# Patient Record
Sex: Female | Born: 1960 | Race: White | Hispanic: No | Marital: Married | Smoking: Never smoker
Health system: Southern US, Community
[De-identification: ages and names within clinical notes are randomized; demographics above are authoritative.]

## PROBLEM LIST (undated history)

## (undated) DIAGNOSIS — I4719 Other supraventricular tachycardia: Secondary | ICD-10-CM

## (undated) DIAGNOSIS — Z91018 Allergy to other foods: Secondary | ICD-10-CM

## (undated) DIAGNOSIS — M549 Dorsalgia, unspecified: Secondary | ICD-10-CM

## (undated) DIAGNOSIS — S060X9A Concussion with loss of consciousness of unspecified duration, initial encounter: Secondary | ICD-10-CM

## (undated) DIAGNOSIS — G43909 Migraine, unspecified, not intractable, without status migrainosus: Secondary | ICD-10-CM

## (undated) DIAGNOSIS — I341 Nonrheumatic mitral (valve) prolapse: Secondary | ICD-10-CM

## (undated) DIAGNOSIS — G9332 Myalgic encephalomyelitis/chronic fatigue syndrome: Secondary | ICD-10-CM

## (undated) DIAGNOSIS — Z9889 Other specified postprocedural states: Secondary | ICD-10-CM

## (undated) DIAGNOSIS — I2510 Atherosclerotic heart disease of native coronary artery without angina pectoris: Secondary | ICD-10-CM

## (undated) DIAGNOSIS — R112 Nausea with vomiting, unspecified: Secondary | ICD-10-CM

## (undated) DIAGNOSIS — I8390 Asymptomatic varicose veins of unspecified lower extremity: Secondary | ICD-10-CM

## (undated) DIAGNOSIS — R51 Headache: Secondary | ICD-10-CM

## (undated) DIAGNOSIS — F32A Depression, unspecified: Secondary | ICD-10-CM

## (undated) DIAGNOSIS — R55 Syncope and collapse: Secondary | ICD-10-CM

## (undated) DIAGNOSIS — G4700 Insomnia, unspecified: Secondary | ICD-10-CM

## (undated) DIAGNOSIS — M79606 Pain in leg, unspecified: Secondary | ICD-10-CM

## (undated) DIAGNOSIS — K589 Irritable bowel syndrome without diarrhea: Secondary | ICD-10-CM

## (undated) DIAGNOSIS — R109 Unspecified abdominal pain: Secondary | ICD-10-CM

## (undated) DIAGNOSIS — N3010 Interstitial cystitis (chronic) without hematuria: Secondary | ICD-10-CM

## (undated) DIAGNOSIS — M797 Fibromyalgia: Secondary | ICD-10-CM

## (undated) DIAGNOSIS — M542 Cervicalgia: Secondary | ICD-10-CM

## (undated) DIAGNOSIS — F419 Anxiety disorder, unspecified: Secondary | ICD-10-CM

## (undated) DIAGNOSIS — J069 Acute upper respiratory infection, unspecified: Secondary | ICD-10-CM

## (undated) DIAGNOSIS — M79673 Pain in unspecified foot: Secondary | ICD-10-CM

## (undated) DIAGNOSIS — E739 Lactose intolerance, unspecified: Secondary | ICD-10-CM

## (undated) DIAGNOSIS — K5900 Constipation, unspecified: Secondary | ICD-10-CM

## (undated) DIAGNOSIS — F329 Major depressive disorder, single episode, unspecified: Secondary | ICD-10-CM

## (undated) DIAGNOSIS — K219 Gastro-esophageal reflux disease without esophagitis: Secondary | ICD-10-CM

## (undated) DIAGNOSIS — R110 Nausea: Secondary | ICD-10-CM

## (undated) DIAGNOSIS — G2581 Restless legs syndrome: Secondary | ICD-10-CM

## (undated) DIAGNOSIS — K5190 Ulcerative colitis, unspecified, without complications: Secondary | ICD-10-CM

## (undated) DIAGNOSIS — I493 Ventricular premature depolarization: Secondary | ICD-10-CM

## (undated) DIAGNOSIS — R5382 Chronic fatigue, unspecified: Secondary | ICD-10-CM

## (undated) HISTORY — DX: Ulcerative colitis, unspecified, without complications: K51.90

## (undated) HISTORY — DX: Constipation, unspecified: K59.00

## (undated) HISTORY — DX: Unspecified abdominal pain: R10.9

## (undated) HISTORY — PX: NASAL SEPTUM SURGERY: SHX37

## (undated) HISTORY — DX: Acute upper respiratory infection, unspecified: J06.9

## (undated) HISTORY — DX: Gastro-esophageal reflux disease without esophagitis: K21.9

## (undated) HISTORY — DX: Syncope and collapse: R55

## (undated) HISTORY — DX: Allergy to other foods: Z91.018

## (undated) HISTORY — DX: Fibromyalgia: M79.7

## (undated) HISTORY — DX: Pain in leg, unspecified: M79.606

## (undated) HISTORY — DX: Myalgic encephalomyelitis/chronic fatigue syndrome: G93.32

## (undated) HISTORY — DX: Cervicalgia: M54.2

## (undated) HISTORY — PX: LASIK: SHX215

## (undated) HISTORY — DX: Other supraventricular tachycardia: I47.19

## (undated) HISTORY — DX: Pain in unspecified foot: M79.673

## (undated) HISTORY — DX: Nausea: R11.0

## (undated) HISTORY — DX: Chronic fatigue, unspecified: R53.82

## (undated) HISTORY — DX: Lactose intolerance, unspecified: E73.9

## (undated) HISTORY — DX: Asymptomatic varicose veins of unspecified lower extremity: I83.90

## (undated) HISTORY — PX: ESOPHAGOGASTRODUODENOSCOPY ENDOSCOPY: SHX5814

## (undated) HISTORY — DX: Nonrheumatic mitral (valve) prolapse: I34.1

## (undated) HISTORY — PX: BLADDER SURGERY: SHX569

## (undated) HISTORY — DX: Ventricular premature depolarization: I49.3

## (undated) HISTORY — DX: Dorsalgia, unspecified: M54.9

## (undated) HISTORY — PX: REDUCTION MAMMAPLASTY: SUR839

## (undated) HISTORY — DX: Concussion with loss of consciousness of unspecified duration, initial encounter: S06.0X9A

## (undated) HISTORY — DX: Migraine, unspecified, not intractable, without status migrainosus: G43.909

## (undated) HISTORY — DX: Atherosclerotic heart disease of native coronary artery without angina pectoris: I25.10

## (undated) HISTORY — PX: VASCULAR SURGERY: SHX849

## (undated) HISTORY — DX: Insomnia, unspecified: G47.00

---

## 1998-05-03 ENCOUNTER — Ambulatory Visit (HOSPITAL_BASED_OUTPATIENT_CLINIC_OR_DEPARTMENT_OTHER): Admission: RE | Admit: 1998-05-03 | Discharge: 1998-05-03 | Payer: Self-pay

## 1999-08-22 ENCOUNTER — Encounter: Admission: RE | Admit: 1999-08-22 | Discharge: 1999-08-22 | Payer: Self-pay | Admitting: Gynecology

## 1999-08-22 ENCOUNTER — Encounter: Payer: Self-pay | Admitting: Gynecology

## 2001-08-16 ENCOUNTER — Other Ambulatory Visit: Admission: RE | Admit: 2001-08-16 | Discharge: 2001-08-16 | Payer: Self-pay | Admitting: Obstetrics and Gynecology

## 2001-12-11 ENCOUNTER — Encounter: Payer: Self-pay | Admitting: Obstetrics and Gynecology

## 2001-12-11 ENCOUNTER — Encounter: Admission: RE | Admit: 2001-12-11 | Discharge: 2001-12-11 | Payer: Self-pay | Admitting: Obstetrics and Gynecology

## 2002-08-20 ENCOUNTER — Other Ambulatory Visit: Admission: RE | Admit: 2002-08-20 | Discharge: 2002-08-20 | Payer: Self-pay | Admitting: Obstetrics and Gynecology

## 2003-05-13 ENCOUNTER — Encounter: Admission: RE | Admit: 2003-05-13 | Discharge: 2003-05-13 | Payer: Self-pay | Admitting: Obstetrics and Gynecology

## 2003-09-11 ENCOUNTER — Other Ambulatory Visit: Admission: RE | Admit: 2003-09-11 | Discharge: 2003-09-11 | Payer: Self-pay | Admitting: Obstetrics and Gynecology

## 2004-05-30 ENCOUNTER — Ambulatory Visit (HOSPITAL_COMMUNITY): Admission: RE | Admit: 2004-05-30 | Discharge: 2004-05-30 | Payer: Self-pay | Admitting: Obstetrics and Gynecology

## 2004-12-05 ENCOUNTER — Other Ambulatory Visit: Admission: RE | Admit: 2004-12-05 | Discharge: 2004-12-05 | Payer: Self-pay | Admitting: Obstetrics and Gynecology

## 2005-06-30 ENCOUNTER — Encounter: Admission: RE | Admit: 2005-06-30 | Discharge: 2005-06-30 | Payer: Self-pay | Admitting: Neurology

## 2006-01-18 ENCOUNTER — Other Ambulatory Visit: Admission: RE | Admit: 2006-01-18 | Discharge: 2006-01-18 | Payer: Self-pay | Admitting: Obstetrics & Gynecology

## 2006-01-18 ENCOUNTER — Ambulatory Visit (HOSPITAL_COMMUNITY): Admission: RE | Admit: 2006-01-18 | Discharge: 2006-01-18 | Payer: Self-pay | Admitting: Obstetrics and Gynecology

## 2006-01-24 ENCOUNTER — Encounter: Admission: RE | Admit: 2006-01-24 | Discharge: 2006-01-24 | Payer: Self-pay | Admitting: Obstetrics and Gynecology

## 2006-08-08 ENCOUNTER — Ambulatory Visit: Payer: Self-pay | Admitting: Internal Medicine

## 2006-08-09 ENCOUNTER — Ambulatory Visit: Payer: Self-pay | Admitting: Professional

## 2007-02-05 ENCOUNTER — Other Ambulatory Visit: Admission: RE | Admit: 2007-02-05 | Discharge: 2007-02-05 | Payer: Self-pay | Admitting: Obstetrics & Gynecology

## 2008-03-25 ENCOUNTER — Ambulatory Visit (HOSPITAL_COMMUNITY): Admission: RE | Admit: 2008-03-25 | Discharge: 2008-03-25 | Payer: Self-pay | Admitting: Obstetrics and Gynecology

## 2008-03-25 ENCOUNTER — Other Ambulatory Visit: Admission: RE | Admit: 2008-03-25 | Discharge: 2008-03-25 | Payer: Self-pay | Admitting: Obstetrics & Gynecology

## 2009-09-21 ENCOUNTER — Encounter: Payer: Self-pay | Admitting: Pulmonary Disease

## 2009-10-05 DIAGNOSIS — Z87898 Personal history of other specified conditions: Secondary | ICD-10-CM | POA: Insufficient documentation

## 2009-10-05 DIAGNOSIS — F329 Major depressive disorder, single episode, unspecified: Secondary | ICD-10-CM | POA: Insufficient documentation

## 2009-10-05 DIAGNOSIS — J309 Allergic rhinitis, unspecified: Secondary | ICD-10-CM | POA: Insufficient documentation

## 2009-10-05 DIAGNOSIS — N3010 Interstitial cystitis (chronic) without hematuria: Secondary | ICD-10-CM | POA: Insufficient documentation

## 2009-10-05 DIAGNOSIS — F3289 Other specified depressive episodes: Secondary | ICD-10-CM | POA: Insufficient documentation

## 2009-10-05 DIAGNOSIS — G4700 Insomnia, unspecified: Secondary | ICD-10-CM | POA: Insufficient documentation

## 2009-10-05 DIAGNOSIS — I8390 Asymptomatic varicose veins of unspecified lower extremity: Secondary | ICD-10-CM | POA: Insufficient documentation

## 2009-10-06 ENCOUNTER — Ambulatory Visit: Payer: Self-pay | Admitting: Pulmonary Disease

## 2009-10-06 DIAGNOSIS — G4700 Insomnia, unspecified: Secondary | ICD-10-CM | POA: Insufficient documentation

## 2009-10-06 DIAGNOSIS — F518 Other sleep disorders not due to a substance or known physiological condition: Secondary | ICD-10-CM | POA: Insufficient documentation

## 2009-11-16 ENCOUNTER — Encounter: Payer: Self-pay | Admitting: Pulmonary Disease

## 2009-11-16 ENCOUNTER — Ambulatory Visit (HOSPITAL_BASED_OUTPATIENT_CLINIC_OR_DEPARTMENT_OTHER): Admission: RE | Admit: 2009-11-16 | Discharge: 2009-11-16 | Payer: Self-pay | Admitting: Pulmonary Disease

## 2009-11-20 ENCOUNTER — Ambulatory Visit: Payer: Self-pay | Admitting: Pulmonary Disease

## 2009-11-23 ENCOUNTER — Telehealth (INDEPENDENT_AMBULATORY_CARE_PROVIDER_SITE_OTHER): Payer: Self-pay | Admitting: *Deleted

## 2009-12-03 ENCOUNTER — Ambulatory Visit: Payer: Self-pay | Admitting: Pulmonary Disease

## 2009-12-07 ENCOUNTER — Encounter: Admission: RE | Admit: 2009-12-07 | Discharge: 2009-12-07 | Payer: Self-pay | Admitting: Neurology

## 2009-12-11 DIAGNOSIS — G2589 Other specified extrapyramidal and movement disorders: Secondary | ICD-10-CM | POA: Insufficient documentation

## 2009-12-11 DIAGNOSIS — G4761 Periodic limb movement disorder: Secondary | ICD-10-CM | POA: Insufficient documentation

## 2009-12-29 ENCOUNTER — Telehealth (INDEPENDENT_AMBULATORY_CARE_PROVIDER_SITE_OTHER): Payer: Self-pay | Admitting: *Deleted

## 2010-01-31 ENCOUNTER — Telehealth (INDEPENDENT_AMBULATORY_CARE_PROVIDER_SITE_OTHER): Payer: Self-pay | Admitting: *Deleted

## 2010-05-11 ENCOUNTER — Ambulatory Visit: Payer: Self-pay | Admitting: Pulmonary Disease

## 2010-05-19 ENCOUNTER — Ambulatory Visit (HOSPITAL_COMMUNITY)
Admission: RE | Admit: 2010-05-19 | Discharge: 2010-05-19 | Payer: Self-pay | Source: Home / Self Care | Attending: Obstetrics and Gynecology | Admitting: Obstetrics and Gynecology

## 2010-06-04 ENCOUNTER — Encounter: Payer: Self-pay | Admitting: Obstetrics and Gynecology

## 2010-06-05 ENCOUNTER — Encounter: Payer: Self-pay | Admitting: Obstetrics and Gynecology

## 2010-06-16 NOTE — Assessment & Plan Note (Signed)
Summary: consult for insomnia, sleep issues.   Copy to:  Meredith Staggers (psychiatry) Primary Provider/Referring Provider:  Fara Chute  CC:  Sleep Consult.  History of Present Illness: the patient is a 50 year old female who I have been asked to see for significant sleep issues. The patient has underlying major depression and also generalized anxiety disorder, and gives a history for ongoing sleep issues for the last 20 years. She describes her problem as difficulty with sleep onset and maintenance, and feels that it is getting worse. She has tried various behavioral therapies in the past, but has never been treated with formal cognitive behavioral therapy. She has been tried on numerous sleeping medications without success. She currently is on trazodone 50-100 mg at bedtime. She will typically go to bed between 9 and 10 PM, but either watches television in bed or reads in bed until she is able to fall asleep. That may take anywhere from 15 minutes to 2 hours. Once asleep, she will have one to multiple awakenings, and it may take 15 minutes to hours to fall back asleep. She describes various cycles in which she would sleep better for a period of time, followed by worsening. Her husband states that she definitely snores, and she can awaken herself with the snoring. She has also been noted to have kicking according to the husband, and describes symptoms consistent with restless leg syndrome in the evening before bed. She has been treated for RLS in the past, and she is not sure why she stopped taking the medication. She awakens at 6 AM to start her day, and does not feel rested. She does not have any caffeine intake during the day, but does have severe fatigue throughout the day. She does not describe classic sleepiness except at various times. She can however, fall asleep with television in the evening, and her husband may go to bed and leave her sleeping on the sofa. On the weekend she spends all day in bed,  and feels that she only sleeps short periods. The patient admits to having a sense of frustration about going to sleep, and feels that her mind is racing and keeps her from initiating sleep.  Preventive Screening-Counseling & Management  Alcohol-Tobacco     Smoking Status: never  Current Medications (verified): 1)  Ambi 40pse/400gfn 40-400 Mg Tabs (Pseudoephedrine-Guaifenesin) .... Take 1/2 To 1 Tab By Mouth As Directed 2)  Fluticasone Propionate 50 Mcg/act Susp (Fluticasone Propionate) .... 2 Sprays in Each Nostril Once Daily 3)  Doxycycline Hyclate 100 Mg Caps (Doxycycline Hyclate) .... Take 1 Tablet By Mouth Once A Day As Directed By Dermatologist 4)  Zyrtec Allergy 10 Mg Tabs (Cetirizine Hcl) .... Take 1 Tab By Mouth At Bedtime 5)  Vitamin D2 1.25 .... Take Once A Week 6)  Elmiron 100 Mg Caps (Pentosan Polysulfate Sodium) .... Take 1 Tablet By Mouth Two Times A Day 7)  Fluoxetine Hcl 10 Mg Caps (Fluoxetine Hcl) .... Take 1 Tablet By Mouth Once A Day 8)  Junel Birth Control Pill .... Use As Directed 9)  Percocet 10-325 Mg Tabs (Oxycodone-Acetaminophen) .... Take By Mouth As Needed For Migraines 10)  Migranal 4 Mg/ml Soln (Dihydroergotamine Mesylate) .... Use Spray As Directed As Needed For Migraines  Allergies (verified): No Known Drug Allergies  Past History:  Past Medical History:  generalized anxiety disorder VARICOSE VEINS, LOWER EXTREMITIES (ICD-454.9) MIGRAINES, HX OF (ICD-V13.8) INTERSTITIAL CYSTITIS (ICD-595.1) INSOMNIA UNSPECIFIED (ICD-780.52) DEPRESSION (ICD-311) ALLERGIC RHINITIS (ICD-477.9)  Past Surgical History: Sinus surgery  2007 and 02/2009  Family History: Reviewed history from 10/05/2009 and no changes required. Family History Depression---father Sleep problems---father Anxiety---mother allergies: father heart disease: mother   Social History: Reviewed history and no changes required. Patient never smoked.  pt is married. pt has children- 2  sons pt works as a Advice worker. Smoking Status:  never  Review of Systems       The patient complains of weight change, headaches, nasal congestion/difficulty breathing through nose, anxiety, depression, and joint stiffness or pain.  The patient denies shortness of breath with activity, shortness of breath at rest, productive cough, non-productive cough, coughing up blood, chest pain, irregular heartbeats, acid heartburn, indigestion, loss of appetite, abdominal pain, difficulty swallowing, sore throat, tooth/dental problems, sneezing, itching, ear ache, hand/feet swelling, rash, change in color of mucus, and fever.    Vital Signs:  Patient profile:   50 year old female Height:      65.5 inches Weight:      165 pounds BMI:     27.14 O2 Sat:      99 % on Room air Temp:     98.6 degrees F oral Pulse rate:   77 / minute BP sitting:   100 / 64  (left arm) Cuff size:   regular  Vitals Entered By: Arman Filter LPN (Oct 06, 2009 3:09 PM)  O2 Flow:  Room air CC: Sleep Consult Comments Medications reviewed with patient Arman Filter LPN  Oct 06, 2009 3:26 PM    Physical Exam  General:  overweight female in no acute distress Eyes:  PERRLA and EOMI.   Nose:  patent without discharge Mouth:  mild elongation of the soft palate and uvula Neck:  no JVD, thyromegaly, or lymphadenopathy area and Lungs:  totally clear to auscultation Heart:  regular rate and rhythm, no MRG Abdomen:  soft and nontender, bowel sounds present Extremities:  no edema or cyanosis noted. Pulses intact distally Neurologic:  alert and oriented, does not appear overly sleepy, moves all 4 extremities.   Impression & Recommendations:  Problem # 1:  PERSISTENT DISORDER INITIATING/MAINTAINING SLEEP (ICD-307.42) the patient has significant sleep onset and maintenance insomnia but I suspect is multifactorial. She has underlying major depression and generalized anxiety disorder, has significant sleep hygiene issues,  has a definite component of psychophysiologic insomnia, and the question remains whether she has some type of sleep disorder that is contributing to the problem. She does have a history of snoring with arousals, but doesn't have other history that is supportive of this. It does sound like she has the restless leg syndrome, and may benefit from treatment. She is very interested in ruling out other sleep disorders outside of insomnia, and feels that she can sleep long enough to get an adequate sleep study. I have had a long discussion with her regarding stimulus control therapy, and have asked her to try this in the interim. I have gone over this with her in detail. If she continues to have issues, I would recommend that she have cognitive behavioral therapy with someone who is trained in this discipline.  Problem # 2:  INADEQUATE SLEEP HYGIENE (ICD-307.49) the patient has developed a lot of bad habits which are contributing to her sleep issues. She is reading and watching TV in bed while trying to go to sleep, she is staying in bed despite the inability to sleep, she is catnapping during the day and in the evening at times, and is staying in bed throughout the day on the  weekends. I have gone over the basics of a good sleep hygiene regimen with her.  Medications Added to Medication List This Visit: 1)  Ambi 40pse/400gfn 40-400 Mg Tabs (Pseudoephedrine-guaifenesin) .... Take 1/2 to 1 tab by mouth as directed 2)  Fluticasone Propionate 50 Mcg/act Susp (Fluticasone propionate) .... 2 sprays in each nostril once daily 3)  Doxycycline Hyclate 100 Mg Caps (Doxycycline hyclate) .... Take 1 tablet by mouth once a day as directed by dermatologist 4)  Zyrtec Allergy 10 Mg Tabs (Cetirizine hcl) .... Take 1 tab by mouth at bedtime 5)  Vitamin D2 1.25  .... Take once a week 6)  Elmiron 100 Mg Caps (Pentosan polysulfate sodium) .... Take 1 tablet by mouth two times a day 7)  Fluoxetine Hcl 10 Mg Caps (Fluoxetine hcl)  .... Take 1 tablet by mouth once a day 8)  Junel Birth Control Pill  .... Use as directed 9)  Percocet 10-325 Mg Tabs (Oxycodone-acetaminophen) .... Take by mouth as needed for migraines 10)  Migranal 4 Mg/ml Soln (Dihydroergotamine mesylate) .... Use spray as directed as needed for migraines  Other Orders: Consultation Level V (16109) Sleep Disorder Referral (Sleep Disorder)  Patient Instructions: 1)  will set up for sleep study to evaluate for sleep apnea or restless legs. 2)  try stimulus control therapy as discussed with you. 3)  work on sleep hygiene as discussed with you. 4)  will see you back after sleep study to check on progress.

## 2010-06-16 NOTE — Progress Notes (Signed)
Summary: requip rx   Phone Note Call from Patient Call back at [phone] ex [phone]   Caller: Patient Call For: clance Summary of Call: calling for prescript for ropinirole. she states it is working well [facility] [phone] Initial call taken by: Rickard Patience,  January 31, 2010 9:49 AM  Follow-up for Phone Call        Per phone note from 8.17.11, mirapex was changed to requip -- pt to call back in 3 wks with report.  ATC above number - [facility] Crystal Yetta Barre RN  January 31, 2010 10:21 AM   pt states requip is working very well, she is not having any more leg symptoms and is requesting a refill on requip. Carron Curie CMA  January 31, 2010 11:20 AM   Additional Follow-up for Phone Call Additional follow up Details #1::        ok to call in prescription for: requip 0.5mg  2 after dinner each night...88fills. have her followup with me in about 2mos. Additional Follow-up by: Barbaraann Share MD,  January 31, 2010 4:45 PM    Additional Follow-up for Phone Call Additional follow up Details #2::    ATC pt at alternate #.  NA and unable to leave message.  Therefore called pt at home # and [facility].  Aundra Millet Reynolds LPN  January 31, 2010 4:59 PM   Called, spoke with pt.  She is aware requip rx sent to [facility] in [place].  2 month follow up scheduled with KC for 11.18.11 at 3:45 pm -- pt aware.  Follow-up by: Gweneth Dimitri RN,  February 01, 2010 9:57 AM  Prescriptions: ROPINIROLE HCL 0.5 MG TABS (ROPINIROLE HCL) 2 after dinner each night  #60 x 6   Entered by:   Gweneth Dimitri RN   Authorized by:   Barbaraann Share MD   Signed by:   Gweneth Dimitri RN on 02/01/2010   Method used:   Electronically to        [facility] # [phone]* (retail)       [address]       [place], [place]  [address]       Ph: [phone] or [phone]       Fax: [phone]   RxID:   [id number]

## 2010-06-16 NOTE — Progress Notes (Signed)
Summary: need to sched ov with kc -pt returned call  Phone Note Outgoing Call Call back at [facility] Phone [phone]   Call placed by: Arman Filter LPN,  November 23, 2009 4:09 PM Call placed to: Patient Summary of Call: pt needs ov with kc to discuss sleep study results.[name]  [email or web address].  Aundra Millet Analicia Skibinski LPN  November 23, 2009 4:09 PM   Returning call, can be reached [phone]. Darletta Moll  November 25, 2009 2:24 PM  Initial call taken by: Arman Filter LPN,  November 23, 2009 4:09 PM  Follow-up for Phone Call        [facility]  KC's schedule is VERY full next week.  Only opening I have for her at this time is 12-03-2009 at 4:15pm.  If that doesn't work for her,  then next avail is all I can offer.  Aundra Millet Yong Wahlquist LPN  November 26, 2009 10:51 AM   Additional Follow-up for Phone Call Additional follow up Details #1::        [email or web address].  Aundra Millet Samari Gorby LPN  November 29, 2009 3:41 PM   pt returned call. she is on schedule for this fri 7/22 at 4:15. Tivis Ringer, CNA  November 30, 2009 9:53 AM

## 2010-06-16 NOTE — Consult Note (Signed)
Summary: [facility]  [facility]   Imported By: Sherian Rein 02/02/2010 09:01:48  _____________________________________________________________________  External Attachment:    Type:   Image     Comment:   External Document
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
## 2010-06-16 NOTE — Assessment & Plan Note (Signed)
Summary: f/u sleep study results- ok per megan//kp   Copy to:  Meredith Staggers (psychiatry) Primary Provider/Referring Provider:  Fara Chute  CC:  Pt is here for a f/u appt to discuss sleep study results.  .  History of Present Illness: the pt comes in today for f/u of her recent sleep study.  She was found to have no significant SDB, mild to moderate snoring, and very large numbers of leg jerks with signfiicant sleep disruption.  She had 195 PLMS, with 6/hr resulting in arousal or awakening.  I have reviewed the study with her and her husband, and answered all of their questions.  Current Medications (verified): 1)  Ambi 40pse/400gfn 40-400 Mg Tabs (Pseudoephedrine-Guaifenesin) .... Take 1/2 To 1 Tab By Mouth As Directed 2)  Zyrtec Allergy 10 Mg Tabs (Cetirizine Hcl) .... Take 1 Tab By Mouth At Bedtime 3)  Vitamin D2 1.25 .... Take Once A Week 4)  Elmiron 100 Mg Caps (Pentosan Polysulfate Sodium) .... Take 1 Tablet By Mouth Two Times A Day 5)  Junel Birth Control Pill .... Use As Directed 6)  Percocet 10-325 Mg Tabs (Oxycodone-Acetaminophen) .... Take By Mouth As Needed For Migraines 7)  Migranal 4 Mg/ml Soln (Dihydroergotamine Mesylate) .... Use Spray As Directed As Needed For Migraines 8)  Mirapex 0.125 Mg  Tabs (Pramipexole Dihydrochloride) .... As Directed 9)  Trazodone Hcl 50 Mg Tabs (Trazodone Hcl) .... Take 1 Tab By Mouth At Bedtime  Allergies (verified): No Known Drug Allergies  Review of Systems  The patient denies shortness of breath with activity, shortness of breath at rest, productive cough, non-productive cough, coughing up blood, chest pain, irregular heartbeats, acid heartburn, indigestion, loss of appetite, weight change, abdominal pain, difficulty swallowing, sore throat, tooth/dental problems, headaches, nasal congestion/difficulty breathing through nose, sneezing, itching, ear ache, anxiety, depression, hand/feet swelling, joint stiffness or pain, rash, change in color  of mucus, and fever.    Vital Signs:  Patient profile:   50 year old female Height:      65.5 inches Weight:      168.38 pounds BMI:     27.69 O2 Sat:      95 % on Room air Temp:     98.3 degrees F oral Pulse rate:   62 / minute BP sitting:   110 / 72  (left arm) Cuff size:   regular  Vitals Entered By: Arman Filter LPN (December 03, 2009 3:25 PM)  O2 Flow:  Room air CC: Pt is here for a f/u appt to discuss sleep study results.   Comments Medications reviewed with patient Arman Filter LPN  December 03, 2009 3:26 PM    Physical Exam  General:  ow female in nad Extremities:  no edema or cyanosis Neurologic:  alert, does not appear sleepy, moves all 4.   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  I would like to start her on a dopamine agonist, and see how she does.  Will check an iron panel at some point.    Problem # 2:  PERSISTENT DISORDER INITIATING/MAINTAINING SLEEP (ICD-307.42)  I have reminded the pt that her leg movements are not her only sleep issue.  She will need to continue to work on her behavioral therapies and sleep hygiene while treating her movement disorder of sleep.    Medications Added to Medication List This Visit: 1)  Mirapex  0.125 Mg Tabs (Pramipexole dihydrochloride) .... As directed 2)  Trazodone Hcl 50 Mg Tabs (Trazodone hcl) .... Take 1 tab by mouth at bedtime  Other Orders: Est. Patient Level III (16109)  Patient Instructions: 1)  continue to work on behavior modifications as we have discussed. 2)  will try mirapex 0.125mg  one after dinner each night for one week, then increase to two each night.  Let me know if issues with tolerance. 3)  please call me in 3 weeks to give update on progress.  Prescriptions: MIRAPEX 0.125 MG  TABS (PRAMIPEXOLE DIHYDROCHLORIDE) as directed  #60 x 6   Entered and Authorized  by:   Barbaraann Share MD   Signed by:   Barbaraann Share MD on 12/03/2009   Method used:   Print then Give to Patient   RxID:   [id number]

## 2010-06-16 NOTE — Letter (Signed)
Summary: [facility]  [facility]   Imported By: Lester Atmore 10/14/2009 11:54:23  _____________________________________________________________________  External Attachment:    Type:   Image     Comment:   External Document

## 2010-06-16 NOTE — Progress Notes (Signed)
Summary: change mirapex to requip 0.5mg   Call back at Home Phone [phone] Call back at Work Phone [phone] Call back at [phone] work   Caller: Patient Call For: clance Reason for Call: Talk to Nurse Summary of Call: Not doing any better, taiking Mirapex 2 at night, husband says still very restless, sleep until 12 midnight then is up every hour, take 4 topomax at night, then wake up every hour on the hour. again.  Please advise. Initial call taken by: Eugene Gavia,  December 29, 2009 9:53 AM  Follow-up for Phone Call       Follow-up by: Barbaraann Share MD,  December 29, 2009 5:33 PM    Additional Follow-up for Phone Call Additional follow up Details #2::    called and talked with pt.  She is still having leg symptoms despite taking mirapex.  Will try requip in its place.  She has multiple sleep issues that need to be addressed by psychiatry, ?neurology  please ask pt to stop mirapex and start requip 0.5mg   2 after dinner each night.  #40, no fills.  call me with progress in 3 weeks. Follow-up by: Barbaraann Share MD,  December 29, 2009 5:35 PM  Additional Follow-up for Phone Call Additional follow up Details #3:: Details for Additional Follow-up Action Taken: [facility] at work number-was unavailable at the time.Reynaldo Minium CMA  December 30, 2009 8:56 AM   lm at wk # and other number listed wk the number listed as home just gave a fast busy signal   Philipp Deputy CMA  December 30, 2009 11:16 AM   called home #, LMOM TCB.  called 1st work #, LMOM TCB.  called the 3rd # provided, was told to call [phone].  called this # and was able to speak with patient.  she verified that she has indeed spoken with Dr. Shelle Iron but i reiterated the dosing instructions for the requip.  rx sent to pt's verified pharmacy.  pt verbalized her understanding and will call back in 3weeks' time with progress report.  pt also aware to disregard the messages i left on the other 2 numbers. Boone Master CNA/MA  December 31, 2009 3:39 PM   New/Updated Medications: ROPINIROLE HCL 0.5 MG TABS (ROPINIROLE HCL) 2 after dinner each night Prescriptions: ROPINIROLE HCL 0.5 MG TABS (ROPINIROLE HCL) 2 after dinner each night  #40 x 0   Entered by:   Boone Master CNA/MA   Authorized by:   Barbaraann Share MD   Signed by:   Boone Master CNA/MA on 12/31/2009   Method used:   Electronically to        [facility] # [phone]* (retail)       [address]       [place], [place]  [address]       Ph: [phone] or [phone]       Fax: [phone]   RxID:   [id number]

## 2010-08-30 ENCOUNTER — Other Ambulatory Visit: Payer: Self-pay | Admitting: Pulmonary Disease

## 2010-09-30 ENCOUNTER — Telehealth: Payer: Self-pay | Admitting: Pulmonary Disease

## 2010-09-30 NOTE — Assessment & Plan Note (Signed)
[facility] [place] [facility]                             PULMONARY OFFICE NOTE   Marisa Hamilton, Marisa Hamilton                        MRN:          [id number]  DATE:08/08/2006                            DOB:          20-Dec-1960    NEW SLEEP MEDICINE CONSULTATION.   PROBLEM:  A 50 year old woman referred through the courtesy of Dr.  Julieta Hamilton office, in Sleep Medicine consultation, complaining of  inability to initiate and maintain sleep.   HISTORY:  She says beginning about 19 years ago with pregnancy a  previously unremarkable sleep pattern became difficult with chronic  insomnia.  She had difficulty initiating sleep and difficulty staying  asleep with a tendency to wake up several times during the night, unable  to get back to sleep again.  She feels that her mind will not turn off.  Husband is with her and comments that she likes to leave the t.v.  She  seems to use it as a background noise.  For about 8 years she has tried  multiple medications.  Most recently she is using trazodone 50 mg, now  reduced to 25 mg at h.s.  This helps her maintain sleep at night but  gives morning hangover.  She feels unrested.  Most recently she tried  Rozerem and says she sleeps well with that but feels sleepy and out of  touch all the next day.  She says she is not able to make time to nap.  Usual bedtime is between 9 and 10 p.m., estimating 30 minute sleep onset  and waking 3-5 times during the night before finally up at 6 a.m.  Her  husband says she snores a little occasionally and may twitch or jerk  occasionally.  Neither of these seems to be a big problem.   MEDICATION:  1. Yaz.  2. Elmiron.  3. Prozac 10 mg.  4. Topamax 50 mg at h.s.  5. Trazodone 25 mg.  6. Rozerem on weekends.   NO MEDICATION ALLERGY.   REVIEW OF SYSTEMS:  Weight has gone up and down, saying she gained 25  pounds and lost 10 in the past 2 years.   No tobacco, occasional social alcohol, no caffeine at all,  she is  married with children.  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  She may sleep a little better making this change  and has much less frequent migraines.   FAMILY HISTORY:  1. Father had depression and sleep problems similar to herself.  2. Son also has a similar difficulty with light sleep, easily      disturbed.  3. Mother is described as anxious.   REVIEW OF SYSTEMS:  Snoring, difficulty initiating sleep, some creeping  in legs before going to sleep, daytime sleepiness if inactive, leg or  body jerks, waking fatigue.  She says here that she takes nap in the  day, but when directly asked she says she does not.  Vivid dreams which  she describes  as always unpleasant.  Depression.  Bruxism.  She feels  physically comfortable in bed.  She is not menopausal.   PAST HISTORY:  1. Thyroid tested normal.  2. Previous allergic rhinitis.  3. Migraine.  4. Sinus surgery.  5. Interstitial cystitis.  6. Depression.  7. Varicose veins.  8. Difficultly concentrating.  9. Menopausal hot flashes.   OBJECTIVE:  Weight 145 pounds, BP 104/62, pulse regularly 79, room air  saturation 100%.  She appears as a trim, alert, pleasant woman  appropriately interactive.  SKIN:  No obvious rash.  ADENOPATHY:  None seen.  HEENT:  Eyes, nose and throat are clear, palate spacing 2/4, voice  quality is normal, she can breathe comfortably through her nose with her  mouth closed, there is no thyromegaly or neck vein distention.  CHEST:  Quiet, clear, unlabored breathing.  HEART SOUNDS:  Regular without murmur or gallop.  ABDOMEN:  No hepatosplenomegaly.  EXTREMITIES:  No cyanosis, clubbing, edema or tremor.  NEUROLOGIC:  Unremarkable to observation.   IMPRESSION:  1. Chronic nonspecific insomnia possibly related to a postpartum      depression.  I do not get a history suggesting that here is an       organic event such as sleep apnea or leg movements interfering with      sleep, but she is not physically active and there may be some room      to improve her sleep hygiene.  2. I am concerned that depression may be a bigger problem than she      realizes, including the disturbing dream content and her family      history.   PLAN:  1. She found Rozerem 8 mg to be associated with a daytime carryover,      which I would not have expected with this medication.  She is going      to see what happens if she splits it into a 4 mg pill at h.s.  2. Sleep hygiene discussion.  3. She is referred to a web site for cognitive behavioral therapy.  4. I have asked her to try to get more regular exercise.  5. Referral to Dr. Cheryln Manly and the [facility] for      evaluation of depression and insomnia.  6. Schedule return in 1 month, earlier p.r.n.   I appreciate the chance to meet her.     Marisa D. Annamaria Boots, MD, [place], [place]  Electronically Signed    CDY/MedQ  DD: 08/08/2006  DT: 08/08/2006  Job #: [id number]   cc:   Marisa Hamilton, M.D.  Marisa Hamilton

## 2010-09-30 NOTE — Telephone Encounter (Signed)
[id number] x1 

## 2010-09-30 NOTE — Letter (Signed)
August 09, 2006    [name], MD, [place], [facility]-Pulmonary Dept  [address], [address]  [place] [address]   RE:  Marisa Hamilton, Marisa Hamilton  MRN:  [id number]  /  DOB:  06-06-1960   On August 09, 2006, the above-named patient was seen in behavioral  medicine for evaluation and ongoing psychotherapy.  Thank you for your  referral and continued interest in behavioral medicine.    Sincerely,     ______________________________  Sidney Ace, PhD  Electronically Signed    CG/MedQ  DD: 08/10/2006  DT: 08/10/2006  Job #: [id number]

## 2010-10-03 NOTE — Telephone Encounter (Signed)
LMOMTCB

## 2010-10-04 NOTE — Telephone Encounter (Signed)
I saw her in FEB and she was on requip and doing well.  Therefore unlikely requip is the culprit here.  If she is convinced it is the requip, ok to change to mirapex at .25 mg a night ( 2 of the 0.125mg  each night)

## 2010-10-04 NOTE — Telephone Encounter (Signed)
Spoke with pt. She states having swelling in her feet since she has been on requip. She has stopped requip and started back on mirapex that she had left over from before- taking 0.125 mg 2 at hs. States that the swelling has resolved and not having any problems with this med. Wants to know if this is okay with KC. Pls advise thanks!

## 2010-10-04 NOTE — Telephone Encounter (Signed)
PATIENT RETURNED CALL FROM YESTERDAY.  PLEASE CALL BACK AT [phone].

## 2010-10-04 NOTE — Telephone Encounter (Signed)
[id number] at both numbers given. Carron Curie, CMA

## 2010-10-05 MED ORDER — PRAMIPEXOLE DIHYDROCHLORIDE 0.125 MG PO TABS: ORAL_TABLET | ORAL | Status: DC

## 2010-10-05 NOTE — Telephone Encounter (Signed)
Called both #s provided - [facility]

## 2010-10-05 NOTE — Telephone Encounter (Signed)
Called [phone] - LMOMTCB

## 2010-10-05 NOTE — Telephone Encounter (Signed)
Advised caller of KC recs and pt states feet became very swollen while on Requip and the swelling went down once she changed back to the Mirapex she had remaining. New rx sent to pharmacy for Mirapex 0.125

## 2010-10-05 NOTE — Telephone Encounter (Signed)
PATIENT RETURNED CALL.  PLEASE CALL BACK AT [phone].

## 2011-02-05 ENCOUNTER — Other Ambulatory Visit: Payer: Self-pay | Admitting: Pulmonary Disease

## 2011-03-27 ENCOUNTER — Other Ambulatory Visit: Payer: Self-pay | Admitting: Pulmonary Disease

## 2011-04-18 ENCOUNTER — Ambulatory Visit (INDEPENDENT_AMBULATORY_CARE_PROVIDER_SITE_OTHER): Payer: PRIVATE HEALTH INSURANCE | Admitting: Pulmonary Disease

## 2011-04-18 ENCOUNTER — Encounter: Payer: Self-pay | Admitting: Pulmonary Disease

## 2011-04-18 DIAGNOSIS — G2589 Other specified extrapyramidal and movement disorders: Secondary | ICD-10-CM

## 2011-04-18 DIAGNOSIS — G4700 Insomnia, unspecified: Secondary | ICD-10-CM

## 2011-04-18 MED ORDER — PRAMIPEXOLE DIHYDROCHLORIDE 0.125 MG PO TABS: ORAL_TABLET | ORAL | Status: DC

## 2011-04-18 NOTE — Assessment & Plan Note (Signed)
The patient is doing very well from this standpoint on her current dose of Mirapex.  I have asked her to continue on this.

## 2011-04-18 NOTE — Progress Notes (Signed)
  Subjective:    Patient ID: Marisa Hamilton, female    DOB: [birth year], 50 y.o.   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  She has been doing well on Mirapex with respect to her leg movements, and she is also doing fairly well with trazodone.  She is not having any significant issues with sleep onset or maintenance currently.  However, she does feel a little groggy the next morning after taking the trazodone.   Review of Systems  Constitutional: Negative for fever and unexpected weight change.  HENT: Negative for ear pain, nosebleeds, congestion, sore throat, rhinorrhea, sneezing, trouble swallowing, dental problem, postnasal drip and sinus pressure.   Eyes: Negative for redness and itching.  Respiratory: Negative for cough, chest tightness, shortness of breath and wheezing.   Cardiovascular: Negative for palpitations and leg swelling.  Gastrointestinal: Negative for nausea and vomiting.  Genitourinary: Negative for dysuria.  Musculoskeletal: Negative for joint swelling.  Skin: Negative for rash.  Neurological: Positive for headaches.  Hematological: Does not bruise/bleed easily.  Psychiatric/Behavioral: Negative for dysphoric mood. The patient is not nervous/anxious.        Objective:   Physical Exam Well-developed female in no acute distress Nose without purulence or discharge noted Lower extremities without edema, no cyanosis Alert, does not appear to be excessively sleepy, moves all 4 extremities.       Assessment & Plan:

## 2011-04-18 NOTE — Assessment & Plan Note (Signed)
The patient has been doing fairly well on trazodone, but feels that she is a little "hung over" the next morning at this dose.  Since she is doing so well, I have asked her to try and slowly bring this down over time.  Also stressed to her the need to stick with her good sleep hygiene.

## 2011-04-18 NOTE — Patient Instructions (Signed)
Continue on mirapex as you are doing. Can try to slowly taper trazodone on your own timetable as we discussed. Continue with good sleep hygiene, making sleep a high priority followup with me in one year.

## 2011-06-07 ENCOUNTER — Other Ambulatory Visit: Payer: Self-pay | Admitting: Obstetrics and Gynecology

## 2011-06-07 DIAGNOSIS — Z1231 Encounter for screening mammogram for malignant neoplasm of breast: Secondary | ICD-10-CM

## 2011-07-21 ENCOUNTER — Ambulatory Visit (HOSPITAL_COMMUNITY)
Admission: RE | Admit: 2011-07-21 | Discharge: 2011-07-21 | Disposition: A | Discharge status: Home / Self Care | Payer: PRIVATE HEALTH INSURANCE | Source: Ambulatory Visit | Attending: Obstetrics and Gynecology | Admitting: Obstetrics and Gynecology

## 2011-07-21 DIAGNOSIS — Z1231 Encounter for screening mammogram for malignant neoplasm of breast: Secondary | ICD-10-CM | POA: Insufficient documentation

## 2011-07-25 ENCOUNTER — Other Ambulatory Visit: Payer: Self-pay | Admitting: Obstetrics and Gynecology

## 2011-07-25 DIAGNOSIS — R928 Other abnormal and inconclusive findings on diagnostic imaging of breast: Secondary | ICD-10-CM

## 2011-08-02 ENCOUNTER — Ambulatory Visit
Admission: RE | Admit: 2011-08-02 | Discharge: 2011-08-02 | Disposition: A | Discharge status: Home / Self Care | Payer: PRIVATE HEALTH INSURANCE | Source: Ambulatory Visit | Attending: Obstetrics and Gynecology | Admitting: Obstetrics and Gynecology

## 2011-08-02 DIAGNOSIS — R928 Other abnormal and inconclusive findings on diagnostic imaging of breast: Secondary | ICD-10-CM

## 2011-10-04 ENCOUNTER — Encounter (HOSPITAL_BASED_OUTPATIENT_CLINIC_OR_DEPARTMENT_OTHER): Payer: Self-pay | Admitting: *Deleted

## 2011-10-10 ENCOUNTER — Encounter (HOSPITAL_BASED_OUTPATIENT_CLINIC_OR_DEPARTMENT_OTHER): Payer: Self-pay | Admitting: Certified Registered Nurse Anesthetist

## 2011-10-10 ENCOUNTER — Ambulatory Visit (HOSPITAL_BASED_OUTPATIENT_CLINIC_OR_DEPARTMENT_OTHER): Payer: PRIVATE HEALTH INSURANCE | Admitting: Certified Registered Nurse Anesthetist

## 2011-10-10 ENCOUNTER — Encounter (HOSPITAL_BASED_OUTPATIENT_CLINIC_OR_DEPARTMENT_OTHER): Payer: Self-pay | Admitting: *Deleted

## 2011-10-10 ENCOUNTER — Encounter (HOSPITAL_BASED_OUTPATIENT_CLINIC_OR_DEPARTMENT_OTHER)
Admission: RE | Disposition: A | Discharge status: Home / Self Care | Payer: Self-pay | Source: Ambulatory Visit | Attending: Plastic Surgery

## 2011-10-10 ENCOUNTER — Ambulatory Visit (HOSPITAL_BASED_OUTPATIENT_CLINIC_OR_DEPARTMENT_OTHER)
Admission: RE | Admit: 2011-10-10 | Discharge: 2011-10-10 | Disposition: A | Discharge status: Home / Self Care | Payer: PRIVATE HEALTH INSURANCE | Source: Ambulatory Visit | Attending: Plastic Surgery | Admitting: Plastic Surgery

## 2011-10-10 DIAGNOSIS — N62 Hypertrophy of breast: Secondary | ICD-10-CM | POA: Insufficient documentation

## 2011-10-10 HISTORY — DX: Major depressive disorder, single episode, unspecified: F32.9

## 2011-10-10 HISTORY — DX: Interstitial cystitis (chronic) without hematuria: N30.10

## 2011-10-10 HISTORY — DX: Nausea with vomiting, unspecified: R11.2

## 2011-10-10 HISTORY — DX: Anxiety disorder, unspecified: F41.9

## 2011-10-10 HISTORY — DX: Headache: R51

## 2011-10-10 HISTORY — DX: Depression, unspecified: F32.A

## 2011-10-10 HISTORY — DX: Restless legs syndrome: G25.81

## 2011-10-10 HISTORY — DX: Other specified postprocedural states: Z98.890

## 2011-10-10 HISTORY — PX: BREAST REDUCTION SURGERY: SHX8

## 2011-10-10 HISTORY — DX: Irritable bowel syndrome, unspecified: K58.9

## 2011-10-10 LAB — POCT HEMOGLOBIN-HEMACUE: Hemoglobin: 13.9 g/dL (ref 12.0–15.0)

## 2011-10-10 SURGERY — MAMMOPLASTY, REDUCTION
Anesthesia: General | Site: Breast | Laterality: Bilateral | Wound class: Clean

## 2011-10-10 MED ORDER — FENTANYL CITRATE 0.05 MG/ML IJ SOLN
INTRAMUSCULAR | Status: DC | PRN
Start: 2011-10-10 — End: 2011-10-10
  Administered 2011-10-10 (×5): 50 ug via INTRAVENOUS
  Administered 2011-10-10: 100 ug via INTRAVENOUS

## 2011-10-10 MED ORDER — MIDAZOLAM HCL 2 MG/2ML IJ SOLN: 0.5000 mg | Freq: Once | INTRAMUSCULAR | Status: DC | PRN

## 2011-10-10 MED ORDER — BUPIVACAINE-EPINEPHRINE 0.5% -1:200000 IJ SOLN
INTRAMUSCULAR | Status: DC | PRN
  Administered 2011-10-10 (×2): 20 mL

## 2011-10-10 MED ORDER — DEXAMETHASONE SODIUM PHOSPHATE 4 MG/ML IJ SOLN
INTRAMUSCULAR | Status: DC | PRN
  Administered 2011-10-10: 10 mg via INTRAVENOUS

## 2011-10-10 MED ORDER — MEPERIDINE HCL 25 MG/ML IJ SOLN: 6.2500 mg | INTRAMUSCULAR | Status: DC | PRN

## 2011-10-10 MED ORDER — SUCCINYLCHOLINE CHLORIDE 20 MG/ML IJ SOLN
INTRAMUSCULAR | Status: DC | PRN
  Administered 2011-10-10: 100 mg via INTRAVENOUS

## 2011-10-10 MED ORDER — LIDOCAINE-EPINEPHRINE 1 %-1:100000 IJ SOLN
INTRAMUSCULAR | Status: DC | PRN
  Administered 2011-10-10: 40 mL

## 2011-10-10 MED ORDER — LACTATED RINGERS IV SOLN
INTRAVENOUS | Status: DC
  Administered 2011-10-10 (×3): via INTRAVENOUS

## 2011-10-10 MED ORDER — LIDOCAINE HCL (CARDIAC) 20 MG/ML IV SOLN
INTRAVENOUS | Status: DC | PRN
  Administered 2011-10-10: 30 mg via INTRAVENOUS

## 2011-10-10 MED ORDER — HYDROMORPHONE HCL PF 1 MG/ML IJ SOLN
0.2500 mg | INTRAMUSCULAR | Status: DC | PRN
  Administered 2011-10-10: 0.25 mg via INTRAVENOUS
  Administered 2011-10-10 (×2): 0.5 mg via INTRAVENOUS

## 2011-10-10 MED ORDER — CEFAZOLIN SODIUM 1-5 GM-% IV SOLN
INTRAVENOUS | Status: DC | PRN
  Administered 2011-10-10: 2 g via INTRAVENOUS

## 2011-10-10 MED ORDER — SCOPOLAMINE 1 MG/3DAYS TD PT72
1.0000 | MEDICATED_PATCH | TRANSDERMAL | Status: DC
  Administered 2011-10-10: 1.5 mg via TRANSDERMAL

## 2011-10-10 MED ORDER — MIDAZOLAM HCL 5 MG/5ML IJ SOLN
INTRAMUSCULAR | Status: DC | PRN
  Administered 2011-10-10: 2 mg via INTRAVENOUS

## 2011-10-10 MED ORDER — PROPOFOL 10 MG/ML IV EMUL
INTRAVENOUS | Status: DC | PRN
  Administered 2011-10-10: 200 mg via INTRAVENOUS

## 2011-10-10 MED ORDER — ONDANSETRON HCL 4 MG/2ML IJ SOLN
INTRAMUSCULAR | Status: DC | PRN
  Administered 2011-10-10: 4 mg via INTRAVENOUS

## 2011-10-10 MED ORDER — PROMETHAZINE HCL 25 MG/ML IJ SOLN: 6.2500 mg | INTRAMUSCULAR | Status: DC | PRN

## 2011-10-10 SURGICAL SUPPLY — 65 items
APL SKNCLS STERI-STRIP NONHPOA (GAUZE/BANDAGES/DRESSINGS) ×2
BANDAGE GAUZE ELAST BULKY 4 IN (GAUZE/BANDAGES/DRESSINGS) ×4 IMPLANT
BENZOIN TINCTURE PRP APPL 2/3 (GAUZE/BANDAGES/DRESSINGS) ×4 IMPLANT
BLADE KNIFE PERSONA 10 (BLADE) ×8 IMPLANT
BLADE KNIFE PERSONA 15 (BLADE) ×6 IMPLANT
BNDG COHESIVE 3X5 TAN STRL LF (GAUZE/BANDAGES/DRESSINGS) ×1 IMPLANT
CANISTER SUCTION 1200CC (MISCELLANEOUS) ×2 IMPLANT
CAP BOUFFANT 24 BLUE NURSES (PROTECTIVE WEAR) ×2 IMPLANT
CLOTH BEACON ORANGE TIMEOUT ST (SAFETY) ×2 IMPLANT
COVER MAYO STAND STRL (DRAPES) ×2 IMPLANT
COVER TABLE BACK 60X90 (DRAPES) ×2 IMPLANT
DECANTER SPIKE VIAL GLASS SM (MISCELLANEOUS) ×2 IMPLANT
DRAIN CHANNEL 10F 3/8 F FF (DRAIN) ×5 IMPLANT
DRAPE LAPAROSCOPIC ABDOMINAL (DRAPES) ×2 IMPLANT
DRSG EMULSION OIL 3X3 NADH (GAUZE/BANDAGES/DRESSINGS) ×4 IMPLANT
DRSG PAD ABDOMINAL 8X10 ST (GAUZE/BANDAGES/DRESSINGS) ×4 IMPLANT
ELECT NDL TIP 2.8 STRL (NEEDLE) IMPLANT
ELECT NEEDLE TIP 2.8 STRL (NEEDLE) IMPLANT
ELECT REM PT RETURN 9FT ADLT (ELECTROSURGICAL) ×2
ELECTRODE REM PT RTRN 9FT ADLT (ELECTROSURGICAL) ×1 IMPLANT
EVACUATOR SILICONE 100CC (DRAIN) ×4 IMPLANT
FILTER 7/8 IN (FILTER) ×2 IMPLANT
GLOVE BIO SURGEON STRL SZ 6.5 (GLOVE) ×2 IMPLANT
GLOVE BIO SURGEON STRL SZ7 (GLOVE) ×1 IMPLANT
GLOVE BIOGEL M STRL SZ7.5 (GLOVE) ×3 IMPLANT
GLOVE BIOGEL PI IND STRL 6.5 (GLOVE) IMPLANT
GLOVE BIOGEL PI INDICATOR 6.5 (GLOVE)
GLOVE ECLIPSE 6.5 STRL STRAW (GLOVE) ×3 IMPLANT
GLOVE ECLIPSE 7.0 STRL STRAW (GLOVE) ×1 IMPLANT
GOWN PREVENTION PLUS XLARGE (GOWN DISPOSABLE) IMPLANT
GOWN PREVENTION PLUS XXLARGE (GOWN DISPOSABLE) IMPLANT
NDL HYPO 25X1 1.5 SAFETY (NEEDLE) ×1 IMPLANT
NDL SPNL 18GX3.5 QUINCKE PK (NEEDLE) ×1 IMPLANT
NEEDLE HYPO 25X1 1.5 SAFETY (NEEDLE) ×2 IMPLANT
NEEDLE SPNL 18GX3.5 QUINCKE PK (NEEDLE) ×2 IMPLANT
NS IRRIG 1000ML POUR BTL (IV SOLUTION) ×4 IMPLANT
PACK BASIN DAY SURGERY FS (CUSTOM PROCEDURE TRAY) ×2 IMPLANT
PIN SAFETY STERILE (MISCELLANEOUS) ×2 IMPLANT
SCRUB PCMX 4 OZ (MISCELLANEOUS) ×2 IMPLANT
SCRUB TECHNI CARE SURGICAL (MISCELLANEOUS) IMPLANT
SLEEVE SCD COMPRESS KNEE MED (MISCELLANEOUS) ×2 IMPLANT
SPECIMEN JAR MEDIUM (MISCELLANEOUS) ×5 IMPLANT
SPECIMEN JAR X LARGE (MISCELLANEOUS) IMPLANT
SPONGE GAUZE 4X4 12PLY (GAUZE/BANDAGES/DRESSINGS) ×4 IMPLANT
SPONGE LAP 18X18 X RAY DECT (DISPOSABLE) ×6 IMPLANT
STAPLER VISISTAT 35W (STAPLE) ×4 IMPLANT
STRIP CLOSURE SKIN 1/2X4 (GAUZE/BANDAGES/DRESSINGS) ×8 IMPLANT
SUT ETHILON 3 0 PS 1 (SUTURE) ×3 IMPLANT
SUT MNCRL AB 3-0 PS2 18 (SUTURE) ×8 IMPLANT
SUT MNCRL AB 4-0 PS2 18 (SUTURE) ×4 IMPLANT
SUT MON AB 5-0 PS2 18 (SUTURE) ×4 IMPLANT
SUT PROLENE 2 0 CT2 30 (SUTURE) ×2 IMPLANT
SUT PROLENE 3 0 PS 1 (SUTURE) ×5 IMPLANT
SUT QUILL PDO 2-0 (SUTURE) ×4 IMPLANT
SYR BULB IRRIGATION 50ML (SYRINGE) ×4 IMPLANT
SYR CONTROL 10ML LL (SYRINGE) ×6 IMPLANT
TOWEL OR 17X24 6PK STRL BLUE (TOWEL DISPOSABLE) ×6 IMPLANT
TOWEL OR NON WOVEN STRL DISP B (DISPOSABLE) ×2 IMPLANT
TRAY DSU PREP LF (CUSTOM PROCEDURE TRAY) ×2 IMPLANT
TRAY FOLEY CATH 14FR (SET/KITS/TRAYS/PACK) ×2 IMPLANT
TUBE CONNECTING 20X1/4 (TUBING) ×2 IMPLANT
UNDERPAD 30X30 INCONTINENT (UNDERPADS AND DIAPERS) ×4 IMPLANT
VAC PENCILS W/TUBING CLEAR (MISCELLANEOUS) ×2 IMPLANT
WATER STERILE IRR 1000ML POUR (IV SOLUTION) ×2 IMPLANT
YANKAUER SUCT BULB TIP NO VENT (SUCTIONS) ×2 IMPLANT

## 2011-10-10 NOTE — Transfer of Care (Signed)
Immediate Anesthesia Transfer of Care Note  Patient: Marisa Hamilton  Procedure(s) Performed: Procedure(s) (LRB): MAMMARY REDUCTION  (BREAST) (Bilateral)  Patient Location: PACU  Anesthesia Type: General  Level of Consciousness: awake, alert , oriented and patient cooperative  Airway & Oxygen Therapy: Patient Spontanous Breathing and Patient connected to face mask oxygen  Post-op Assessment: Report given to PACU RN and Post -op Vital signs reviewed and stable  Post vital signs: Reviewed and stable  Complications: No apparent anesthesia complications

## 2011-10-10 NOTE — Discharge Instructions (Signed)
1. No lifting greater than 5 lbs with arms for 4 weeks. 2. Empty, strip, record and reactivate JP drains 3 times a day. 3. Percocet 5/325 mg tabs 1-2 tabs po q 4-6 hours prn pain- prescription given in office. 4. Duricef 1 tab po bid- prescription given in office. 5. Sterapred dose pack as directed- prescription given in office. 6. Follow-up appointment Friday in office.    Post Anesthesia Home Care Instructions  Activity: Get plenty of rest for the remainder of the day. A responsible adult should stay with you for 24 hours following the procedure.  For the next 24 hours, DO NOT: -Drive a car -Paediatric nurse -Drink alcoholic beverages -Take any medication unless instructed by your physician -Make any legal decisions or sign important papers.  Meals: Start with liquid foods such as gelatin or soup. Progress to regular foods as tolerated. Avoid greasy, spicy, heavy foods. If nausea and/or vomiting occur, drink only clear liquids until the nausea and/or vomiting subsides. Call your physician if vomiting continues.  Special Instructions/Symptoms: Your throat may feel dry or sore from the anesthesia or the breathing tube placed in your throat during surgery. If this causes discomfort, gargle with warm salt water. The discomfort should disappear within 24 hours.  About my Jackson-Pratt Bulb Drain  What is a Jackson-Pratt bulb? A Jackson-Pratt is a soft, round device used to collect drainage. It is connected to a long, thin drainage catheter, which is held in place by one or two small stiches near your surgical incision site. When the bulb is squeezed, it forms a vacuum, forcing the drainage to empty into the bulb.  Emptying the Jackson-Pratt bulb- To empty the bulb: 1. Release the plug on the top of the bulb. 2. Pour the bulb's contents into a measuring container which your nurse will provide. 3. Record the time emptied and amount of drainage. Empty the drain(s) as often as your      doctor or nurse recommends.  Date                  Time                    Amount (Drain 1)                 Amount (Drain 2)  _____________________________________________________________________  _____________________________________________________________________  _____________________________________________________________________  _____________________________________________________________________  _____________________________________________________________________  _____________________________________________________________________  _____________________________________________________________________  _____________________________________________________________________  Squeezing the Jackson-Pratt Bulb- To squeeze the bulb: 1. Make sure the plug at the top of the bulb is open. 2. Squeeze the bulb tightly in your fist. You will hear air squeezing from the bulb. 3. Replace the plug while the bulb is squeezed. 4. Use a safety pin to attach the bulb to your clothing. This will keep the catheter from     pulling at the bulb insertion site.  When to call your doctor- Call your doctor if:  Drain site becomes red, swollen or hot.  You have a fever greater than 101 degrees F.  There is oozing at the drain site.  Drain falls out (apply a guaze bandage over the drain hole and secure it with tape).  Drainage increases daily not related to activity patterns. (You will usually have more drainage when you are active than when you are resting.)  Drainage has a bad odor.

## 2011-10-10 NOTE — Brief Op Note (Signed)
10/10/2011  1:25 PM  PATIENT:  SHAQUANTA HARKLESS  51 y.o. female  PRE-OPERATIVE DIAGNOSIS:  bilateral macromastia  POST-OPERATIVE DIAGNOSIS:  bilateral macromastia  PROCEDURE:  Procedure(s) (LRB): MAMMARY REDUCTION  (BREAST) (Bilateral)  SURGEON:  Surgeon(s) and Role:    * Malayasia Mirkin A Raneisha Bress, MD - Primary   ANESTHESIA:   general  EBL:  Total I/O In: 2800 [I.V.:2800] Out: 900 [Urine:700; Blood:200]  BLOOD ADMINISTERED:none  DRAINS: (60F) Jackson-Pratt drain(s) with closed bulb suction in the Breast   LOCAL MEDICATIONS USED:  MARCAINE     SPECIMEN:  Source of Specimen:  Breast  DISPOSITION OF SPECIMEN:  PATHOLOGY  COUNTS:  YES  PLAN OF CARE: Discharge to home after PACU  PATIENT DISPOSITION:  PACU - hemodynamically stable.   Delay start of Pharmacological VTE agent (>24hrs) due to surgical blood loss or risk of bleeding: not applicable

## 2011-10-10 NOTE — Anesthesia Procedure Notes (Signed)
Procedure Name: Intubation Date/Time: 10/10/2011 8:36 AM Performed by: Hermine Feria D Pre-anesthesia Checklist: Patient identified, Emergency Drugs available, Suction available and Patient being monitored Patient Re-evaluated:Patient Re-evaluated prior to inductionOxygen Delivery Method: Circle System Utilized Preoxygenation: Pre-oxygenation with 100% oxygen Intubation Type: IV induction Ventilation: Mask ventilation without difficulty Laryngoscope Size: Mac and 3 Grade View: Grade I Tube type: Oral Tube size: 7.0 mm Number of attempts: 1 Airway Equipment and Method: stylet and oral airway Placement Confirmation: ETT inserted through vocal cords under direct vision,  positive ETCO2 and breath sounds checked- equal and bilateral Secured at: 21 cm Tube secured with: Tape Dental Injury: Teeth and Oropharynx as per pre-operative assessment  Comments: LTA utilized

## 2011-10-10 NOTE — Anesthesia Preprocedure Evaluation (Signed)
Anesthesia Evaluation  Patient identified by MRN, date of birth, ID band Patient awake    Reviewed: Allergy & Precautions, H&P , NPO status , Patient's Chart, lab work & pertinent test results  History of Anesthesia Complications (+) PONV  Airway Mallampati: I TM Distance: >3 FB Neck ROM: Full    Dental No notable dental hx. (+) Teeth Intact and Dental Advisory Given   Pulmonary neg pulmonary ROS,  breath sounds clear to auscultation  Pulmonary exam normal       Cardiovascular negative cardio ROS  Rhythm:Regular Rate:Normal     Neuro/Psych negative neurological ROS     GI/Hepatic negative GI ROS, Neg liver ROS,   Endo/Other  negative endocrine ROS  Renal/GU negative Renal ROS     Musculoskeletal negative musculoskeletal ROS (+)   Abdominal   Peds  Hematology negative hematology ROS (+)   Anesthesia Other Findings   Reproductive/Obstetrics negative OB ROS                           Anesthesia Physical Anesthesia Plan  ASA: I  Anesthesia Plan: General   Post-op Pain Management:    Induction: Intravenous  Airway Management Planned: Oral ETT  Additional Equipment:   Intra-op Plan:   Post-operative Plan: Extubation in OR  Informed Consent: I have reviewed the patients History and Physical, chart, labs and discussed the procedure including the risks, benefits and alternatives for the proposed anesthesia with the patient or authorized representative who has indicated his/her understanding and acceptance.   Dental advisory given  Plan Discussed with: Surgeon and CRNA  Anesthesia Plan Comments: (Plan routine monitors, GETA)        Anesthesia Quick Evaluation

## 2011-10-10 NOTE — H&P (Addendum)
  Date of Initial H&P: 09/25/2011  History reviewed, patient examined, no change in status, stable for surgery.

## 2011-10-12 ENCOUNTER — Encounter (HOSPITAL_BASED_OUTPATIENT_CLINIC_OR_DEPARTMENT_OTHER): Payer: Self-pay | Admitting: Plastic Surgery

## 2011-10-12 NOTE — Anesthesia Postprocedure Evaluation (Signed)
  Anesthesia Post-op Note  Patient: Marisa Hamilton  Procedure(s) Performed: Procedure(s) (LRB): MAMMARY REDUCTION  (BREAST) (Bilateral)  Patient discharged prior to post-anesth visit.  No apparent or reported anesthetic complications on post op phone call.

## 2011-11-23 NOTE — Op Note (Signed)
NAME:  Marisa Hamilton, Marisa Hamilton NO.:  [id number]  MEDICAL RECORD NO.:  [id number]  LOCATION:                                 FACILITY:  PHYSICIAN:  Brantley Persons, M.D.DATE OF BIRTH:  12-30-1960  DATE OF PROCEDURE:  10/10/2011 DATE OF DISCHARGE:                              OPERATIVE REPORT   PREOPERATIVE DIAGNOSIS:  Bilateral macromastia.  POSTOPERATIVE DIAGNOSIS:  Bilateral macromastia.  PROCEDURE:  Bilateral reduction mammoplasties.  ATTENDING SURGEON:  Brantley Persons, MD  ANESTHESIA:  General.  COMPLICATIONS:  None.  INDICATIONS FOR PROCEDURE:  The patient is a 51 year old Caucasian female who has bilateral macromastia that is clinically symptomatic. She presents to undergo bilateral reduction mammoplasties.  DESCRIPTION OF PROCEDURE:  The patient was marked in the preop holding area in the pattern of Wise for the future bilateral reduction mammoplasties.  She was then taken back into the OR and placed on the table in supine position.  After adequate general anesthesia was obtained, the patient's chest was prepped with Techni-Care and draped in sterile fashion.  The bases of the breasts had been injected 1% lidocaine with epinephrine.  After adequate hemostasis and anesthesia had taken effect, the procedure was begun.  Both of the breast reductions were performed in the following similar manner.  The nipple-areolar complex was marked with 45 mm nipple marker. The skin was then incised and deepithelialized around the nipple-areolar complex down to the inframammary crease in inferior pedicle pattern. Next, the medial, superior, and lateral skin flaps were elevated down to the chest wall.  The excess fat and glandular tissue removed from the inferior pedicle.  The nipple-areolar complex was examined and found to be pink and viable.  The wound was irrigated with saline irrigation. Meticulous hemostasis was obtained with the Bovie electrocautery.   The inferior pedicle was centralized using 3-0 Prolene suture.  A #10 JP flat fully fluted drain was placed into the wound.  The skin flaps brought together at the inverted T junction with a 2-0 Prolene suture. The incisions were then stapled for temporary closure.  The breasts compared and found to have good shape and symmetry.  The incisions were then closed from the medial aspect of the JP drain to the medial aspect of the [facility] incision by first placing a few 3-0 Monocryl sutures to loosely tack together the dermal layer, and then both the dermal and cuticular layers were closed in a single suture using a 2-0 Quill PDO barbed suture.  Lateral to the JP drain, incision was closed using 3-0 Monocryl in the dermal layer, followed by 3-0 Monocryl running intracuticular stitch on the skin.  The vertical limb of the Wise pattern was closed in the dermal layer with 3-0 Monocryl suture.  The patient was then placed in the upright position.  The future location of the nipple-areolar complexes was marked on both breast mounds using the 45 mm nipple marker.  She was then placed back in the recumbent position.  Both of the nipple-areolar complexes were brought out onto the breast mounds in the following similar manner.  The skin was incised as marked and removed full thickness into the subcutaneous tissues.  The  nipple- areolar complex was examined, found to be pink and viable, then brought out through this aperture and sewn in place using 4-0 Monocryl in the dermal layer, followed by a 5-0 Monocryl running intracuticular stitch on the skin.  This 5-0 Monocryl suture was then brought down to close the cuticular layer of the vertical limb incision as well.  The pectoralis major muscle had been infiltrated with 0.5% Marcaine with epinephrine prior to closure of the wound and now the skin and soft tissue at the base of the breast was then also infiltrated with 0.5% Marcaine with epinephrine to  provide a postoperative anesthesia.  The incisions were dressed with benzoin, Steri-Strips, and the nipples additionally bacitracin ointment and Adaptic.  The JP drain was sewn in place using 3-0 nylon suture, 4 x 4's were placed over the incisions and the patient was placed into light postoperative support bra.  There were no complications.  The patient tolerated procedure well.  The final needle, sponge counts reported to be correct at the end of the case.  The patient was then extubated and taken to recovery room in stable condition.  She was also recovered without complications.  Both the patient and her husband were given proper postoperative wound care instructions including care of the JP drains.  She was then discharged home in the care of her husband in stable condition.  Followup appointment will be in a few days in the office.          ______________________________ Brantley Persons, M.D.     MC/MEDQ  D:  11/22/2011  T:  11/22/2011  Job:  [id number]

## 2012-02-13 ENCOUNTER — Other Ambulatory Visit: Payer: Self-pay | Admitting: Gynecology

## 2012-02-13 DIAGNOSIS — N6320 Unspecified lump in the left breast, unspecified quadrant: Secondary | ICD-10-CM

## 2012-02-16 ENCOUNTER — Other Ambulatory Visit: Payer: Self-pay | Admitting: Gynecology

## 2012-02-16 ENCOUNTER — Ambulatory Visit
Admission: RE | Admit: 2012-02-16 | Discharge: 2012-02-16 | Disposition: A | Discharge status: Home / Self Care | Payer: BC Managed Care – PPO | Source: Ambulatory Visit | Attending: Gynecology | Admitting: Gynecology

## 2012-02-16 DIAGNOSIS — N6320 Unspecified lump in the left breast, unspecified quadrant: Secondary | ICD-10-CM

## 2012-02-23 ENCOUNTER — Other Ambulatory Visit: Payer: Self-pay | Admitting: Gynecology

## 2012-02-23 DIAGNOSIS — N6320 Unspecified lump in the left breast, unspecified quadrant: Secondary | ICD-10-CM

## 2012-02-26 ENCOUNTER — Ambulatory Visit
Admission: RE | Admit: 2012-02-26 | Discharge: 2012-02-26 | Disposition: A | Discharge status: Home / Self Care | Payer: BC Managed Care – PPO | Source: Ambulatory Visit | Attending: Gynecology | Admitting: Gynecology

## 2012-02-26 DIAGNOSIS — N6320 Unspecified lump in the left breast, unspecified quadrant: Secondary | ICD-10-CM

## 2012-02-26 HISTORY — PX: BREAST BIOPSY: SHX20

## 2012-04-22 ENCOUNTER — Encounter: Payer: Self-pay | Admitting: Pulmonary Disease

## 2012-04-22 ENCOUNTER — Ambulatory Visit (INDEPENDENT_AMBULATORY_CARE_PROVIDER_SITE_OTHER): Payer: BC Managed Care – PPO | Admitting: Pulmonary Disease

## 2012-04-22 VITALS — BP 102/72 | HR 76 | Temp 97.8°F | Ht 65.5 in | Wt 167.2 lb

## 2012-04-22 DIAGNOSIS — G43709 Chronic migraine without aura, not intractable, without status migrainosus: Secondary | ICD-10-CM | POA: Insufficient documentation

## 2012-04-22 DIAGNOSIS — G4700 Insomnia, unspecified: Secondary | ICD-10-CM

## 2012-04-22 DIAGNOSIS — F518 Other sleep disorders not due to a substance or known physiological condition: Secondary | ICD-10-CM

## 2012-04-22 DIAGNOSIS — G2589 Other specified extrapyramidal and movement disorders: Secondary | ICD-10-CM

## 2012-04-22 NOTE — Progress Notes (Signed)
  Subjective:    Patient ID: Marisa Hamilton, female    DOB: 1961/02/02, 51 y.o.   MRN: [id number]  HPI The patient comes in today for followup of her multiple sleep issues.  She is taking Mirapex for restless leg syndrome, and feels that she is doing very well on this medication.  She also has significant insomnia issues, and is working on good sleep hygiene as well as behavioral therapies.  She is on trazodone for her sleep onset issues and feels that it has helped quite a bit.   Review of Systems  Constitutional: Negative for fever and unexpected weight change.  HENT: Positive for congestion, sore throat, rhinorrhea, sneezing, postnasal drip and sinus pressure. Negative for ear pain, nosebleeds, trouble swallowing and dental problem.        Two sinus infection since September  Eyes: Negative for redness and itching.  Respiratory: Negative for cough, chest tightness, shortness of breath and wheezing.   Cardiovascular: Negative for palpitations and leg swelling.  Gastrointestinal: Negative for nausea and vomiting.  Genitourinary: Negative for dysuria.  Musculoskeletal: Negative for joint swelling.  Skin: Negative for rash.  Neurological: Positive for headaches ( treated with medication).  Hematological: Does not bruise/bleed easily.  Psychiatric/Behavioral: Negative for dysphoric mood. The patient is not nervous/anxious.        Objective:   Physical Exam Well-developed female in no acute distress Nose with purulent discharge noted Neck without lymphadenopathy or thyromegaly Lower extremities without edema, no cyanosis Alert, does not appear to be sleepy, moves all 4 extremities.       Assessment & Plan:

## 2012-04-22 NOTE — Assessment & Plan Note (Signed)
The patient is doing well currently on Mirapex, and does not feel that she has breakthrough symptoms.  I've asked her to continue on this, and also to work on exercise during the day which is another treatment for RLS.

## 2012-04-22 NOTE — Patient Instructions (Addendum)
Continue on mirapex for your RLS Stick to good sleep hygiene and habits as we have discussed. followup with me in one year if doing well.

## 2012-04-22 NOTE — Assessment & Plan Note (Signed)
Patient is much improved since working on behavioral therapies and good sleep hygiene, and I have asked her to try and decrease her trazodone dose if she is able over time.  She is also following up with a behavioral specialist.

## 2012-05-17 ENCOUNTER — Telehealth: Payer: Self-pay | Admitting: Pulmonary Disease

## 2012-05-17 MED ORDER — PRAMIPEXOLE DIHYDROCHLORIDE 0.125 MG PO TABS: ORAL_TABLET | ORAL | Status: DC

## 2012-05-17 NOTE — Telephone Encounter (Signed)
Pt is aware that her prescription has been sent in. I apologized for any inconvenience.

## 2012-07-22 ENCOUNTER — Other Ambulatory Visit: Payer: Self-pay | Admitting: Obstetrics and Gynecology

## 2012-07-22 ENCOUNTER — Other Ambulatory Visit: Payer: Self-pay

## 2012-07-22 DIAGNOSIS — Z1231 Encounter for screening mammogram for malignant neoplasm of breast: Secondary | ICD-10-CM

## 2012-07-29 ENCOUNTER — Ambulatory Visit (HOSPITAL_COMMUNITY)
Admission: RE | Admit: 2012-07-29 | Discharge: 2012-07-29 | Disposition: A | Discharge status: Home / Self Care | Payer: BC Managed Care – PPO | Source: Ambulatory Visit | Attending: Obstetrics and Gynecology | Admitting: Obstetrics and Gynecology

## 2012-07-29 ENCOUNTER — Other Ambulatory Visit: Payer: Self-pay | Admitting: Certified Nurse Midwife

## 2012-07-29 DIAGNOSIS — Z1231 Encounter for screening mammogram for malignant neoplasm of breast: Secondary | ICD-10-CM | POA: Insufficient documentation

## 2012-07-31 LAB — HEMOGLOBIN, FINGERSTICK: Hemoglobin, fingerstick: 13.9 g/dL (ref 12.0–16.0)

## 2012-08-05 ENCOUNTER — Other Ambulatory Visit: Payer: Self-pay | Admitting: Obstetrics and Gynecology

## 2012-08-05 DIAGNOSIS — N950 Postmenopausal bleeding: Secondary | ICD-10-CM

## 2012-08-06 ENCOUNTER — Ambulatory Visit (INDEPENDENT_AMBULATORY_CARE_PROVIDER_SITE_OTHER): Payer: BC Managed Care – PPO | Admitting: Obstetrics and Gynecology

## 2012-08-06 ENCOUNTER — Other Ambulatory Visit: Payer: Self-pay | Admitting: Obstetrics and Gynecology

## 2012-08-06 ENCOUNTER — Ambulatory Visit (INDEPENDENT_AMBULATORY_CARE_PROVIDER_SITE_OTHER): Payer: BC Managed Care – PPO

## 2012-08-06 DIAGNOSIS — N950 Postmenopausal bleeding: Secondary | ICD-10-CM

## 2012-08-06 DIAGNOSIS — N8000 Endometriosis of the uterus, unspecified: Secondary | ICD-10-CM

## 2012-08-06 DIAGNOSIS — N83339 Acquired atrophy of ovary and fallopian tube, unspecified side: Secondary | ICD-10-CM

## 2012-08-06 DIAGNOSIS — N8003 Adenomyosis of the uterus: Secondary | ICD-10-CM

## 2012-08-06 NOTE — Progress Notes (Signed)
52 y.o.Married female here for a pelvic ultrasound with sonohystogram.  Indication: post menopausal bleeding  No LMP recorded. Patient is postmenopausal.  Contraception:  None needed  Technique:  Both transabdominal and transvaginal ultrasound  examinations of the pelvis were performed. Transabdominal technique  was performed for global imaging of the pelvis including uterus,  ovaries, adnexal regions, and pelvic cul-de-sac.  It was necessary to proceed with endovaginal exam following the abdominal ultrasound transabdominal exam to visualize the endometrium and adnexa.  Color and duplex Doppler ultrasound was utilized to evaluate blood  flow to the ovaries.    FINDINGS:  UTERUS:  10.0 X 4.8 X 6.3 CM ANTEVERTED UTERUS HETEROGENEOUS WITH CORTICAL CYSTIC AREAS AND AP THICKNESS OF MYOMETRIUM C/W ADENOMYOSIS.      EMS: ENDOMETRIUM =4.49 MM  ADNEXA: RT OVARY =  1.7 X .8 X 1.3 CM ATROPHIC NORMAL ECHO PATTERN.                  LT OVARY = 1.8 X .9 X 1.1 CM  ATROPHIC NORMAL ECHO PATTERN.  CUL DE SAC: NEGATIVE.  SHSG:  After obtaining appropriate verbal consent from patient, the cervix was visualized using a speculum, and prepped with betadine.  A tenaculum  was applied to the cervix.  Dilation of the cervix was not necessary. The catheter was passed into the uterus and sterile saline introduced, with the following findings: NO DEFECT SEEN WITHIN THE ENDOMETRIUM CAVITY.  Endometrial biopsy done next.  Speculum re-introduced, cervix visualized.  Pipelle to 8 cm, 2 passes, small amount of tissue obtained, sent to path.  Pt tolerated procedure well.    A:  Minimal post meno bleeding, possibly d/t adenomyosis.  Path on bx pending.    P:  As long as biopsy is benign, pt can continue on her HRT.  We will call her in about a week with the results.

## 2012-08-06 NOTE — Patient Instructions (Signed)

## 2012-08-07 NOTE — Progress Notes (Signed)
Pt. Was notified of results and to continue HRT.

## 2012-08-13 HISTORY — PX: ABDOMINOPLASTY: SUR9

## 2012-08-28 ENCOUNTER — Telehealth: Payer: Self-pay | Admitting: *Deleted

## 2012-08-28 NOTE — Telephone Encounter (Signed)
Mammogram 07/29/2012 out of hold.

## 2012-08-28 NOTE — Telephone Encounter (Signed)
May we take her chart out of hold mammogram. See EPIC for report on 07/29/2012. Chart in your office. sue

## 2012-08-28 NOTE — Telephone Encounter (Signed)
Yes

## 2012-09-02 ENCOUNTER — Encounter: Payer: Self-pay | Admitting: *Deleted

## 2012-11-25 ENCOUNTER — Telehealth: Payer: Self-pay | Admitting: Pulmonary Disease

## 2012-11-25 MED ORDER — GABAPENTIN ENACARBIL ER 600 MG PO TBCR: 1.0000 | EXTENDED_RELEASE_TABLET | Freq: Every day | ORAL | Status: DC

## 2012-11-25 NOTE — Telephone Encounter (Signed)
I spoke with pt. She c/o hand/feet swelling going on x 2 months. Per pt she thinks this is related to the mirapex. She had same reaction to requip in the past. She stated at the end of the day her hands hurts d/t the swelling. Per pt her feet looks like "little sausages". She has not seen anyone for the swelling. Per pt she does not feel like she needs to be seen just her medication changed. Please advise KC thanks  No Known Allergies

## 2012-11-25 NOTE — Telephone Encounter (Signed)
I spoke with the Marisa Hamilton and she wants to stop the mirapex and try another medication. She wants rx [facility] to [facility] on [address]. Please advise on what medication. Carron Curie, CMA

## 2012-11-25 NOTE — Telephone Encounter (Signed)
Spoke with pt and notified of recs per [facility] She verbalized understanding Rx was sent to pharm and pt to call with feed back before 2 wks is up

## 2012-11-25 NOTE — Telephone Encounter (Signed)
Let her know that she has been on mirapex for a long time, and this has started much later.  I do not know whether this is a problem or not, but mirapex and requip are the very best meds for her RLS.  If she cannot truly take either of these, then we move to 3rd and 4th line treatments which are not as proven.    2 options: 1) stop mirapex which may or may not be the problem, and start on another class of medication that may not work as well for your RLS 2) speak with your primary md, and see if there is another reason for your swelling before stopping your mirapex   Let me know what she decides.

## 2012-11-25 NOTE — Telephone Encounter (Signed)
Call in horizant 600mg , one at 5pm each day.  #14, no fills.  Let us know if helps and can send in prescription.

## 2012-11-25 NOTE — Telephone Encounter (Signed)
LMTCB

## 2013-03-12 ENCOUNTER — Telehealth: Payer: Self-pay | Admitting: Certified Nurse Midwife

## 2013-03-12 ENCOUNTER — Encounter: Payer: Self-pay | Admitting: Certified Nurse Midwife

## 2013-03-12 NOTE — Telephone Encounter (Signed)
Patient previously saw Dr. Tresa Res.  Patient have Migraine with aura and seeing neurology. Feels that hormones are contributing to migraines.  Patient on Vivelle and progesterone.   Would like to to dc hormones r/t migraines.  Offered OV and patient agreeable. Scheduled for tomorrow.  Routing to provider for final review. Patient agreeable to disposition. Will close encounter

## 2013-03-12 NOTE — Telephone Encounter (Signed)
Chief Complaint  Patient presents with   side affects from meds  pt is having some side affects of her hormone medication. Please call.

## 2013-03-13 ENCOUNTER — Encounter: Payer: Self-pay | Admitting: Certified Nurse Midwife

## 2013-03-13 ENCOUNTER — Ambulatory Visit (INDEPENDENT_AMBULATORY_CARE_PROVIDER_SITE_OTHER): Payer: BC Managed Care – PPO | Admitting: Certified Nurse Midwife

## 2013-03-13 VITALS — BP 120/60 | HR 64 | Resp 14 | Ht 65.5 in | Wt 168.0 lb

## 2013-03-13 DIAGNOSIS — N950 Postmenopausal bleeding: Secondary | ICD-10-CM

## 2013-03-13 DIAGNOSIS — G43109 Migraine with aura, not intractable, without status migrainosus: Secondary | ICD-10-CM

## 2013-03-13 NOTE — Progress Notes (Signed)
Reviewed prior evaluation from 08/06/12.  As pt planning to stop HRT, do not feel additional w/u needed at this time.  Recommended OV 3 months for recheck.  D/W Leota Sauers.

## 2013-03-13 NOTE — Progress Notes (Signed)
52 y.o.MarriedCaucasian female complaining of 1-2 weeks out of month of of vaginal bleeding. She describes it as like a period but slightly lighter and no cramping when occurs. Does not occur after sexual activity.  She  Is currently on continuous HRT .  She has had previous episodes of menopausal bleeding. Last episode with evaluation was 07-29-12. Patient had PUS, SHGM and endometrial biopsy(benign). Per evaluation note suspect adenomyosis(Dr. Romine). She has been menopausal for  2 years. Associated symptoms are decreased libido (mild), insomnia (moderate) and migraine headaches (with aura). Patient also here to discuss stopping HRT due to now migraines with aura which has increased in length and intensity. Sees neurology for, who also recommended this.      reports that she has never smoked. She does not have any smokeless tobacco history on file. She reports that she does not drink alcohol or use illicit drugs.  Patient Active Problem List   Diagnosis Date Noted  . 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 PERSISTENT DISORDER INITIATING/MAINTAINING SLEEP 10/06/2009  . INADEQUATE SLEEP HYGIENE 10/06/2009  . DEPRESSION 10/05/2009  . VARICOSE VEINS, LOWER EXTREMITIES 10/05/2009  . ALLERGIC RHINITIS 10/05/2009  . 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 MIGRAINES, HX OF 10/05/2009    Past Medical History  Diagnosis Date  . Headache(784.0)   . Anxiety   . Depression   . PONV (postoperative nausea and vomiting)   . Cystitis, interstitial     HAD BLADDER SX FOR SAME  . IBS (irritable bowel syndrome)     TAKES PROBIOTICS  . Restless leg syndrome   . Migraine     Past Surgical History  Procedure Laterality Date  . Vascular surgery      VARICOSE VEINS  . Nasal septum surgery    . Lasik    . Bladder surgery      FOR IC  . Breast reduction surgery  10/10/2011    Procedure: MAMMARY REDUCTION  (BREAST);  Surgeon: Karie Fetch, MD;  Location: [facility];  Service:  Plastics;  Laterality: Bilateral;  . Breast biopsy Left 02-26-12    negative  . Abdominoplasty  4/14    Current Outpatient Prescriptions  Medication Sig Dispense Refill  . clonazePAM (KLONOPIN) 0.5 MG tablet Take 1 tablet by mouth At bedtime as needed.      . EMSAM 6 MG/24HR Place 0.5 patches onto the skin daily.       . fexofenadine (ALLEGRA) 180 MG tablet Take 180 mg by mouth daily.      [name] MAGNESIUM PO Take 250 mg by mouth 2 (two) times daily.      . Multiple Vitamins-Minerals (CENTRUM SILVER PO) Take 1 tablet by mouth daily.      . pramipexole (MIRAPEX) 0.125 MG tablet take 2 tablets by mouth at bedtime  180 tablet  3  . Probiotic Product (PROBIOTIC PO) Take by mouth daily.      . progesterone (PROMETRIUM) 200 MG capsule Take 1 tablet by mouth daily.      [name] Specialty Vitamins Products (SUPPORT-500 PO) Take by mouth 2 (two) times daily.      . traZODone (DESYREL) 50 MG tablet Take 2 tablets by mouth at bedtime.      [name] VIVELLE-DOT 0.1 MG/24HR Place 1 tablet onto the skin 2 (two) times a week.       No current facility-administered medications for this visit.    WGN:FAOZHYQMV items are noted in HPI.  Exam:    BP 120/60  Pulse 64  Resp 14  Ht 5' 5.5" (1.664 m)  Wt 168 lb (76.204 kg)  BMI 27.52 kg/m2  LMP 03/10/2013  General appearance: alert, cooperative and appears stated age Abdomen: normal findings: with healed abdominalplasty scar no inguinal nodes palpated  Pelvic: External genitalia:  no lesions and well estrogenized              Bartholins and Skenes: normal, Bartholin's, Urethra, Skene's normal                 Vagina: normal appearance with red blood present small amount              Cervix: normal appearance and non tender, blood noted from os                      Bimanual Exam:  Uterus:  uterus is 8 week size, shape, consistency and nontender                                      Adnexa:   normal adnexa in size, nontender and no masses                                       Rectovaginal: Deferred                                      Anus:  defer exam  A:  Post menopausal bleeding 2-HRT use with Vivelle dot and Prometrium 3-History of now migraine with aura, which has increased, patient desires to stop HRT.  P:DIscussed with patient cyclic bleeding can occur with HRT,but this is not cyclic bleeding and needs evaluation. Reminded that previous evaluation negative. Patient agreeable to whatever Dr. Hyacinth Meeker recommends( she requested Dr. Hyacinth Meeker)  Will advise and schedule PUS, endo bx if needed. 2-Discussed weaning off HRT, but patient's headaches with aura have increased and discussed increase of stroke with aura, prefer she just stop, and if she does she may have hot flashes and night sweats occur. Patient desires the headache to decrease! Plan to stop HRT now. Patient will advise if problems. 3-Continue follow up with Neurology.  Rv as above, prn

## 2013-03-18 ENCOUNTER — Telehealth: Payer: Self-pay | Admitting: Certified Nurse Midwife

## 2013-03-18 NOTE — Telephone Encounter (Signed)
Phone to call regarding need to have follow up for questionable PMB after consulting

## 2013-03-18 NOTE — Telephone Encounter (Signed)
Call to patient regarding follow up needed with questionable PMB at last visit. Patient now has stopped HRT. Discussed with patient consulted with Dr. Hyacinth Meeker and she feels no other follow up or EMBx needed at this time. Discussed bleeding should stop when off HRT. Aware of need to evaluate if bleeding excessive or continues. Patient voiced understanding. Re check in 3 months.  Patient forwarded to appt. Desk to schedule. Questions addressed.

## 2013-04-22 ENCOUNTER — Encounter: Payer: Self-pay | Admitting: Pulmonary Disease

## 2013-04-22 ENCOUNTER — Ambulatory Visit (INDEPENDENT_AMBULATORY_CARE_PROVIDER_SITE_OTHER): Payer: BC Managed Care – PPO | Admitting: Pulmonary Disease

## 2013-04-22 VITALS — BP 104/66 | HR 70 | Temp 97.7°F | Ht 65.0 in | Wt 170.0 lb

## 2013-04-22 DIAGNOSIS — G2589 Other specified extrapyramidal and movement disorders: Secondary | ICD-10-CM

## 2013-04-22 DIAGNOSIS — G4700 Insomnia, unspecified: Secondary | ICD-10-CM

## 2013-04-22 NOTE — Patient Instructions (Signed)
Continue with trazodone for sleep, and can use horizant for restless legs if your symptoms escalate. Continue good sleep hygiene and behavioral therapies as we have discussed.   followup with me as needed.

## 2013-04-22 NOTE — Assessment & Plan Note (Signed)
The patient currently is on no maintenance treatment for restless leg syndrome, and she only has symptoms about 3 or 4 times a month the most, however, she is not having any leg jerks during sleep according to her husband. She has horizant available if needed.

## 2013-04-22 NOTE — Assessment & Plan Note (Signed)
The patient is much improved from a sleep standpoint with good sleep hygiene and behavioral therapies. She usually gets 5-6 hours of sleep a night, and only occasionally has an episode of prolonged awakening during the middle of the night. She is continuing with behavioral therapies if she cannot initiate sleep after awakening.

## 2013-04-22 NOTE — Progress Notes (Signed)
   Subjective:    Patient ID: Marisa Hamilton, female    DOB: March 20, 1961, 52 y.o.   MRN: [id number]  HPI Patient comes in today for followup of her restless leg syndrome and psychophysiologic insomnia. She has had issues with dopamine agonist, and was prescribed horizant. She never filled this because she felt that her RLS symptoms are significantly improved. She has been following good sleep hygiene as well as behavioral therapies, and is sleeping roughly 5-6 hours a night. She does occasionally have awakenings during the night with a prolonged period before she returns to sleep, but these are not common. Overall, she feels that she is doing much better.   Review of Systems  Constitutional: Negative for fever and unexpected weight change.  HENT: Negative for congestion, dental problem, ear pain, nosebleeds, postnasal drip, rhinorrhea, sinus pressure, sneezing, sore throat and trouble swallowing.   Eyes: Negative for redness and itching.  Respiratory: Negative for cough, chest tightness, shortness of breath and wheezing.   Cardiovascular: Negative for palpitations and leg swelling.  Gastrointestinal: Negative for nausea and vomiting.  Genitourinary: Negative for dysuria.  Musculoskeletal: Negative for joint swelling.  Skin: Negative for rash.  Neurological: Positive for headaches.  Hematological: Does not bruise/bleed easily.  Psychiatric/Behavioral: Negative for dysphoric mood. The patient is not nervous/anxious.        Objective:   Physical Exam Overweight female in no acute distress Nose without purulence or discharge noted Neck without lymphadenopathy or thyromegaly Lower extremities with minimal edema, no cyanosis Alert, does not appear to be sleepy, moves all 4 extremities.       Assessment & Plan:

## 2013-06-17 ENCOUNTER — Telehealth: Payer: Self-pay | Admitting: Certified Nurse Midwife

## 2013-06-17 NOTE — Telephone Encounter (Signed)
LMTCB

## 2013-06-17 NOTE — Telephone Encounter (Signed)
Patient had to cancel her 3 mth reck appt for 06/18/13 due to a funeral she wanted to know if it is okay to wait until she has her aex it is scheduled for 07/30/13. Said she is doing fine not problems.

## 2013-06-18 ENCOUNTER — Ambulatory Visit: Payer: BC Managed Care – PPO | Admitting: Certified Nurse Midwife

## 2013-06-25 NOTE — Telephone Encounter (Signed)
Spoke with patient. She is feeling well. No vaginal bleeding. Mother is ill and having trouble with scheduling appointments. Patient will come for scheduled exam on 07/30/13.  Routing to provider for final review. Patient agreeable to disposition. Will close encounter

## 2013-07-30 ENCOUNTER — Encounter: Payer: Self-pay | Admitting: Certified Nurse Midwife

## 2013-07-30 ENCOUNTER — Ambulatory Visit (INDEPENDENT_AMBULATORY_CARE_PROVIDER_SITE_OTHER): Payer: BC Managed Care – PPO | Admitting: Certified Nurse Midwife

## 2013-07-30 VITALS — BP 111/76 | HR 79 | Resp 16 | Ht 65.25 in | Wt 175.0 lb

## 2013-07-30 DIAGNOSIS — Z0000 Encounter for general adult medical examination without abnormal findings: Secondary | ICD-10-CM

## 2013-07-30 DIAGNOSIS — Z01419 Encounter for gynecological examination (general) (routine) without abnormal findings: Secondary | ICD-10-CM

## 2013-07-30 DIAGNOSIS — R319 Hematuria, unspecified: Secondary | ICD-10-CM

## 2013-07-30 LAB — POCT URINALYSIS DIPSTICK
Bilirubin, UA: NEGATIVE
Glucose, UA: NEGATIVE
Ketones, UA: NEGATIVE
Leukocytes, UA: NEGATIVE
Nitrite, UA: NEGATIVE
Protein, UA: NEGATIVE
Urobilinogen, UA: NEGATIVE
pH, UA: 5

## 2013-07-30 LAB — HEMOGLOBIN, FINGERSTICK: Hemoglobin, fingerstick: 13.8 g/dL (ref 12.0–16.0)

## 2013-07-30 NOTE — Progress Notes (Signed)
53 y.o. [id number] Married Caucasian Fe here for annual exam. Menopausal no HRT now. Patient has had no more brown discharge since cessation. Patient's headaches have increased slightly in past few months. Saw neurologist yesterday who is considering medication change. Patient sees counselor/MD for anxiety and depression, medications working without change. Patient concerned about weight gain of 7 pounds since last aex and edema of legs that she has noticed in the past few months.   Patient's last menstrual period was 07/29/2012.          Sexually active: yes  The current method of family planning is vasectomy.    Exercising: yes  walking Smoker:  no  Health Maintenance: Pap:  07-29-12 neg MMG:  07-29-12 normal Colonoscopy: 2013 BMD:   none TDaP:  2009 Labs: Poct urine-rbc 1+, Hgb-13.8 Self breast exam: done monthly   reports that she has never smoked. She does not have any smokeless tobacco history on file. She reports that she drinks about 0.5 ounces of alcohol per week. She reports that she does not use illicit drugs.  Past Medical History  Diagnosis Date  . Headache(784.0)   . Anxiety   . Depression   . PONV (postoperative nausea and vomiting)   . Cystitis, interstitial     HAD BLADDER SX FOR SAME  . IBS (irritable bowel syndrome)     TAKES PROBIOTICS  . Restless leg syndrome   . Migraine   . Leg pain     Past Surgical History  Procedure Laterality Date  . Vascular surgery      VARICOSE VEINS  . Nasal septum surgery    . Lasik    . Bladder surgery      FOR IC  . Breast reduction surgery  10/10/2011    Procedure: MAMMARY REDUCTION  (BREAST);  Surgeon: Charlene Brooke, MD;  Location: [facility];  Service: Plastics;  Laterality: Bilateral;  . Breast biopsy Left 02-26-12    negative  . Abdominoplasty  4/14    Current Outpatient Prescriptions  Medication Sig Dispense Refill  . chlorproMAZINE (THORAZINE) 25 MG tablet Take 1 tablet by mouth as needed.       . clonazePAM (KLONOPIN) 1 MG tablet Take 1 mg by mouth as needed for anxiety.      . lamoTRIgine (LAMICTAL) 25 MG tablet Takes 2      . lidocaine (LMX) 4 % cream Apply 1 application topically as needed.      . loratadine (CLARITIN) 10 MG tablet Take 10 mg by mouth daily.      [name] MAGNESIUM PO Take 250 mg by mouth daily.       [name] MELATONIN PO Take 250 mg by mouth at bedtime.      . Multiple Vitamins-Minerals (CENTRUM SILVER PO) Take 1 tablet by mouth daily.      . OnabotulinumtoxinA (BOTOX IJ) Inject as directed every 3 (three) months.      . Probiotic Product (PROBIOTIC PO) Take by mouth daily.      . risperiDONE (RISPERDAL) 0.5 MG tablet as needed.      . rizatriptan (MAXALT) 10 MG tablet as needed.      . SUMAtriptan Succinate 6 MG/0.5ML SOCT Inject into the skin as needed.      [name] tiZANidine (ZANAFLEX) 4 MG tablet Take 1 tablet by mouth as needed.      . traZODone (DESYREL) 100 MG tablet Take 100 mg by mouth at bedtime.      . Vortioxetine HBr ([place])  5 MG TABS Take by mouth daily.       No current facility-administered medications for this visit.    Family History  Problem Relation Age of Onset  . Osteoporosis Mother   . Atrial fibrillation Mother   . Diabetes Father   . Hypertension Father   . Breast cancer Maternal Grandmother   . Cancer Maternal Grandmother     ovarian  . Diabetes Paternal Grandmother     ROS:  Pertinent items are noted in HPI.  Otherwise, a comprehensive ROS was negative.  Exam:   BP 111/76  Pulse 79  Resp 16  Ht 5' 5.25" (1.657 m)  Wt 175 lb (79.379 kg)  BMI 28.91 kg/m2  LMP 07/29/2012 Height: 5' 5.25" (165.7 cm)  Ht Readings from Last 3 Encounters:  07/30/13 5' 5.25" (1.657 m)  04/22/13 5\' 5"  (1.651 m)  03/13/13 5' 5.5" (1.664 m)    General appearance: alert, cooperative and appears stated age Head: Normocephalic, without obvious abnormality, atraumatic Neck: no adenopathy, supple, symmetrical, trachea midline and thyroid normal to  inspection and palpation Lungs: clear to auscultation bilaterally Breasts: normal appearance, no masses or tenderness, No nipple retraction or dimpling, No nipple discharge or bleeding, No axillary or supraclavicular adenopathy Heart: regular rate and rhythm Abdomen: soft, non-tender; no masses,  no organomegaly Extremities: extremities normal, atraumatic, no cyanosis or edema Skin: Skin color, texture, turgor normal. No rashes or lesions Lymph nodes: Cervical, supraclavicular, and axillary nodes normal. No abnormal inguinal nodes palpated Neurologic: Grossly normal   Pelvic: External genitalia:  no lesions              Urethra:  normal appearing urethra with no masses, tenderness or lesions              Bartholin's and Skene's: normal                 Vagina: normal appearing vagina with normal color and discharge, no lesions              Cervix: normal,non tender              Pap taken: no Bimanual Exam:  Uterus:  normal size, contour, position, consistency, mobility, non-tender and anteverted              Adnexa: normal adnexa and no mass, fullness, tenderness               Rectovaginal: Confirms               Anus:  normal sphincter tone, no lesions  A:  Well Woman with normal exam  Menopausal no HRT  R/O UTI 1+ blood asymptomatic, history of microscopic hematuria  Anxiety and depression on stable medication with MD and with counseling  Migraine headaches change under evaluation with Neurology  Social stress with care giving of mother and mother in law.  P:   Reviewed health and wellness pertinent to exam  Aware of need for evaluation if vaginal bleeding  Aware of UTI symptoms and will advise  Lab: Urine micro/culture  Continue follow up as indicated  Encouraged to seek other family support and friends.  Acknowledge that she can not do everything needed and ask for help as needed. Patient plans to try. Spouse supportive.  Fasting Labs patient will schedule: TSH,LIpid Panel,  CMP, Vitamin D,Hgb. A1-c,CBC  Pap smear as per guidelines   Mammogram yearly pap smear not taken today  counseled on breast self exam, mammography screening, adequate intake  of calcium and vitamin D, diet and exercise  return annually or prn  An After Visit Summary was printed and given to the patient.

## 2013-07-30 NOTE — Patient Instructions (Signed)

## 2013-07-31 ENCOUNTER — Other Ambulatory Visit: Payer: Self-pay | Admitting: Certified Nurse Midwife

## 2013-07-31 DIAGNOSIS — Z1231 Encounter for screening mammogram for malignant neoplasm of breast: Secondary | ICD-10-CM

## 2013-07-31 LAB — URINALYSIS, MICROSCOPIC ONLY
Bacteria, UA: NONE SEEN
Casts: NONE SEEN
Crystals: NONE SEEN
Squamous Epithelial / LPF: NONE SEEN

## 2013-07-31 LAB — URINE CULTURE
Colony Count: NO GROWTH
Organism ID, Bacteria: NO GROWTH

## 2013-07-31 LAB — VITAMIN D 25 HYDROXY (VIT D DEFICIENCY, FRACTURES): Vit D, 25-Hydroxy: 41 ng/mL (ref 30–89)

## 2013-08-01 NOTE — Progress Notes (Signed)
Reviewed personally.  M. Suzanne Lagena Strand, MD.  

## 2013-08-04 ENCOUNTER — Other Ambulatory Visit (INDEPENDENT_AMBULATORY_CARE_PROVIDER_SITE_OTHER): Payer: BC Managed Care – PPO

## 2013-08-04 DIAGNOSIS — Z0000 Encounter for general adult medical examination without abnormal findings: Secondary | ICD-10-CM

## 2013-08-05 ENCOUNTER — Telehealth: Payer: Self-pay

## 2013-08-05 ENCOUNTER — Other Ambulatory Visit: Payer: Self-pay | Admitting: Certified Nurse Midwife

## 2013-08-05 DIAGNOSIS — R6889 Other general symptoms and signs: Secondary | ICD-10-CM

## 2013-08-05 LAB — COMPREHENSIVE METABOLIC PANEL
ALT: 16 U/L (ref 0–35)
AST: 16 U/L (ref 0–37)
Albumin: 4 g/dL (ref 3.5–5.2)
Alkaline Phosphatase: 56 U/L (ref 39–117)
BUN: 12 mg/dL (ref 6–23)
CO2: 29 mEq/L (ref 19–32)
Calcium: 9.1 mg/dL (ref 8.4–10.5)
Chloride: 101 mEq/L (ref 96–112)
Creat: 0.87 mg/dL (ref 0.50–1.10)
Glucose, Bld: 92 mg/dL (ref 70–99)
Potassium: 4.3 mEq/L (ref 3.5–5.3)
Sodium: 140 mEq/L (ref 135–145)
Total Bilirubin: 0.4 mg/dL (ref 0.2–1.2)
Total Protein: 6.4 g/dL (ref 6.0–8.3)

## 2013-08-05 LAB — TSH: TSH: 1.874 u[IU]/mL (ref 0.350–4.500)

## 2013-08-05 LAB — CBC
HCT: 40.4 % (ref 36.0–46.0)
Hemoglobin: 13.5 g/dL (ref 12.0–15.0)
MCH: 29.6 pg (ref 26.0–34.0)
MCHC: 33.4 g/dL (ref 30.0–36.0)
MCV: 88.6 fL (ref 78.0–100.0)
Platelets: 237 10*3/uL (ref 150–400)
RBC: 4.56 MIL/uL (ref 3.87–5.11)
RDW: 14.8 % (ref 11.5–15.5)
WBC: 5.9 10*3/uL (ref 4.0–10.5)

## 2013-08-05 LAB — HEMOGLOBIN A1C
Hgb A1c MFr Bld: 5.6 % (ref <5.7)
Mean Plasma Glucose: 114 mg/dL (ref <117)

## 2013-08-05 LAB — LIPID PANEL
Cholesterol: 172 mg/dL (ref 0–200)
HDL: 64 mg/dL (ref >39)
LDL Cholesterol: 74 mg/dL (ref 0–99)
Total CHOL/HDL Ratio: 2.7 Ratio
Triglycerides: 170 mg/dL — ABNORMAL HIGH (ref <150)
VLDL: 34 mg/dL (ref 0–40)

## 2013-08-05 NOTE — Telephone Encounter (Signed)
lmtcb

## 2013-08-05 NOTE — Telephone Encounter (Signed)
Message copied by Susy Manor on Tue Aug 05, 2013  1:42 PM ------      Message from: Regina Eck      Created: Tue Aug 05, 2013  1:07 PM       Notify patient that TSH, CBC, Hgb A1-c, liver, kidney and glucose profile normal      Lipid panel cholesterol normal, HDL, LDL,VLDL normal      Triglycerides slightly high, needs to work decreasing fried foods and concentrated carbohydrates as well as exercise. Re check in 4 months, needs to be fasting.  Order in ------

## 2013-08-07 ENCOUNTER — Ambulatory Visit (HOSPITAL_COMMUNITY)
Admission: RE | Admit: 2013-08-07 | Discharge: 2013-08-07 | Disposition: A | Discharge status: Home / Self Care | Payer: BC Managed Care – PPO | Source: Ambulatory Visit | Attending: Certified Nurse Midwife | Admitting: Certified Nurse Midwife

## 2013-08-07 DIAGNOSIS — Z1231 Encounter for screening mammogram for malignant neoplasm of breast: Secondary | ICD-10-CM | POA: Insufficient documentation

## 2013-08-07 NOTE — Telephone Encounter (Signed)
Patient is calling Joy back

## 2013-08-07 NOTE — Telephone Encounter (Signed)
Patient notified of results as written by provider 

## 2013-10-23 ENCOUNTER — Other Ambulatory Visit: Payer: Self-pay | Admitting: *Deleted

## 2013-10-23 DIAGNOSIS — I83893 Varicose veins of bilateral lower extremities with other complications: Secondary | ICD-10-CM

## 2013-10-27 ENCOUNTER — Encounter: Payer: Self-pay | Admitting: Vascular Surgery

## 2013-10-28 ENCOUNTER — Ambulatory Visit (HOSPITAL_COMMUNITY)
Admission: RE | Admit: 2013-10-28 | Discharge: 2013-10-28 | Disposition: A | Discharge status: Home / Self Care | Payer: BC Managed Care – PPO | Source: Ambulatory Visit | Attending: Vascular Surgery | Admitting: Vascular Surgery

## 2013-10-28 ENCOUNTER — Ambulatory Visit (INDEPENDENT_AMBULATORY_CARE_PROVIDER_SITE_OTHER): Payer: BC Managed Care – PPO | Admitting: Vascular Surgery

## 2013-10-28 ENCOUNTER — Encounter: Payer: Self-pay | Admitting: Vascular Surgery

## 2013-10-28 VITALS — BP 114/72 | HR 70 | Resp 16 | Ht 65.0 in | Wt 179.0 lb

## 2013-10-28 DIAGNOSIS — I872 Venous insufficiency (chronic) (peripheral): Secondary | ICD-10-CM | POA: Insufficient documentation

## 2013-10-28 DIAGNOSIS — I83893 Varicose veins of bilateral lower extremities with other complications: Secondary | ICD-10-CM | POA: Insufficient documentation

## 2013-10-28 NOTE — Progress Notes (Signed)
Subjective:     Patient ID: Marisa Hamilton, female   DOB: 05/21/1960, 53 y.o.   MRN: [id number]  HPI this 53 year old female was evaluated for pain and swelling in both lower extremities. States over the past 20+ years she has been having increasing discomfort as the day progresses. She develops swelling from the midcalf distally. She has had varicose veins removed by Dr. Lindwood Qua 20 years ago. She has no history of thrombophlebitis or deep vein thrombosis. She was evaluated at [facility] April 9 20 15  and was found to have gross reflux in both great saphenous veins from the mid calf to the saphenofemoral junction with a large caliber vein. She has been wearing a long-leg elastic compression stockings 30-40 mm gradient since that time. This makes her legs feel slightly better but not asymptomatic and as the day progresses she continues to have increasing symptoms. She states that over the past few years the pain in her legs has gotten so severe that she is unable to bear it. She describes this as aching throbbing and burning discomfort and if she gets off of her feet and all place the legs he takes a few hours for this to resolve. This is affecting her daily living to a great degree.  Past Medical History  Diagnosis Date  . Headache(784.0)   . Anxiety   . Depression   . PONV (postoperative nausea and vomiting)   . Cystitis, interstitial     HAD BLADDER SX FOR SAME  . IBS (irritable bowel syndrome)     TAKES PROBIOTICS  . Restless leg syndrome   . Migraine   . Leg pain     History  Substance Use Topics  . Smoking status: Never Smoker   . Smokeless tobacco: Not on file  . Alcohol Use: 0.5 oz/week    1 drink(s) per week    Family History  Problem Relation Age of Onset  . Osteoporosis Mother   . Atrial fibrillation Mother   . Diabetes Father   . Hypertension Father   . Breast cancer Maternal Grandmother   . Cancer Maternal Grandmother     ovarian  . Diabetes Paternal  Grandmother     No Known Allergies  Current outpatient prescriptions:clonazePAM (KLONOPIN) 1 MG tablet, Take 1 mg by mouth as needed for anxiety., Disp: , Rfl: ;  gabapentin (NEURONTIN) 300 MG capsule, Take 600 mg by mouth 2 (two) times daily., Disp: , Rfl: ;  lamoTRIgine (LAMICTAL) 25 MG tablet, Takes 2, Disp: , Rfl: ;  lidocaine (LMX) 4 % cream, Apply 1 application topically as needed., Disp: , Rfl:  Multiple Vitamins-Minerals (CENTRUM SILVER PO), Take 1 tablet by mouth daily., Disp: , Rfl: ;  rizatriptan (MAXALT) 10 MG tablet, as needed., Disp: , Rfl: ;  SUMAtriptan Succinate 6 MG/0.5ML SOCT, Inject into the skin as needed., Disp: , Rfl: ;  tiZANidine (ZANAFLEX) 4 MG tablet, Take 1 tablet by mouth as needed., Disp: , Rfl: ;  traZODone (DESYREL) 100 MG tablet, Take 100 mg by mouth at bedtime., Disp: , Rfl:  Vortioxetine HBr (BRINTELLIX) 5 MG TABS, Take by mouth daily., Disp: , Rfl: ;  chlorproMAZINE (THORAZINE) 25 MG tablet, Take 1 tablet by mouth as needed., Disp: , Rfl: ;  loratadine (CLARITIN) 10 MG tablet, Take 10 mg by mouth daily., Disp: , Rfl: ;  MAGNESIUM PO, Take 250 mg by mouth daily. , Disp: , Rfl: ;  MELATONIN PO, Take 250 mg by mouth at bedtime., Disp: ,  Rfl:  OnabotulinumtoxinA (BOTOX IJ), Inject as directed every 3 (three) months., Disp: , Rfl: ;  Probiotic Product (PROBIOTIC PO), Take by mouth daily., Disp: , Rfl: ;  risperiDONE (RISPERDAL) 0.5 MG tablet, as needed., Disp: , Rfl:   BP 114/72  Pulse 70  Resp 16  Ht 5\' 5"  (1.651 m)  Wt 179 lb (81.194 kg)  BMI 29.79 kg/m2  LMP 07/29/2012  Body mass index is 29.79 kg/(m^2).           Review of Systems denies chest pain, dyspnea on exertion, PND, orthopnea, hemoptysis, or claudication. Other systems negative complete review of systems    Objective:   Physical Exam BP 114/72  Pulse 70  Resp 16  Ht 5\' 5"  (1.651 m)  Wt 179 lb (81.194 kg)  BMI 29.79 kg/m2  LMP 07/29/2012  Gen.-alert and oriented x3 in no apparent  distress HEENT normal for age Lungs no rhonchi or wheezing Cardiovascular regular rhythm no murmurs carotid pulses 3+ palpable no bruits audible Abdomen soft nontender no palpable masses Musculoskeletal free of  major deformities Skin clear -no rashes Neurologic normal Lower extremities 3+ femoral and dorsalis pedis pulses palpable bilaterally with 1+ edema bilaterally Left leg with small nest of varicosities in lower third left ankle area with no hyperpigmentation or ulceration. Both legs mildly tender to palpation in the medial calf areas.  Today I ordered lower extremity venous duplex exam bilaterally which are compared to previous study performed at [facility] in April of 2015. She does have gross reflux in both great saphenous systems with large caliber vein measuring up to 1.1 cm on the left and at 2.83 cm on the right. There is no reflux in the deep system.        Assessment:     Gross reflux bilateral great saphenous veins with severe pain and distal edema which worsens as the day progresses-fracture patient's daily living and not responding to conservative measures including 30-40 mm glass to compression stockings prescribed on 08/21/2013 as well as elevation and ibuprofen    Plan:     Patient will return in one month which will complete a 3 month trial of conservative management If no significant improvement she needs laser ablation left greater saphenous vein followed by laser ablation right great saphenous vein to attempt to relieve the severe symptoms which are disabling   her  Patient to return in one month

## 2013-11-24 ENCOUNTER — Encounter: Payer: Self-pay | Admitting: Vascular Surgery

## 2013-11-25 ENCOUNTER — Encounter: Payer: Self-pay | Admitting: Vascular Surgery

## 2013-11-25 ENCOUNTER — Ambulatory Visit (INDEPENDENT_AMBULATORY_CARE_PROVIDER_SITE_OTHER): Payer: BC Managed Care – PPO | Admitting: Vascular Surgery

## 2013-11-25 VITALS — BP 114/64 | HR 79 | Resp 16 | Ht 65.0 in | Wt 178.0 lb

## 2013-11-25 DIAGNOSIS — I83893 Varicose veins of bilateral lower extremities with other complications: Secondary | ICD-10-CM

## 2013-11-25 NOTE — Progress Notes (Signed)
Subjective:     Patient ID: Marisa Hamilton, female   DOB: [birth year], 53 y.o.   MRN: [id number]  HPI this 53 year old female returns for continued followup regarding her severe pain and swelling of both lower extremities do to gross reflux in bilateral greater saphenous veins. She is Trilone elastic compression stockings 20-30 mm gradient as well as elevation and ibuprofen with no success. She continues to have aching throbbing and burning discomfort in both legs which worsens as the day progresses. This is affecting her daily living and ability to work.  Past Medical History  Diagnosis Date  . Headache(784.0)   . Anxiety   . Depression   . PONV (postoperative nausea and vomiting)   . Cystitis, interstitial     HAD BLADDER SX FOR SAME  . IBS (irritable bowel syndrome)     TAKES PROBIOTICS  . Restless leg syndrome   . Migraine   . Leg pain     History  Substance Use Topics  . Smoking status: Never Smoker   . Smokeless tobacco: Not on file  . Alcohol Use: 0.5 oz/week    1 drink(s) per week    Family History  Problem Relation Age of Onset  . Osteoporosis Mother   . Atrial fibrillation Mother   . Diabetes Father   . Hypertension Father   . Breast cancer Maternal Grandmother   . Cancer Maternal Grandmother     ovarian  . Diabetes Paternal Grandmother     No Known Allergies  Current outpatient prescriptions:chlorproMAZINE (THORAZINE) 25 MG tablet, Take 1 tablet by mouth as needed., Disp: , Rfl: ;  clonazePAM (KLONOPIN) 1 MG tablet, Take 1 mg by mouth as needed for anxiety., Disp: , Rfl: ;  gabapentin (NEURONTIN) 300 MG capsule, Take 600 mg by mouth 2 (two) times daily., Disp: , Rfl: ;  lamoTRIgine (LAMICTAL) 25 MG tablet, Takes 2, Disp: , Rfl:  lidocaine (LMX) 4 % cream, Apply 1 application topically as needed., Disp: , Rfl: ;  loratadine (CLARITIN) 10 MG tablet, Take 10 mg by mouth daily., Disp: , Rfl: ;  MAGNESIUM PO, Take 250 mg by mouth daily. , Disp: , Rfl: ;  MELATONIN PO,  Take 250 mg by mouth at bedtime., Disp: , Rfl: ;  Multiple Vitamins-Minerals (CENTRUM SILVER PO), Take 1 tablet by mouth daily., Disp: , Rfl:  OnabotulinumtoxinA (BOTOX IJ), Inject as directed every 3 (three) months., Disp: , Rfl: ;  Probiotic Product (PROBIOTIC PO), Take by mouth daily., Disp: , Rfl: ;  risperiDONE (RISPERDAL) 0.5 MG tablet, as needed., Disp: , Rfl: ;  rizatriptan (MAXALT) 10 MG tablet, as needed., Disp: , Rfl: ;  SUMAtriptan Succinate 6 MG/0.5ML SOCT, Inject into the skin as needed., Disp: , Rfl:  tiZANidine (ZANAFLEX) 4 MG tablet, Take 1 tablet by mouth as needed., Disp: , Rfl: ;  traZODone (DESYREL) 100 MG tablet, Take 100 mg by mouth at bedtime., Disp: , Rfl: ;  Vortioxetine HBr (BRINTELLIX) 5 MG TABS, Take by mouth daily., Disp: , Rfl:   BP 114/64  Pulse 79  Resp 16  Ht 5\' 5"  (1.651 m)  Wt 178 lb (80.74 kg)  BMI 29.62 kg/m2  LMP 07/29/2012  Body mass index is 29.62 kg/(m^2).           Review of Systems denies chest pain, dyspnea on exertion, PND, orthopnea, hemoptysis     Objective:   Physical Exam BP 114/64  Pulse 79  Resp 16  Ht 5\' 5"  (1.651 m)  Wt  178 lb (80.74 kg)  BMI 29.62 kg/m2  LMP 07/29/2012  General well-developed well-nourished female in no apparent distress alert and oriented x3 Lungs rhonchi wheezing Both legs with 1-2+ edema from distal thigh to ankle. 3+ dorsalis pedis pulse palpable bilaterally. No bulging varicosities noted.  Today I reviewed the previous duplex scan performed at [facility] in April 2015 which revealed gross reflux throughout both great saphenous systems with large caliber veins and no reflux in the deep vein system        Assessment:     Severe pain and swelling both lower extremities not responding to conservative measures including long-leg elastic compression stockings 20-30 mm gradient, elevation, and ibuprofen.    Plan:     Patient needs #1 laser ablation left great saphenous vein to be followed  by a #2 laser ablation right great saphenous vein. We'll proceed with precertification 2 perform this in the near future to hopefully alleviate her edema and pain or certainly hope to improve it cause of the severe symptoms she is experiencing both at home and at work

## 2013-11-26 ENCOUNTER — Other Ambulatory Visit: Payer: Self-pay | Admitting: *Deleted

## 2013-11-26 DIAGNOSIS — I83893 Varicose veins of bilateral lower extremities with other complications: Secondary | ICD-10-CM

## 2013-12-08 ENCOUNTER — Other Ambulatory Visit (INDEPENDENT_AMBULATORY_CARE_PROVIDER_SITE_OTHER): Payer: BC Managed Care – PPO

## 2013-12-08 ENCOUNTER — Telehealth: Payer: Self-pay | Admitting: Certified Nurse Midwife

## 2013-12-08 DIAGNOSIS — R6889 Other general symptoms and signs: Secondary | ICD-10-CM

## 2013-12-08 LAB — TRIGLYCERIDES: Triglycerides: 152 mg/dL — ABNORMAL HIGH (ref <150)

## 2013-12-08 NOTE — Telephone Encounter (Signed)
Spoke with patient. Patient states that she has been experiencing intermittent right sided pain for 6 weeks. "It comes and goes. It will hurt for two days and then I wont feel anything for a couple of days and then it will come back." Patient states that the pain feels like a "bruise." Denies any urinary or bowel symptoms. No nausea or vomiting. Patient states that she has ulcerative colitis but is not sure if this is related. "It feels different." Patient would like to come in for evaluation tomorrow afternoon or any time on Wednesday. Patient denies current pain.Appointment scheduled for Wednesday at 11:15am with Regina Eck CNM. Agreeable to date and time.  Routing to provider for final review. Patient agreeable to disposition. Will close encounter

## 2013-12-08 NOTE — Telephone Encounter (Signed)
Patient is having some pain on her right side intermittently. Patient request to talk to a nurse.

## 2013-12-10 ENCOUNTER — Ambulatory Visit (INDEPENDENT_AMBULATORY_CARE_PROVIDER_SITE_OTHER): Payer: BC Managed Care – PPO | Admitting: Certified Nurse Midwife

## 2013-12-10 ENCOUNTER — Encounter: Payer: Self-pay | Admitting: Certified Nurse Midwife

## 2013-12-10 VITALS — BP 100/62 | HR 64 | Temp 97.8°F | Resp 16 | Ht 65.25 in | Wt 177.0 lb

## 2013-12-10 DIAGNOSIS — N949 Unspecified condition associated with female genital organs and menstrual cycle: Secondary | ICD-10-CM

## 2013-12-10 DIAGNOSIS — R319 Hematuria, unspecified: Secondary | ICD-10-CM

## 2013-12-10 DIAGNOSIS — R102 Pelvic and perineal pain: Secondary | ICD-10-CM

## 2013-12-10 DIAGNOSIS — M545 Low back pain, unspecified: Secondary | ICD-10-CM

## 2013-12-10 LAB — CBC WITH DIFFERENTIAL/PLATELET
Basophils Absolute: 0 10*3/uL (ref 0.0–0.1)
Basophils Relative: 0 % (ref 0–1)
Eosinophils Absolute: 0.2 10*3/uL (ref 0.0–0.7)
Eosinophils Relative: 2 % (ref 0–5)
HCT: 41.4 % (ref 36.0–46.0)
Hemoglobin: 14.1 g/dL (ref 12.0–15.0)
Lymphocytes Relative: 29 % (ref 12–46)
Lymphs Abs: 2.4 10*3/uL (ref 0.7–4.0)
MCH: 29.8 pg (ref 26.0–34.0)
MCHC: 34.1 g/dL (ref 30.0–36.0)
MCV: 87.5 fL (ref 78.0–100.0)
Monocytes Absolute: 0.6 10*3/uL (ref 0.1–1.0)
Monocytes Relative: 7 % (ref 3–12)
Neutro Abs: 5.1 10*3/uL (ref 1.7–7.7)
Neutrophils Relative %: 62 % (ref 43–77)
Platelets: 239 10*3/uL (ref 150–400)
RBC: 4.73 MIL/uL (ref 3.87–5.11)
RDW: 13.8 % (ref 11.5–15.5)
WBC: 8.3 10*3/uL (ref 4.0–10.5)

## 2013-12-10 LAB — POCT URINALYSIS DIPSTICK
Bilirubin, UA: NEGATIVE
Glucose, UA: NEGATIVE
Ketones, UA: NEGATIVE
Leukocytes, UA: NEGATIVE
Nitrite, UA: NEGATIVE
Protein, UA: NEGATIVE
Urobilinogen, UA: NEGATIVE
pH, UA: 5

## 2013-12-10 NOTE — Patient Instructions (Addendum)
Lumbosacral Strain Lumbosacral strain is a strain of any of the parts that make up your lumbosacral vertebrae. Your lumbosacral vertebrae are the bones that make up the lower third of your backbone. Your lumbosacral vertebrae are held together by muscles and tough, fibrous tissue (ligaments).  CAUSES  A sudden blow to your back can cause lumbosacral strain. Also, anything that causes an excessive stretch of the muscles in the low back can cause this strain. This is typically seen when people exert themselves strenuously, fall, lift heavy objects, bend, or crouch repeatedly. RISK FACTORS  Physically demanding work.  Participation in pushing or pulling sports or sports that require a sudden twist of the back (tennis, golf, baseball).  Weight lifting.  Excessive lower back curvature.  Forward-tilted pelvis.  Weak back or abdominal muscles or both.  Tight hamstrings. SIGNS AND SYMPTOMS  Lumbosacral strain may cause pain in the area of your injury or pain that moves (radiates) down your leg.  DIAGNOSIS Your health care provider can often diagnose lumbosacral strain through a physical exam. In some cases, you may need tests such as X-ray exams.  TREATMENT  Treatment for your lower back injury depends on many factors that your clinician will have to evaluate. However, most treatment will include the use of anti-inflammatory medicines. HOME CARE INSTRUCTIONS   Avoid hard physical activities (tennis, racquetball, waterskiing) if you are not in proper physical condition for it. This may aggravate or create problems.  If you have a back problem, avoid sports requiring sudden body movements. Swimming and walking are generally safer activities.  Maintain good posture.  Maintain a healthy weight.  For acute conditions, you may put ice on the injured area.  Put ice in a plastic bag.  Place a towel between your skin and the bag.  Leave the ice on for 20 minutes, 2-3 times a day.  When the  low back starts healing, stretching and strengthening exercises may be recommended. SEEK MEDICAL CARE IF:  Your back pain is getting worse.  You experience severe back pain not relieved with medicines. SEEK IMMEDIATE MEDICAL CARE IF:   You have numbness, tingling, weakness, or problems with the use of your arms or legs.  There is a change in bowel or bladder control.  You have increasing pain in any area of the body, including your belly (abdomen).  You notice shortness of breath, dizziness, or feel faint.  You feel sick to your stomach (nauseous), are throwing up (vomiting), or become sweaty.  You notice discoloration of your toes or legs, or your feet get very cold. MAKE SURE YOU:   Understand these instructions.  Will watch your condition.  Will get help right away if you are not doing well or get worse. Document Released: 02/08/2005 Document Revised: 05/06/2013 Document Reviewed: 12/18/2012 [facility] Patient Information 2015 [place], [place]. This information is not intended to replace advice given to you by your health care provider. Make sure you discuss any questions you have with your health care provider. Back Pain, Adult Back pain is very common. The pain often gets better over time. The cause of back pain is usually not dangerous. Most people can learn to manage their back pain on their own.  HOME CARE   Stay active. Start with short walks on flat ground if you can. Try to walk farther each day.  Do not sit, drive, or stand in one place for more than 30 minutes. Do not stay in bed.  Do not avoid exercise or work.  Activity can help your back heal faster.  Be careful when you bend or lift an object. Bend at your knees, keep the object close to you, and do not twist.  Sleep on a firm mattress. Lie on your side, and bend your knees. If you lie on your back, put a pillow under your knees.  Only take medicines as told by your doctor.  Put ice on the injured area.  Put  ice in a plastic bag.  Place a towel between your skin and the bag.  Leave the ice on for 15-20 minutes, 03-04 times a day for the first 2 to 3 days. After that, you can switch between ice and heat packs.  Ask your doctor about back exercises or massage.  Avoid feeling anxious or stressed. Find good ways to deal with stress, such as exercise. GET HELP RIGHT AWAY IF:   Your pain does not go away with rest or medicine.  Your pain does not go away in 1 week.  You have new problems.  You do not feel well.  The pain spreads into your legs.  You cannot control when you poop (bowel movement) or pee (urinate).  Your arms or legs feel weak or lose feeling (numbness).  You feel sick to your stomach (nauseous) or throw up (vomit).  You have belly (abdominal) pain.  You feel like you may pass out (faint). MAKE SURE YOU:   Understand these instructions.  Will watch your condition.  Will get help right away if you are not doing well or get worse. Document Released: 10/18/2007 Document Revised: 07/24/2011 Document Reviewed: 09/02/2013 [facility] Patient Information 2015 [place], [place]. This information is not intended to replace advice given to you by your health care provider. Make sure you discuss any questions you have with your health care provider.

## 2013-12-10 NOTE — Progress Notes (Signed)
53 y.o. Married white female  [phone] here for complaint of low back pain.  Pain started 6 weeks ago.  Pain is primarily located low back and is described as  off/ on for past 6 weeks and daily for the past week.  Pain is aggravated by movement, standing and is associated with discomfort.Patient while reclining has no pain..  She has tried the following treatments none. 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 Patient has not tried heat or OTC medication. Denies abdominal pain, urinary frequency and urgency or pain. History of microscopic hematuria with negative urology work up. 2+ RBC in urine today only. Denies constipation or diarrhea. Patient has history of colitis and has noticed slight blood when wiping only after bowel movement. She has not notified [name]. She is not currently on medication for colitis. Denies fever, chills, nausea or vomiting. Patient is convinced she has fibromyalgia and is looking to establish with new PCP as has moved to [place] now from [place]. Patient is sexually active with no pain or discomfort noted. Patient points to right sacral prominence as point of pain.  ROS: All other ROS questions are negative except as per HPI.  Exam:   BP 100/62  Pulse 64  Temp(Src) 97.8 F (36.6 C) (Oral)  Resp 16  Ht 5' 5.25" (1.657 m)  Wt 177 lb (80.287 kg)  BMI 29.24 kg/m2  LMP 07/29/2012 General appearance: alert, cooperative, appears stated age and no distress CV:  NSSR no murmur heard Lungs:clear to auscultation bilaterally Abdomen:  soft, non-tender; bowel sounds normal; no masses,  no organomegaly no point of tenderness noted, no rebound with extension or bending of legs. Lymph:  Cervical, supraclavicular, and axillary nodes normal. and inguinal nodes non tender or enlarged   Pelvic: External genitalia:  no lesions and normal              Urethra: not indicated and normal appearing urethra with no masses, tenderness or lesions              Bartholins and  Skenes: Bartholin's, Urethra, Skene's normal                 Vagina: normal appearing vagina with normal color and discharge, no lesions              Cervix: normal appearance and non tender              Pap taken: No. Bimanual Exam:  Uterus:  uterus is normal size, shape, consistency and nontender, mid position                                Adnexa:    not indicated and normal adnexa in size, nontender and no masses                               Rectovaginal: Confirms                               Anus:  Hemorrhoids noted, no thrombosed area, no blood in rectal canal with exam  Wet prep was not obtained.    A: Normal pelvic exam, feel this is not a GYN  Problem Muscular/skeltal pain ? Etiology due to prolonged standing with job vs shift in support from previous abdominalplasty in the past  year. History of colitis with rectal bleeding        P:Reviewed findings with patient after consulting with Dr Charlies Constable. Feel findings are related to back area, no indication of pelvic infection or problem. Recommendation is warm tub bath with epsom salt for pain relief alternate with ice pack to area and limit standing as much as possible. Tylenol prn pain per OTC instructions. If not improving needs to see Urgent Care( information given on) Patient agreeable Instructed to notify [name] of rectal bleeding due to her history and follow her instructions or evaluation if needed. Given information on PCP in area. Patient agreeable with plan.  Labs:  CBC with Diff. Urine micro/culture        Rv prn      An After Visit Summary was printed and given to the patient.

## 2013-12-11 LAB — URINALYSIS, MICROSCOPIC ONLY: Casts: NONE SEEN

## 2013-12-11 LAB — URINE CULTURE
Colony Count: NO GROWTH
Organism ID, Bacteria: NO GROWTH

## 2013-12-12 NOTE — Progress Notes (Signed)
Note reviewed, agree with plan.  Marget Outten, MD  

## 2014-01-02 ENCOUNTER — Encounter: Payer: Self-pay | Admitting: Vascular Surgery

## 2014-01-05 ENCOUNTER — Encounter: Payer: Self-pay | Admitting: Vascular Surgery

## 2014-01-05 ENCOUNTER — Ambulatory Visit (INDEPENDENT_AMBULATORY_CARE_PROVIDER_SITE_OTHER): Payer: BC Managed Care – PPO | Admitting: Vascular Surgery

## 2014-01-05 VITALS — BP 99/66 | HR 66 | Temp 98.4°F | Resp 16 | Ht 64.0 in | Wt 180.0 lb

## 2014-01-05 DIAGNOSIS — I83893 Varicose veins of bilateral lower extremities with other complications: Secondary | ICD-10-CM

## 2014-01-05 NOTE — Progress Notes (Signed)
   Laser Ablation Procedure      Date: 01/05/2014    AIREAL SLATER DOB:July 10, 1960  Consent signed: Yes  Surgeon:J.D. Kellie Simmering  Procedure: Laser Ablation: left Greater Saphenous Vein  BP 99/66  Pulse 66  Temp(Src) 98.4 F (36.9 C) (Temporal)  Resp 16  Ht 5\' 4"  (1.626 m)  Wt 180 lb (81.647 kg)  BMI 30.88 kg/m2  SpO2 99%  LMP 07/29/2012  Start time: 11:15   End time: 12:15  Tumescent Anesthesia: 225 cc 0.9% NaCl with 50 cc Lidocaine HCL with 1% Epi and 15 cc 8.4% NaHCO3  Local Anesthesia: 4 cc Lidocaine HCL and NaHCO3 (ratio 2:1)  Pulsed mode: 15 watts, 548ms delay, 1.0 duration Total energy: 2052, total pulses: 138, total time: 2:17    Patient tolerated procedure well: Yes  Notes:   Description of Procedure:  After marking the course of the saphenous vein and the secondary varicosities in the standing position, the patient was placed on the operating table in the supine position, and the left leg was prepped and draped in sterile fashion. Local anesthetic was administered, and under ultrasound guidance the saphenous vein was accessed with a micro needle and guide wire; then the micro puncture sheath was placed. A guide wire was inserted to the saphenofemoral junction, followed by a 5 french sheath.  The position of the sheath and then the laser fiber below the junction was confirmed using the ultrasound and visualization of the aiming beam.  Tumescent anesthesia was administered along the course of the saphenous vein using ultrasound guidance. Protective laser glasses were placed on the patient, and the laser was fired at 15 watt pulsed mode advancing 1-2 mm per sec.  For a total of 2052 joules.  A steri strip was applied to the puncture site.    ABD pads and thigh high compression stockings were applied.  Ace wrap bandages were applied over the phlebectomy sites and at the top of the saphenofemoral junction.  Blood loss was less than 15 cc.  The patient ambulated out of the  operating room having tolerated the procedure well.

## 2014-01-05 NOTE — Progress Notes (Signed)
Subjective:     Patient ID: Marisa Hamilton, female   DOB: Mar 21, 1961, 53 y.o.   MRN: [id number]  HPI this 53 year old female had laser ablation of the left great saphenous findings performed under local tumescent anesthesia. A total of 2000 J of energy was utilized. She tolerated the procedure well.   Review of Systems     Objective:   Physical Exam BP 99/66  Pulse 66  Temp(Src) 98.4 F (36.9 C) (Temporal)  Resp 16  Ht 5\' 4"  (1.626 m)  Wt 180 lb (81.647 kg)  BMI 30.88 kg/m2  SpO2 99%  LMP 07/29/2012       Assessment:     Well-tolerated laser oblation left great saphenous vein performed under local tumescent anesthesia    Plan:     Return Friday, August 28 for venous duplex exam to confirm closure left great saphenous vein. Will have similar procedure on the contralateral right leg in the near future

## 2014-01-06 ENCOUNTER — Telehealth: Payer: Self-pay | Admitting: *Deleted

## 2014-01-06 NOTE — Telephone Encounter (Signed)
Pt doing well. Unable to take Ibuprofen so doing ice and tylenol. Having some disconfort but tolerating everything well. Will see her Friday for her fu labs since she is going oot on Sunday.

## 2014-01-08 ENCOUNTER — Other Ambulatory Visit: Payer: Self-pay | Admitting: Family Medicine

## 2014-01-08 ENCOUNTER — Ambulatory Visit
Admission: RE | Admit: 2014-01-08 | Discharge: 2014-01-08 | Disposition: A | Discharge status: Home / Self Care | Payer: BC Managed Care – PPO | Source: Ambulatory Visit | Attending: Family Medicine | Admitting: Family Medicine

## 2014-01-08 ENCOUNTER — Encounter: Payer: Self-pay | Admitting: Vascular Surgery

## 2014-01-08 DIAGNOSIS — M545 Low back pain, unspecified: Secondary | ICD-10-CM

## 2014-01-09 ENCOUNTER — Ambulatory Visit (HOSPITAL_COMMUNITY)
Admission: RE | Admit: 2014-01-09 | Discharge: 2014-01-09 | Disposition: A | Discharge status: Home / Self Care | Payer: BC Managed Care – PPO | Source: Ambulatory Visit | Attending: Vascular Surgery | Admitting: Vascular Surgery

## 2014-01-09 ENCOUNTER — Ambulatory Visit (INDEPENDENT_AMBULATORY_CARE_PROVIDER_SITE_OTHER): Payer: BC Managed Care – PPO | Admitting: Vascular Surgery

## 2014-01-09 ENCOUNTER — Encounter: Payer: Self-pay | Admitting: Vascular Surgery

## 2014-01-09 VITALS — BP 109/72 | HR 81 | Resp 16 | Ht 64.5 in | Wt 180.0 lb

## 2014-01-09 DIAGNOSIS — I83893 Varicose veins of bilateral lower extremities with other complications: Secondary | ICD-10-CM | POA: Insufficient documentation

## 2014-01-09 DIAGNOSIS — Z48812 Encounter for surgical aftercare following surgery on the circulatory system: Secondary | ICD-10-CM | POA: Diagnosis present

## 2014-01-09 NOTE — Progress Notes (Signed)
Subjective:     Patient ID: Marisa Hamilton, female   DOB: Jun 07, 1960, 53 y.o.   MRN: [id number]  HPI this 53 year old female returns for initial followup regarding her laser ablation of the left great saphenous vein performed 4 days ago for venous hypertension with gross reflux causing pain and swelling. She has had a moderate amount of discomfort in the left thigh. She was unable to take ibuprofen but has taken Tylenol. She has also used an ice pack. She states that she is able to handle it well using her elastic compression stockings.  Past Medical History  Diagnosis Date  . Headache(784.0)   . Anxiety   . Depression   . PONV (postoperative nausea and vomiting)   . Cystitis, interstitial     HAD BLADDER SX FOR SAME  . IBS (irritable bowel syndrome)     TAKES PROBIOTICS  . Restless leg syndrome   . Migraine   . Leg pain     History  Substance Use Topics  . Smoking status: Never Smoker   . Smokeless tobacco: Never Used  . Alcohol Use: No    Family History  Problem Relation Age of Onset  . Osteoporosis Mother   . Atrial fibrillation Mother   . Diabetes Father   . Hypertension Father   . Breast cancer Maternal Grandmother   . Cancer Maternal Grandmother     ovarian  . Diabetes Paternal Grandmother     Allergies  Allergen Reactions  . Nsaids     Other reaction(s): Abdominal Pain Ulcerative colitis    Current outpatient prescriptions:chlorproMAZINE (THORAZINE) 25 MG tablet, Take 1 tablet by mouth as needed., Disp: , Rfl: ;  clonazePAM (KLONOPIN) 1 MG tablet, Take 1 mg by mouth as needed for anxiety., Disp: , Rfl: ;  gabapentin (NEURONTIN) 300 MG capsule, Take 600 mg by mouth at bedtime. , Disp: , Rfl: ;  Gabapentin, PHN, (GRALISE) 600 MG TABS, Take 600 mg by mouth daily after breakfast., Disp: , Rfl:  lamoTRIgine (LAMICTAL) 25 MG tablet, Takes 2, Disp: , Rfl: ;  Multiple Vitamins-Minerals (CENTRUM SILVER PO), Take 1 tablet by mouth daily., Disp: , Rfl: ;  OnabotulinumtoxinA  (BOTOX IJ), Inject as directed every 3 (three) months., Disp: , Rfl: ;  Probiotic Product (PROBIOTIC PO), Take by mouth daily., Disp: , Rfl: ;  risperiDONE (RISPERDAL) 0.5 MG tablet, as needed., Disp: , Rfl: ;  rizatriptan (MAXALT) 10 MG tablet, as needed., Disp: , Rfl:  SUMAtriptan Succinate 6 MG/0.5ML SOCT, Inject into the skin as needed., Disp: , Rfl: ;  tiZANidine (ZANAFLEX) 4 MG tablet, Take 1 tablet by mouth as needed., Disp: , Rfl: ;  traZODone (DESYREL) 100 MG tablet, Take 100 mg by mouth at bedtime., Disp: , Rfl: ;  Vortioxetine HBr (BRINTELLIX) 5 MG TABS, Take by mouth daily., Disp: , Rfl:   BP 109/72  Pulse 81  Resp 16  Ht 5' 4.5" (1.638 m)  Wt 180 lb (81.647 kg)  BMI 30.43 kg/m2  LMP 07/29/2012  Body mass index is 30.43 kg/(m^2).           Review of Systems denies chest pain, dyspnea on exertion, PND, orthopnea, hemoptysis    Objective:   Physical Exam BP 109/72  Pulse 81  Resp 16  Ht 5' 4.5" (1.638 m)  Wt 180 lb (81.647 kg)  BMI 30.43 kg/m2  LMP 07/29/2012  General well-developed well-nourished female in no apparent stress alert and oriented x3 Lungs no rhonchi or wheezing Left leg with  moderate discomfort to palpation of the great saphenous vein. 3 posterior cells pedis pulse palpable.  Today I ordered a venous duplex exam of the left leg which are reviewed and interpreted. There is no DVT. The left great saphenous vein is totally closed up to 3 cm from the saphenofemoral junction.     Assessment:     Successful laser ablation left great saphenous line for venous hypertension due to gross reflux with pain and swelling    Plan:     Plan similar procedure on the right leg 01/26/2014

## 2014-01-12 ENCOUNTER — Ambulatory Visit: Payer: BC Managed Care – PPO | Admitting: Vascular Surgery

## 2014-01-12 ENCOUNTER — Encounter (HOSPITAL_COMMUNITY): Payer: BC Managed Care – PPO

## 2014-01-16 ENCOUNTER — Ambulatory Visit: Payer: BC Managed Care – PPO | Admitting: Vascular Surgery

## 2014-01-16 ENCOUNTER — Encounter (HOSPITAL_COMMUNITY): Payer: BC Managed Care – PPO

## 2014-01-23 ENCOUNTER — Encounter: Payer: Self-pay | Admitting: Vascular Surgery

## 2014-01-26 ENCOUNTER — Ambulatory Visit (INDEPENDENT_AMBULATORY_CARE_PROVIDER_SITE_OTHER): Payer: BC Managed Care – PPO | Admitting: Vascular Surgery

## 2014-01-26 ENCOUNTER — Encounter: Payer: Self-pay | Admitting: Vascular Surgery

## 2014-01-26 VITALS — BP 103/71 | HR 89 | Resp 16 | Ht 65.0 in | Wt 180.0 lb

## 2014-01-26 DIAGNOSIS — I83893 Varicose veins of bilateral lower extremities with other complications: Secondary | ICD-10-CM

## 2014-01-26 NOTE — Progress Notes (Signed)
   Laser Ablation Procedure      Date: 01/26/2014    Marisa Hamilton DOB:01-May-1961  Consent signed: Yes  Surgeon:J.D. Kellie Simmering  Procedure: Laser Ablation: right Greater Saphenous Vein  BP 103/71  Pulse 89  Resp 16  Ht 5\' 5"  (1.651 m)  Wt 180 lb (81.647 kg)  BMI 29.95 kg/m2  LMP 07/29/2012  Start time: 10:50   End time: 11:25  Tumescent Anesthesia: 310 cc 0.9% NaCl with 50 cc Lidocaine HCL with 1% Epi and 15 cc 8.4% NaHCO3  Local Anesthesia: 5 cc Lidocaine HCL and NaHCO3 (ratio 2:1)  Pulsed mode: 15 watts, 549ms delay, 1.0 duration Total energy: 2260, total pulses: 151, total time: 2:31     Patient tolerated procedure well: Yes  Notes:   Description of Procedure:  After marking the course of the saphenous vein and the secondary varicosities in the standing position, the patient was placed on the operating table in the supine position, and the right leg was prepped and draped in sterile fashion. Local anesthetic was administered, and under ultrasound guidance the saphenous vein was accessed with a micro needle and guide wire; then the micro puncture sheath was placed. A guide wire was inserted to the saphenofemoral junction, followed by a 5 french sheath.  The position of the sheath and then the laser fiber below the junction was confirmed using the ultrasound and visualization of the aiming beam.  Tumescent anesthesia was administered along the course of the saphenous vein using ultrasound guidance. Protective laser glasses were placed on the patient, and the laser was fired at 15 watt pulsed mode advancing 1-2 mm per sec.  For a total of 2260 joules.  A steri strip was applied to the puncture site.    ABD pads and thigh high compression stockings were applied.  Ace wrap bandages were applied over the phlebectomy sites and at the top of the saphenofemoral junction.  Blood loss was less than 15 cc.  The patient ambulated out of the operating room having tolerated the procedure  well.

## 2014-01-26 NOTE — Progress Notes (Signed)
Subjective:     Patient ID: Marisa Hamilton, female   DOB: 11/25/1960, 53 y.o.   MRN: [id number]  HPI this 53 year old female laser ablation of the right great saphenous vein performed under local tumescent anesthesia. A total of 2200 J of energy was utilized. She tolerated the procedure well.   Review of Systems     Objective:   Physical Exam BP 103/71  Pulse 89  Resp 16  Ht 5\' 5"  (1.651 m)  Wt 180 lb (81.647 kg)  BMI 29.95 kg/m2  LMP 07/29/2012        Assessment:     Well-tolerated laser blush in right great saphenous vein performed under local tumescent anesthesia    Plan:     Return in one week for venous duplex exam to confirm closure right great saphenous vein

## 2014-01-27 ENCOUNTER — Telehealth: Payer: Self-pay | Admitting: *Deleted

## 2014-01-27 NOTE — Telephone Encounter (Signed)
Pt doing well having the usual discomfort. Following all instructions.

## 2014-01-28 ENCOUNTER — Encounter: Payer: Self-pay | Admitting: Vascular Surgery

## 2014-01-30 ENCOUNTER — Encounter: Payer: Self-pay | Admitting: Vascular Surgery

## 2014-02-02 ENCOUNTER — Encounter: Payer: Self-pay | Admitting: Vascular Surgery

## 2014-02-02 ENCOUNTER — Ambulatory Visit (INDEPENDENT_AMBULATORY_CARE_PROVIDER_SITE_OTHER): Payer: BC Managed Care – PPO | Admitting: Vascular Surgery

## 2014-02-02 ENCOUNTER — Ambulatory Visit (HOSPITAL_COMMUNITY)
Admission: RE | Admit: 2014-02-02 | Discharge: 2014-02-02 | Disposition: A | Discharge status: Home / Self Care | Payer: BC Managed Care – PPO | Source: Ambulatory Visit | Attending: Vascular Surgery | Admitting: Vascular Surgery

## 2014-02-02 VITALS — BP 109/69 | HR 82 | Resp 16 | Ht 65.0 in | Wt 180.0 lb

## 2014-02-02 DIAGNOSIS — I83893 Varicose veins of bilateral lower extremities with other complications: Secondary | ICD-10-CM

## 2014-02-02 NOTE — Progress Notes (Signed)
Subjective:     Patient ID: Marisa Hamilton, female   DOB: 07-20-1960, 53 y.o.   MRN: [id number]  HPI this 53 year old female returns 1 week post laser ablation right great saphenous vein performed under local tumescent anesthesia for gross reflux with pain and swelling. She has had more discomfort in the thigh following this procedure then on the contralateral left leg. She is unable to take ibuprofen so she has been taking Tylenol.She  has also tried ice pack compressions. The left leg is totally asymptomatic at this point and she states it took 2-3 weeks for her symptoms to resolve following that procedure.  Past Medical History  Diagnosis Date  . Headache(784.0)   . Anxiety   . Depression   . PONV (postoperative nausea and vomiting)   . Cystitis, interstitial     HAD BLADDER SX FOR SAME  . IBS (irritable bowel syndrome)     TAKES PROBIOTICS  . Restless leg syndrome   . Migraine   . Leg pain   . Varicose veins     History  Substance Use Topics  . Smoking status: Never Smoker   . Smokeless tobacco: Never Used  . Alcohol Use: No    Family History  Problem Relation Age of Onset  . Osteoporosis Mother   . Atrial fibrillation Mother   . Diabetes Father   . Hypertension Father   . Breast cancer Maternal Grandmother   . Cancer Maternal Grandmother     ovarian  . Diabetes Paternal Grandmother     Allergies  Allergen Reactions  . Nsaids     Other reaction(s): Abdominal Pain Ulcerative colitis    Current outpatient prescriptions:chlorproMAZINE (THORAZINE) 25 MG tablet, Take 1 tablet by mouth as needed., Disp: , Rfl: ;  clonazePAM (KLONOPIN) 1 MG tablet, Take 1 mg by mouth as needed for anxiety., Disp: , Rfl: ;  gabapentin (NEURONTIN) 300 MG capsule, Take 600 mg by mouth at bedtime. , Disp: , Rfl: ;  Gabapentin, PHN, (GRALISE) 600 MG TABS, Take 600 mg by mouth daily after breakfast., Disp: , Rfl:  lamoTRIgine (LAMICTAL) 25 MG tablet, Takes 2, Disp: , Rfl: ;  Multiple  Vitamins-Minerals (CENTRUM SILVER PO), Take 1 tablet by mouth daily., Disp: , Rfl: ;  OnabotulinumtoxinA (BOTOX IJ), Inject as directed every 3 (three) months., Disp: , Rfl: ;  Probiotic Product (PROBIOTIC PO), Take by mouth daily., Disp: , Rfl: ;  risperiDONE (RISPERDAL) 0.5 MG tablet, as needed., Disp: , Rfl: ;  rizatriptan (MAXALT) 10 MG tablet, as needed., Disp: , Rfl:  SUMAtriptan Succinate 6 MG/0.5ML SOCT, Inject into the skin as needed., Disp: , Rfl: ;  tiZANidine (ZANAFLEX) 4 MG tablet, Take 1 tablet by mouth as needed., Disp: , Rfl: ;  traZODone (DESYREL) 100 MG tablet, Take 100 mg by mouth at bedtime., Disp: , Rfl: ;  Vortioxetine HBr (BRINTELLIX) 5 MG TABS, Take by mouth daily., Disp: , Rfl:   BP 109/69  Pulse 82  Resp 16  Ht 5\' 5"  (1.651 m)  Wt 180 lb (81.647 kg)  BMI 29.95 kg/m2  LMP 07/29/2012  Body mass index is 29.95 kg/(m^2).           Review of Systems denies chest pain, dyspnea on exertion, PND, orthopnea, claudication, or hemoptysis.    Objective:   Physical Exam BP 109/69  Pulse 82  Resp 16  Ht 5\' 5"  (1.651 m)  Wt 180 lb (81.647 kg)  BMI 29.95 kg/m2  LMP 07/29/2012  Gen. well-developed well  nourished female in no apparent stress alert oriented x3 Lungs no rhonchi or wheezing Cardiovascular regular with no murmurs Right leg with mild ecchymosis over proximal great saphenous vein. Mildly to moderately tender to touch. No erythema or blistering of skin. No distal edema. 3 posterior cells pedis pulse palpable.  Today I ordered venous duplex exam of the right leg which I reviewed and interpreted. There is no DVT. Great saphenous vein is totally closed from the distal flap to near the saphenofemoral junction.     Assessment:     Successful laser blush and bilateral right saphenous veins for gross reflux with pain and swelling-continuing to have some discomfort in the right leg done 7 days ago    Plan:     Given prescription for Tylox-#30 tablets 1-2 every  4-6 hours when necessary for pain We'll continue long-leg elastic compression stockings for one week If symptoms not relieved she will be in touch with [place] otherwise we will see her on when necessary basis

## 2014-03-14 ENCOUNTER — Emergency Department (HOSPITAL_COMMUNITY)
Admission: EM | Admit: 2014-03-14 | Discharge: 2014-03-14 | Disposition: A | Discharge status: Home / Self Care | Payer: BC Managed Care – PPO | Source: Home / Self Care

## 2014-03-14 ENCOUNTER — Encounter (HOSPITAL_COMMUNITY): Payer: Self-pay | Admitting: Emergency Medicine

## 2014-03-14 DIAGNOSIS — J0141 Acute recurrent pansinusitis: Secondary | ICD-10-CM

## 2014-03-14 MED ORDER — PREDNISONE 20 MG PO TABS: ORAL_TABLET | ORAL | Status: DC

## 2014-03-14 MED ORDER — AMOXICILLIN-POT CLAVULANATE 875-125 MG PO TABS: 1.0000 | ORAL_TABLET | Freq: Two times a day (BID) | ORAL | Status: DC

## 2014-03-14 NOTE — Discharge Instructions (Signed)
Sinusitis Nasal saline frequently Flonase or nasocort nasal spray as directed  Sinusitis is redness, soreness, and inflammation of the paranasal sinuses. Paranasal sinuses are air pockets within the bones of your face (beneath the eyes, the middle of the forehead, or above the eyes). In healthy paranasal sinuses, mucus is able to drain out, and air is able to circulate through them by way of your nose. However, when your paranasal sinuses are inflamed, mucus and air can become trapped. This can allow bacteria and other germs to grow and cause infection. Sinusitis can develop quickly and last only a short time (acute) or continue over a long period (chronic). Sinusitis that lasts for more than 12 weeks is considered chronic.  CAUSES  Causes of sinusitis include:  Allergies.  Structural abnormalities, such as displacement of the cartilage that separates your nostrils (deviated septum), which can decrease the air flow through your nose and sinuses and affect sinus drainage.  Functional abnormalities, such as when the small hairs (cilia) that line your sinuses and help remove mucus do not work properly or are not present. SIGNS AND SYMPTOMS  Symptoms of acute and chronic sinusitis are the same. The primary symptoms are pain and pressure around the affected sinuses. Other symptoms include:  Upper toothache.  Earache.  Headache.  Bad breath.  Decreased sense of smell and taste.  A cough, which worsens when you are lying flat.  Fatigue.  Fever.  Thick drainage from your nose, which often is green and may contain pus (purulent).  Swelling and warmth over the affected sinuses. DIAGNOSIS  Your health care provider will perform a physical exam. During the exam, your health care provider may:  Look in your nose for signs of abnormal growths in your nostrils (nasal polyps).  Tap over the affected sinus to check for signs of infection.  View the inside of your sinuses (endoscopy) using  an imaging device that has a light attached (endoscope). If your health care provider suspects that you have chronic sinusitis, one or more of the following tests may be recommended:  Allergy tests.  Nasal culture. A sample of mucus is taken from your nose, sent to a lab, and screened for bacteria.  Nasal cytology. A sample of mucus is taken from your nose and examined by your health care provider to determine if your sinusitis is related to an allergy. TREATMENT  Most cases of acute sinusitis are related to a viral infection and will resolve on their own within 10 days. Sometimes medicines are prescribed to help relieve symptoms (pain medicine, decongestants, nasal steroid sprays, or saline sprays).  However, for sinusitis related to a bacterial infection, your health care provider will prescribe antibiotic medicines. These are medicines that will help kill the bacteria causing the infection.  Rarely, sinusitis is caused by a fungal infection. In theses cases, your health care provider will prescribe antifungal medicine. For some cases of chronic sinusitis, surgery is needed. Generally, these are cases in which sinusitis recurs more than 3 times per year, despite other treatments. HOME CARE INSTRUCTIONS   Drink plenty of water. Water helps thin the mucus so your sinuses can drain more easily.  Use a humidifier.  Inhale steam 3 to 4 times a day (for example, sit in the bathroom with the shower running).  Apply a warm, moist washcloth to your face 3 to 4 times a day, or as directed by your health care provider.  Use saline nasal sprays to help moisten and clean your sinuses.  Take medicines only as directed by your health care provider.  If you were prescribed either an antibiotic or antifungal medicine, finish it all even if you start to feel better. SEEK IMMEDIATE MEDICAL CARE IF:  You have increasing pain or severe headaches.  You have nausea, vomiting, or drowsiness.  You have  swelling around your face.  You have vision problems.  You have a stiff neck.  You have difficulty breathing. MAKE SURE YOU:   Understand these instructions.  Will watch your condition.  Will get help right away if you are not doing well or get worse. Document Released: 05/01/2005 Document Revised: 09/15/2013 Document Reviewed: 05/16/2011 [facility] Patient Information 2015 [place], [place]. This information is not intended to replace advice given to you by your health care provider. Make sure you discuss any questions you have with your health care provider.

## 2014-03-14 NOTE — ED Notes (Signed)
Pt states that she has has facial and head pain for over a week. Pt states she has hx of migraines pt states that migraine medication she is prescribed with no relief states she feels pressure right in between her eyes. Pt is in no acute distress at this time.

## 2014-03-14 NOTE — ED Provider Notes (Signed)
CSN: [id number]     Arrival date & time 03/14/14  0944 History   First MD Initiated Contact with Patient 03/14/14 1028     Chief Complaint  Patient presents with  . Facial Pain   (Consider location/radiation/quality/duration/timing/severity/associated sxs/prior Treatment) HPI Comments: 53 year old female presents with acute facial pain starting approximately 10 days ago. Pain is located over the bilateral frontal, paranasal and maxillary sinuses. She has a history of migraine headaches but states this headache is completely different and nature and character than migraines. She states that this is typical of her past sinus headaches.   Past Medical History  Diagnosis Date  . Headache(784.0)   . Anxiety   . Depression   . PONV (postoperative nausea and vomiting)   . Cystitis, interstitial     HAD BLADDER SX FOR SAME  . IBS (irritable bowel syndrome)     TAKES PROBIOTICS  . Restless leg syndrome   . Migraine   . Leg pain   . Varicose veins    Past Surgical History  Procedure Laterality Date  . Vascular surgery      VARICOSE VEINS  . Nasal septum surgery    . Lasik    . Bladder surgery      FOR IC  . Breast reduction surgery  10/10/2011    Procedure: MAMMARY REDUCTION  (BREAST);  Surgeon: Charlene Brooke, MD;  Location: [facility];  Service: Plastics;  Laterality: Bilateral;  . Breast biopsy Left 02-26-12    negative  . Abdominoplasty  4/14   Family History  Problem Relation Age of Onset  . Osteoporosis Mother   . Atrial fibrillation Mother   . Diabetes Father   . Hypertension Father   . Breast cancer Maternal Grandmother   . Cancer Maternal Grandmother     ovarian  . Diabetes Paternal Grandmother    History  Substance Use Topics  . Smoking status: Never Smoker   . Smokeless tobacco: Never Used  . Alcohol Use: No   OB History   Grav Para Term Preterm Abortions TAB SAB Ect Mult Living   2 2 2       2      Review of Systems  Constitutional:  Positive for activity change. Negative for fever and fatigue.  HENT: Positive for congestion, postnasal drip and rhinorrhea. Negative for ear pain and sore throat.   Eyes: Negative for visual disturbance.  Respiratory: Negative for cough and shortness of breath.   Cardiovascular: Negative for chest pain, palpitations and leg swelling.  Gastrointestinal: Negative.   Genitourinary: Negative.   Skin: Negative.   Neurological: Positive for headaches. Negative for dizziness, tremors, seizures, syncope and speech difficulty.  Psychiatric/Behavioral: Negative.     Allergies  Nsaids  Home Medications   Prior to Admission medications   Medication Sig Start Date End Date Taking? Authorizing Provider  amoxicillin-clavulanate (AUGMENTIN) 875-125 MG per tablet Take 1 tablet by mouth every 12 (twelve) hours. 03/14/14   Janne Napoleon, NP  chlorproMAZINE (THORAZINE) 25 MG tablet Take 1 tablet by mouth as needed. 04/04/13   Historical Provider, MD  clonazePAM (KLONOPIN) 1 MG tablet Take 1 mg by mouth as needed for anxiety.    Historical Provider, MD  gabapentin (NEURONTIN) 300 MG capsule Take 600 mg by mouth at bedtime.     Historical Provider, MD  Gabapentin, PHN, (GRALISE) 600 MG TABS Take 600 mg by mouth daily after breakfast.    Historical Provider, MD  lamoTRIgine (LAMICTAL) 25 MG tablet Takes 2  07/11/13   Historical Provider, MD  Multiple Vitamins-Minerals (CENTRUM SILVER PO) Take 1 tablet by mouth daily.    Historical Provider, MD  OnabotulinumtoxinA (BOTOX IJ) Inject as directed every 3 (three) months.    Historical Provider, MD  predniSONE (DELTASONE) 20 MG tablet Take 3 tabs po on first day, 2 tabs second day, 2 tabs third day, 1 tab fourth day, 1 tab 5th day. Take with food. 03/14/14   Janne Napoleon, NP  Probiotic Product (PROBIOTIC PO) Take by mouth daily.    Historical Provider, MD  risperiDONE (RISPERDAL) 0.5 MG tablet as needed. 06/13/13   Historical Provider, MD  rizatriptan (MAXALT) 10 MG  tablet as needed. 07/29/13   Historical Provider, MD  SUMAtriptan Succinate 6 MG/0.5ML SOCT Inject into the skin as needed.    Historical Provider, MD  tiZANidine (ZANAFLEX) 4 MG tablet Take 1 tablet by mouth as needed. 04/05/13   Historical Provider, MD  traZODone (DESYREL) 100 MG tablet Take 100 mg by mouth at bedtime.    Historical Provider, MD  Vortioxetine HBr (BRINTELLIX) 5 MG TABS Take by mouth daily.    Historical Provider, MD   BP 107/67  Pulse 82  Temp(Src) 97.5 F (36.4 C) (Oral)  Resp 18  SpO2 97%  LMP 07/29/2012 Physical Exam  Nursing note and vitals reviewed. Constitutional: She is oriented to person, place, and time. She appears well-developed and well-nourished. No distress.  HENT:  Right Ear: External ear normal.  Left Ear: External ear normal.  Mouth/Throat: No oropharyngeal exudate.  OP with minor injection and small amt clear PND  TM's nl Direct tenderness over the forehead, paranasal and maxillary sinuses  Eyes: Conjunctivae and EOM are normal.  Neck: Normal range of motion. Neck supple.  Cardiovascular: Normal rate, regular rhythm and normal heart sounds.   Pulmonary/Chest: Effort normal. No respiratory distress.  Musculoskeletal: She exhibits no edema.  Lymphadenopathy:    She has no cervical adenopathy.  Neurological: She is alert and oriented to person, place, and time.  Skin: Skin is warm and dry.  Psychiatric: She has a normal mood and affect.    ED Course  Procedures (including critical care time) Labs Review Labs Reviewed - No data to display  Imaging Review No results found.   MDM   1. Acute recurrent pansinusitis   Augmentin bid Prednisone taper flonase Saline nasal spray Lots of fluids     Janne Napoleon, NP 03/14/14 1130

## 2014-03-14 NOTE — ED Provider Notes (Signed)
Medical screening examination/treatment/procedure(s) were performed by resident physician or non-physician practitioner and as supervising physician I was immediately available for consultation/collaboration.   Pauline Good MD.   Billy Fischer, MD 03/14/14 [phone]

## 2014-03-16 ENCOUNTER — Encounter (HOSPITAL_COMMUNITY): Payer: Self-pay | Admitting: Emergency Medicine

## 2014-04-02 ENCOUNTER — Other Ambulatory Visit: Payer: Self-pay | Admitting: Family Medicine

## 2014-04-02 DIAGNOSIS — R1031 Right lower quadrant pain: Secondary | ICD-10-CM

## 2014-04-08 ENCOUNTER — Ambulatory Visit
Admission: RE | Admit: 2014-04-08 | Discharge: 2014-04-08 | Disposition: A | Discharge status: Home / Self Care | Payer: BC Managed Care – PPO | Source: Ambulatory Visit | Attending: Family Medicine | Admitting: Family Medicine

## 2014-04-08 DIAGNOSIS — R1031 Right lower quadrant pain: Secondary | ICD-10-CM

## 2014-04-08 MED ORDER — IOHEXOL 300 MG/ML  SOLN
100.0000 mL | Freq: Once | INTRAMUSCULAR | Status: AC | PRN
  Administered 2014-04-08: 100 mL via INTRAVENOUS

## 2014-04-28 ENCOUNTER — Encounter (HOSPITAL_COMMUNITY): Payer: Self-pay | Admitting: *Deleted

## 2014-04-28 ENCOUNTER — Emergency Department (HOSPITAL_COMMUNITY)
Admission: EM | Admit: 2014-04-28 | Discharge: 2014-04-28 | Disposition: A | Discharge status: Home / Self Care | Payer: BC Managed Care – PPO | Source: Home / Self Care | Attending: Family Medicine | Admitting: Family Medicine

## 2014-04-28 DIAGNOSIS — M792 Neuralgia and neuritis, unspecified: Secondary | ICD-10-CM

## 2014-04-28 MED ORDER — OXYCODONE-ACETAMINOPHEN 5-325 MG PO TABS: 1.0000 | ORAL_TABLET | Freq: Four times a day (QID) | ORAL | Status: DC | PRN

## 2014-04-28 NOTE — Discharge Instructions (Signed)
See your doctor as discussed.

## 2014-04-28 NOTE — ED Provider Notes (Signed)
CSN: [id number]     Arrival date & time 04/28/14  1040 History   First MD Initiated Contact with Patient 04/28/14 1112     Chief Complaint  Patient presents with  . Abdominal Pain   (Consider location/radiation/quality/duration/timing/severity/associated sxs/prior Treatment) Patient is a 53 y.o. female presenting with abdominal pain. The history is provided by the patient and the spouse.  Abdominal Pain Pain location:  Suprapubic Pain quality: burning and shooting   Pain severity:  Moderate Onset quality:  Gradual Duration:  12 weeks Progression:  Waxing and waning Chronicity:  Recurrent Context: previous surgery   Context comment:  S/p abdominoplasty now with paroxysmal scar area pain on right , has had extensive x-ray eval with no dx made, results neg, no gi or gu sx, mult specialists unable to fix. Relieved by:  Nothing Associated symptoms: no fever, no hematochezia, no hematuria, no melena, no nausea and no vomiting   Risk factors: multiple surgeries     Past Medical History  Diagnosis Date  . Headache(784.0)   . Anxiety   . Depression   . PONV (postoperative nausea and vomiting)   . Cystitis, interstitial     HAD BLADDER SX FOR SAME  . IBS (irritable bowel syndrome)     TAKES PROBIOTICS  . Restless leg syndrome   . Migraine   . Leg pain   . Varicose veins    Past Surgical History  Procedure Laterality Date  . Vascular surgery      VARICOSE VEINS  . Nasal septum surgery    . Lasik    . Bladder surgery      FOR IC  . Breast reduction surgery  10/10/2011    Procedure: MAMMARY REDUCTION  (BREAST);  Surgeon: Charlene Brooke, MD;  Location: [facility];  Service: Plastics;  Laterality: Bilateral;  . Breast biopsy Left 02-26-12    negative  . Abdominoplasty  4/14   Family History  Problem Relation Age of Onset  . Osteoporosis Mother   . Atrial fibrillation Mother   . Diabetes Father   . Hypertension Father   . Breast cancer Maternal  Grandmother   . Cancer Maternal Grandmother     ovarian  . Diabetes Paternal Grandmother    History  Substance Use Topics  . Smoking status: Never Smoker   . Smokeless tobacco: Never Used  . Alcohol Use: No   OB History    Gravida Para Term Preterm AB TAB SAB Ectopic Multiple Living   2 2 2       2      Review of Systems  Constitutional: Negative.  Negative for fever.  Gastrointestinal: Positive for abdominal pain. Negative for nausea, vomiting, melena and hematochezia.  Genitourinary: Negative.  Negative for hematuria.    Allergies  Nsaids  Home Medications   Prior to Admission medications   Medication Sig Start Date End Date Taking? Authorizing Provider  amoxicillin-clavulanate (AUGMENTIN) 875-125 MG per tablet Take 1 tablet by mouth every 12 (twelve) hours. 03/14/14   Janne Napoleon, NP  chlorproMAZINE (THORAZINE) 25 MG tablet Take 1 tablet by mouth as needed. 04/04/13   Historical Provider, MD  clonazePAM (KLONOPIN) 1 MG tablet Take 1 mg by mouth as needed for anxiety.    Historical Provider, MD  gabapentin (NEURONTIN) 300 MG capsule Take 600 mg by mouth at bedtime.     Historical Provider, MD  Gabapentin, PHN, (GRALISE) 600 MG TABS Take 600 mg by mouth daily after breakfast.    Historical Provider,  MD  lamoTRIgine (LAMICTAL) 25 MG tablet Takes 2 07/11/13   Historical Provider, MD  Multiple Vitamins-Minerals (CENTRUM SILVER PO) Take 1 tablet by mouth daily.    Historical Provider, MD  OnabotulinumtoxinA (BOTOX IJ) Inject as directed every 3 (three) months.    Historical Provider, MD  oxyCODONE-acetaminophen (PERCOCET/ROXICET) 5-325 MG per tablet Take 1 tablet by mouth every 6 (six) hours as needed for moderate pain or severe pain. 04/28/14   Billy Fischer, MD  predniSONE (DELTASONE) 20 MG tablet Take 3 tabs po on first day, 2 tabs second day, 2 tabs third day, 1 tab fourth day, 1 tab 5th day. Take with food. 03/14/14   Janne Napoleon, NP  Probiotic Product (PROBIOTIC PO) Take by  mouth daily.    Historical Provider, MD  risperiDONE (RISPERDAL) 0.5 MG tablet as needed. 06/13/13   Historical Provider, MD  rizatriptan (MAXALT) 10 MG tablet as needed. 07/29/13   Historical Provider, MD  SUMAtriptan Succinate 6 MG/0.5ML SOCT Inject into the skin as needed.    Historical Provider, MD  tiZANidine (ZANAFLEX) 4 MG tablet Take 1 tablet by mouth as needed. 04/05/13   Historical Provider, MD  traZODone (DESYREL) 100 MG tablet Take 100 mg by mouth at bedtime.    Historical Provider, MD  Vortioxetine HBr (BRINTELLIX) 5 MG TABS Take by mouth daily.    Historical Provider, MD   BP 111/72 mmHg  Pulse 72  Temp(Src) 98.2 F (36.8 C) (Oral)  Resp 12  SpO2 96%  LMP 07/29/2012 Physical Exam  Constitutional: She is oriented to person, place, and time. She appears well-developed and well-nourished. She appears distressed.  Abdominal: Soft. Bowel sounds are normal. She exhibits no distension and no mass. There is tenderness. There is no rigidity, no rebound, no guarding, no CVA tenderness, no tenderness at McBurney's point and negative Murphy's sign.    Neurological: She is alert and oriented to person, place, and time.  Skin: Skin is warm and dry.  Nursing note and vitals reviewed.   ED Course  Procedures (including critical care time) Labs Review Labs Reviewed - No data to display  Imaging Review No results found.   MDM   1. Paroxysmal nerve pain        Billy Fischer, MD 04/28/14 1214

## 2014-04-28 NOTE — ED Notes (Signed)
Pt  Reports  r  Side     Pt  Has   Had           These   Symptoms            For  About  1  Month       Worse  Yesterday          No  appetitie        No  Vomiting  No  Diarrhea          History  Of  Ulcerative  Colitis

## 2014-07-24 ENCOUNTER — Other Ambulatory Visit (HOSPITAL_COMMUNITY): Payer: Self-pay

## 2014-07-27 ENCOUNTER — Ambulatory Visit (HOSPITAL_COMMUNITY): Payer: BLUE CROSS/BLUE SHIELD | Attending: Cardiology | Admitting: Radiology

## 2014-07-27 ENCOUNTER — Other Ambulatory Visit (HOSPITAL_COMMUNITY): Payer: Self-pay | Admitting: Family Medicine

## 2014-07-27 DIAGNOSIS — R55 Syncope and collapse: Secondary | ICD-10-CM | POA: Diagnosis not present

## 2014-07-27 DIAGNOSIS — R0600 Dyspnea, unspecified: Secondary | ICD-10-CM

## 2014-07-27 DIAGNOSIS — R0609 Other forms of dyspnea: Secondary | ICD-10-CM

## 2014-07-27 DIAGNOSIS — R069 Unspecified abnormalities of breathing: Secondary | ICD-10-CM

## 2014-07-27 NOTE — Progress Notes (Signed)
Echocardiogram performed.  

## 2014-08-05 ENCOUNTER — Other Ambulatory Visit: Payer: Self-pay | Admitting: Certified Nurse Midwife

## 2014-08-05 DIAGNOSIS — Z1231 Encounter for screening mammogram for malignant neoplasm of breast: Secondary | ICD-10-CM

## 2014-08-06 ENCOUNTER — Encounter: Payer: Self-pay | Admitting: Certified Nurse Midwife

## 2014-08-06 ENCOUNTER — Ambulatory Visit (INDEPENDENT_AMBULATORY_CARE_PROVIDER_SITE_OTHER): Payer: BLUE CROSS/BLUE SHIELD | Admitting: Certified Nurse Midwife

## 2014-08-06 VITALS — BP 104/64 | HR 68 | Resp 16 | Ht 65.5 in | Wt 179.0 lb

## 2014-08-06 DIAGNOSIS — R319 Hematuria, unspecified: Secondary | ICD-10-CM

## 2014-08-06 DIAGNOSIS — Z01419 Encounter for gynecological examination (general) (routine) without abnormal findings: Secondary | ICD-10-CM | POA: Diagnosis not present

## 2014-08-06 DIAGNOSIS — Z0000 Encounter for general adult medical examination without abnormal findings: Secondary | ICD-10-CM

## 2014-08-06 DIAGNOSIS — Z124 Encounter for screening for malignant neoplasm of cervix: Secondary | ICD-10-CM

## 2014-08-06 LAB — POCT URINALYSIS DIPSTICK
Bilirubin, UA: NEGATIVE
Glucose, UA: NEGATIVE
Ketones, UA: NEGATIVE
Leukocytes, UA: NEGATIVE
Nitrite, UA: NEGATIVE
Protein, UA: NEGATIVE
Urobilinogen, UA: NEGATIVE
pH, UA: 5

## 2014-08-06 NOTE — Progress Notes (Signed)
54 y.o. [id number] Married  Caucasian Fe here for annual exam. Menopausal no vaginal bleeding or vaginal dryness. Menopausal no HRT. Sees Dr. Justin Mend for PCP for management of medications. Sees Neurology for headache management and meds. Had varicose vein injection for blockage doing well. Patient feels she is learning to live with the current pain of undetermined etiology and fatigue. She is walking daily now for short periods of time without problems Saw cardiology and diagnosed with MVP, under evaluation now. Denies any urinary symptoms, history of micor hematuria with IC history No other health concerns today.  Patient's last menstrual period was 07/29/2012.          Sexually active: Yes.    The current method of family planning is vasectomy.    Exercising: No.  exercise Smoker:  no  Health Maintenance: Pap: 07-29-12 neg MMG:  08-08-13 category c, birads 1: neg Colonoscopy:  2013 BMD:   none TDaP:  2009 Labs: Poct urine-rbc 1+ Self breast exam: monthly   reports that she has never smoked. She has never used smokeless tobacco. She reports that she does not drink alcohol or use illicit drugs.  Past Medical History  Diagnosis Date  . Headache(784.0)   . Anxiety   . Depression   . PONV (postoperative nausea and vomiting)   . Cystitis, interstitial     HAD BLADDER SX FOR SAME  . IBS (irritable bowel syndrome)     TAKES PROBIOTICS  . Restless leg syndrome   . Migraine   . Leg pain   . Varicose veins   . Fainting spell   . Fibromyalgia     Past Surgical History  Procedure Laterality Date  . Vascular surgery      VARICOSE VEINS  . Nasal septum surgery    . Lasik    . Bladder surgery      FOR IC  . Breast reduction surgery  10/10/2011    Procedure: MAMMARY REDUCTION  (BREAST);  Surgeon: Charlene Brooke, MD;  Location: [facility];  Service: Plastics;  Laterality: Bilateral;  . Breast biopsy Left 02-26-12    negative  . Abdominoplasty  4/14    Current Outpatient  Prescriptions  Medication Sig Dispense Refill  . chlorproMAZINE (THORAZINE) 25 MG tablet Take 1 tablet by mouth as needed.    . clonazePAM (KLONOPIN) 0.5 MG tablet Take 0.5 mg by mouth daily.    [name] lamoTRIgine (LAMICTAL) 100 MG tablet Take 100 mg by mouth daily.  0  . lidocaine (XYLOCAINE) 4 % external solution Apply topically.    [name] MAXALT-MLT 10 MG disintegrating tablet   0  . Multiple Vitamins-Minerals (CENTRUM SILVER PO) Take 1 tablet by mouth daily.    . Probiotic Product (PROBIOTIC PO) Take by mouth daily.    . risperiDONE (RISPERDAL) 0.5 MG tablet as needed.    . sertraline (ZOLOFT) 25 MG tablet Take 37.5 mg by mouth daily.  0  . SUMAtriptan 6 MG/0.5ML SOAJ   0  . tiZANidine (ZANAFLEX) 4 MG tablet Take 1 tablet by mouth as needed.    . traZODone (DESYREL) 100 MG tablet Take 100 mg by mouth at bedtime.     No current facility-administered medications for this visit.    Family History  Problem Relation Age of Onset  . Osteoporosis Mother   . Atrial fibrillation Mother   . Diabetes Father   . Hypertension Father   . Breast cancer Maternal Grandmother   . Cancer Maternal Grandmother  ovarian  . Diabetes Paternal Grandmother     ROS:  Pertinent items are noted in HPI.  Otherwise, a comprehensive ROS was negative.  Exam:   BP 104/64 mmHg  Pulse 68  Resp 16  Ht 5' 5.5" (1.664 m)  Wt 179 lb (81.194 kg)  BMI 29.32 kg/m2  LMP 07/29/2012 Height: 5' 5.5" (166.4 cm) Ht Readings from Last 3 Encounters:  08/06/14 5' 5.5" (1.664 m)  02/02/14 5\' 5"  (1.651 m)  01/26/14 5\' 5"  (1.651 m)    General appearance: alert, cooperative and appears stated age Head: Normocephalic, without obvious abnormality, atraumatic Neck: no adenopathy, supple, symmetrical, trachea midline and thyroid normal to inspection and palpation Lungs: clear to auscultation bilaterally Breasts: normal appearance, no masses or tenderness, No nipple retraction or dimpling, No nipple discharge or bleeding, No  axillary or supraclavicular adenopathy, reduction scarring Heart: regular rate and rhythm Abdomen: soft, non-tender; no masses,  no organomegaly Extremities: extremities normal, atraumatic, no cyanosis or edema Skin: Skin color, texture, turgor normal. No rashes or lesions Lymph nodes: Cervical, supraclavicular, and axillary nodes normal. No abnormal inguinal nodes palpated Neurologic: Grossly normal   Pelvic: External genitalia:  no lesions              Urethra:  normal appearing urethra with no masses, tenderness or lesions              Bartholin's and Skene's: normal                 Vagina: normal appearing vagina with normal color and discharge, no lesions              Cervix: normal, parous, non tender              Pap taken: Yes.   Bimanual Exam:  Uterus:  normal size, contour, position, consistency, mobility, non-tender              Adnexa: normal adnexa and no mass, fullness, tenderness               Rectovaginal: Confirms               Anus:  normal sphincter tone, no lesions  Chaperone present: Yes  A:  Well Woman with normal exam  Menopausal no HRT  Chronic fatigue with medical management with PCP  Migraine headaches with aura with Neurology management  MVP under cardiology management  History of IC with RBC's R/O UTI  P:   Reviewed health and wellness pertinent to exam  Aware of need for evaluation if vaginal bleeding  Continue follow up with MD's as indicated  Lab Urine micro  Pap smear taken today with HPVHR   counseled on breast self exam, mammography screening, menopause, adequate intake of calcium and vitamin D, diet and exercise  return annually or prn  An After Visit Summary was printed and given to the patient.

## 2014-08-06 NOTE — Patient Instructions (Addendum)

## 2014-08-07 LAB — URINALYSIS, MICROSCOPIC ONLY: Casts: NONE SEEN

## 2014-08-10 LAB — IPS PAP TEST WITH HPV

## 2014-08-11 ENCOUNTER — Telehealth: Payer: Self-pay

## 2014-08-11 NOTE — Telephone Encounter (Signed)
lmtcb

## 2014-08-11 NOTE — Telephone Encounter (Signed)
-----   Message from Regina Eck, CNM sent at 08/11/2014  3:11 PM EDT ----- Notify patient that her micro urine showed no hematuria, but many bacteria, needs urine culture. She needs to come by and collect a urine for [place] to send to lab.  Pap smear negative HPVHR not detected 02

## 2014-08-12 NOTE — Telephone Encounter (Signed)
Left message to call back  

## 2014-08-12 NOTE — Telephone Encounter (Signed)
Returning call.

## 2014-08-12 NOTE — Progress Notes (Signed)
Reviewed personally.  M. Suzanne Hasani Diemer, MD.  

## 2014-08-13 NOTE — Telephone Encounter (Signed)
Patient is returning a call to [place]. She states it is okay to left a detailed message to avoid playing phone tag.

## 2014-08-14 ENCOUNTER — Ambulatory Visit (INDEPENDENT_AMBULATORY_CARE_PROVIDER_SITE_OTHER): Payer: BLUE CROSS/BLUE SHIELD | Admitting: *Deleted

## 2014-08-14 VITALS — BP 104/78 | HR 72 | Ht 65.5 in | Wt 182.2 lb

## 2014-08-14 DIAGNOSIS — R319 Hematuria, unspecified: Secondary | ICD-10-CM

## 2014-08-14 NOTE — Progress Notes (Signed)
Patient is here for urine culture only.  Urine culture drawn up and sent to lab. Patient is aware that someone will call her with the results as soon as they are in.

## 2014-08-15 LAB — URINE CULTURE
Colony Count: NO GROWTH
Organism ID, Bacteria: NO GROWTH

## 2014-08-17 NOTE — Telephone Encounter (Signed)
Pt was notified by kelly nix,cma

## 2014-08-19 ENCOUNTER — Ambulatory Visit (HOSPITAL_COMMUNITY)
Admission: RE | Admit: 2014-08-19 | Discharge: 2014-08-19 | Disposition: A | Discharge status: Home / Self Care | Payer: BLUE CROSS/BLUE SHIELD | Source: Ambulatory Visit | Attending: Certified Nurse Midwife | Admitting: Certified Nurse Midwife

## 2014-08-19 DIAGNOSIS — Z1231 Encounter for screening mammogram for malignant neoplasm of breast: Secondary | ICD-10-CM

## 2014-09-22 ENCOUNTER — Encounter: Payer: Self-pay | Admitting: Cardiology

## 2014-09-22 ENCOUNTER — Ambulatory Visit (INDEPENDENT_AMBULATORY_CARE_PROVIDER_SITE_OTHER): Payer: BLUE CROSS/BLUE SHIELD | Admitting: Cardiology

## 2014-09-22 VITALS — BP 110/74 | HR 72 | Ht 65.0 in | Wt 181.1 lb

## 2014-09-22 DIAGNOSIS — R55 Syncope and collapse: Secondary | ICD-10-CM | POA: Diagnosis not present

## 2014-09-22 DIAGNOSIS — R0609 Other forms of dyspnea: Secondary | ICD-10-CM | POA: Diagnosis not present

## 2014-09-22 DIAGNOSIS — R0600 Dyspnea, unspecified: Secondary | ICD-10-CM

## 2014-09-22 DIAGNOSIS — I341 Nonrheumatic mitral (valve) prolapse: Secondary | ICD-10-CM | POA: Diagnosis not present

## 2014-09-22 NOTE — Patient Instructions (Signed)
Medication Instructions:  Your physician recommends that you continue on your current medications as directed. Please refer to the Current Medication list given to you today.  Labwork: none  Testing/Procedures: A chest x-ray takes a picture of the organs and structures inside the chest, including the heart, lungs, and blood vessels. This test can show several things, including, whether the heart is enlarges; whether fluid is building up in the lungs; and whether pacemaker / defibrillator leads are still in place.  IMAGING AT [facility]   Follow-Up: Your physician wants you to follow-up in: 1 year ov/ekg  You will receive a reminder letter in the mail two months in advance. If you don't receive a letter, please call our office to schedule the follow-up appointment.

## 2014-09-22 NOTE — Progress Notes (Signed)
Cardiology Office Note   Date:  09/22/2014   ID:  Marisa Hamilton, DOB [birth year], MRN [id number]  PCP:  Frederich Chick, MD  Cardiologist: Cassell Clement MD  No chief complaint on file.     History of Present Illness: Marisa Hamilton is a 54 y.o. female who presents for evaluation of recent diagnosis of mitral valve prolapse and history of near syncope.  This pleasant woman is a medical patient of Dr. Shirlean Mylar.  The patient has a history of chronically low blood pressure.  She states that frequently her blood pressure is in the 90/50 range.  Despite the low blood pressure she is normally asymptomatic.  However, several months ago she was [place]ing in the mountains with some girlfriends.  The patient had an episode of severe dizziness and felt like she was going to faint 1 morning as she was bending over to get something out of her makeup kit which was on the floor.  She was so weak that she had to crawl on her hands and knees to the door and called for help.  The other gross came running.  One of the girls was a nurse and checked her pulse and stated that it was extremely weak.  She was given several bottles of water to drink and her symptoms gradually resolved.  After returning to his pro she had an echocardiogram which showed:Normal LV function; grade 1 diastolic dysfunction; mildly thickened MV with mild prolapse of posterior MV leaflet; mild MR; trace AI. The patient has never been told of a cardiac murmur.  She does not have any history of exertional chest pain.  She has noted some shortness of breath climbing hills.  She has attributed some of this to the fact that she has gained about 20 pounds in the past 2 years.  She attributes some of her weight gain to some of the medication that she has to take for her depression and her frequent migraine headaches. She does not have any history of orthopnea or paroxysmal nocturnal dyspnea.  She has a history of chronic venous insufficiency of her  lower extremities.  She wears support hose year-round. Her family history reveals that her father is deceased at age 60 and had diabetes mellitus and died after complications from a fall.  The patient's mother is living at age 51 and has atrial fibrillation and a history of heart failure.   Past Medical History  Diagnosis Date  . Headache(784.0)   . Anxiety   . Depression   . PONV (postoperative nausea and vomiting)   . Cystitis, interstitial     HAD BLADDER SX FOR SAME  . IBS (irritable bowel syndrome)     TAKES PROBIOTICS  . Restless leg syndrome   . Migraine   . Leg pain   . Varicose veins   . Fainting spell   . Fibromyalgia   . MVP (mitral valve prolapse)     Past Surgical History  Procedure Laterality Date  . Vascular surgery      VARICOSE VEINS  . Nasal septum surgery    . Lasik    . Bladder surgery      FOR IC  . Breast reduction surgery  10/10/2011    Procedure: MAMMARY REDUCTION  (BREAST);  Surgeon: Karie Fetch, MD;  Location: [facility];  Service: Plastics;  Laterality: Bilateral;  . Breast biopsy Left 02-26-12    negative  . Abdominoplasty  4/14     Current Outpatient  Prescriptions  Medication Sig Dispense Refill  . Cetirizine HCl (ZYRTEC ALLERGY PO) Take 10 mg by mouth daily.    . chlorproMAZINE (THORAZINE) 25 MG tablet Take 1 tablet by mouth as needed.    . clonazePAM (KLONOPIN) 1 MG tablet Take 1 mg by mouth at bedtime.  0  . lamoTRIgine (LAMICTAL) 100 MG tablet Take 100 mg by mouth daily.  0  . lidocaine (XYLOCAINE) 4 % external solution Apply topically.    [name] MAXALT-MLT 10 MG disintegrating tablet   0  . Multiple Vitamins-Minerals (CENTRUM SILVER PO) Take 1 tablet by mouth daily.    . Probiotic Product (PROBIOTIC PO) Take by mouth daily.    . sertraline (ZOLOFT) 25 MG tablet Take 37.5 mg by mouth daily.  0  . SUMAtriptan 6 MG/0.5ML SOAJ   0  . tiZANidine (ZANAFLEX) 4 MG tablet Take 1 tablet by mouth as needed.    . traZODone  (DESYREL) 100 MG tablet Take 100 mg by mouth at bedtime.     No current facility-administered medications for this visit.    Allergies:   Nsaids    Social History:  The patient  reports that she has never smoked. She has never used smokeless tobacco. She reports that she does not drink alcohol or use illicit drugs.   Family History:  The patient's family history includes Atrial fibrillation in her mother; Breast cancer in her maternal grandmother; Cancer in her maternal grandmother; Diabetes in her father and paternal grandmother; Hypertension in her father; Migraines in her brother; Osteoporosis in her mother.    ROS:  Please see the history of present illness.   Otherwise, review of systems are positive for none.   All other systems are reviewed and negative.    PHYSICAL EXAM: VS:  BP 110/74 mmHg  Pulse 72  Ht $R'5\' 5"'Lx$  (1.651 m)  Wt 181 lb 1.9 oz (82.155 kg)  BMI 30.14 kg/m2  LMP 07/29/2012 , BMI Body mass index is 30.14 kg/(m^2). GEN: Well nourished, well developed, in no acute distress HEENT: normal Neck: no JVD, carotid bruits, or masses Cardiac: RRR; no murmurs, rubs, or gallops,no edema .  I do not hear any click Respiratory:  clear to auscultation bilaterally, normal work of breathing GI: soft, nontender, nondistended, + BS MS: no deformity or atrophy Skin: warm and dry, no rash Neuro:  Strength and sensation are intact Psych: euthymic mood, full affect   EKG:  EKG is ordered today. The ekg ordered today demonstrates sinus rhythm.  Within normal limits.   Recent Labs: 12/10/2013: Hemoglobin 14.1; Platelets 239    Lipid Panel    Component Value Date/Time   CHOL 172 08/04/2013 1100   TRIG 152* 12/08/2013 1131   HDL 64 08/04/2013 1100   CHOLHDL 2.7 08/04/2013 1100   VLDL 34 08/04/2013 1100   LDLCALC 74 08/04/2013 1100      Wt Readings from Last 3 Encounters:  09/22/14 181 lb 1.9 oz (82.155 kg)  08/14/14 182 lb 3.2 oz (82.645 kg)  08/06/14 179 lb (81.194 kg)           ASSESSMENT AND PLAN:  1.  Episode of near syncope 2 months ago, no recurrence.  Suspect that the episode was multifactorial including mild dehydration superimposed upon a chronically low blood pressure. 2.  Mild mitral valve prolapse with mild mitral regurgitation by echocardiogram 3.  Normal systolic function with ejection fraction 55-60%.  Grade 1 diastolic dysfunction. 4.  Chronic venous insufficiency of lower extremities 5.  Chronic migraine headaches.  She states that she is about to enter a [employer] study looking at a monthly shot to prevent migraine.  Recommendation: We will get a chest x-ray on her to look at heart size and lungs.  Clinically she does not appear to be in any degree of congestive heart failure.  She does have chronically low blood pressure so we would not wish to start her on diuretics unless there was a clear indication. In terms of her mitral valve prolapse, this also is very minimal by echocardiogram.  At the present time she is not having enough symptoms referable to her mitral valve prolapse to warrant beta blocker therapy.  She does not require SBE prophylaxis by current criteria. I have encouraged her to continue her plan to walk 10,000 steps a day.  She is wearing a fit bit given to her by her husband.  This will also help her in her goal to lose weight.  We talked about the natural history of mitral valve prolapse.  Typically this does not progress quickly.  I have asked her to let me check her again in about one year for a follow-up office visit and EKG.  Many thanks for the opportunity to see this pleasant woman with you.    Current medicines are reviewed at length with the patient today.  The patient does not have concerns regarding medicines.  The following changes have been made:  no change  Labs/ tests ordered today include:  Orders Placed This Encounter  Procedures  . DG Chest 2 View  . EKG 12-Lead      Signed, Darlin Coco  MD 09/22/2014 6:38 PM    [facility] [address], [place], [place]  [address] Phone: [phone]; Fax: [phone]

## 2014-09-29 ENCOUNTER — Ambulatory Visit
Admission: RE | Admit: 2014-09-29 | Discharge: 2014-09-29 | Disposition: A | Discharge status: Home / Self Care | Payer: BLUE CROSS/BLUE SHIELD | Source: Ambulatory Visit | Attending: Cardiology | Admitting: Cardiology

## 2014-09-29 DIAGNOSIS — R0600 Dyspnea, unspecified: Secondary | ICD-10-CM

## 2014-09-29 DIAGNOSIS — R0609 Other forms of dyspnea: Secondary | ICD-10-CM

## 2014-10-02 ENCOUNTER — Telehealth: Payer: Self-pay | Admitting: Cardiology

## 2014-10-02 NOTE — Telephone Encounter (Signed)
Advised patient of results.  

## 2014-10-02 NOTE — Telephone Encounter (Signed)
New problem   Pt returning call concerning getting results of her cxr. Pt stated you can leave results on her vm.

## 2014-10-02 NOTE — Telephone Encounter (Signed)
-----   Message from Darlin Coco, MD sent at 09/29/2014  6:17 PM EDT ----- Please report.  The chest x-ray is normal.  The heart size was normal.  No evidence of heart failure.

## 2015-08-09 ENCOUNTER — Ambulatory Visit: Payer: BLUE CROSS/BLUE SHIELD | Admitting: Certified Nurse Midwife

## 2015-08-10 ENCOUNTER — Ambulatory Visit: Payer: BLUE CROSS/BLUE SHIELD | Admitting: Certified Nurse Midwife

## 2015-08-19 ENCOUNTER — Encounter: Payer: Self-pay | Admitting: Certified Nurse Midwife

## 2015-08-19 ENCOUNTER — Ambulatory Visit (INDEPENDENT_AMBULATORY_CARE_PROVIDER_SITE_OTHER): Payer: BLUE CROSS/BLUE SHIELD | Admitting: Certified Nurse Midwife

## 2015-08-19 VITALS — BP 98/60 | HR 70 | Resp 16 | Ht 65.0 in | Wt 186.0 lb

## 2015-08-19 DIAGNOSIS — Z01419 Encounter for gynecological examination (general) (routine) without abnormal findings: Secondary | ICD-10-CM

## 2015-08-19 DIAGNOSIS — Z0000 Encounter for general adult medical examination without abnormal findings: Secondary | ICD-10-CM

## 2015-08-19 LAB — HIV ANTIBODY (ROUTINE TESTING W REFLEX): HIV 1&2 Ab, 4th Generation: NONREACTIVE

## 2015-08-19 LAB — HEPATITIS C ANTIBODY: HCV Ab: NEGATIVE

## 2015-08-19 NOTE — Patient Instructions (Signed)

## 2015-08-19 NOTE — Progress Notes (Signed)
55 y.o. G56P2002 Married  Caucasian Fe here for annual exam. Menopausal no HRT. Denies vaginal bleeding, but having problems with  vaginal dryness. Did not try coconut oil as discussed at last aex. Fibromyalgia flaring all the time now, especially with heat and cold changes.  Seeing MD for management, using Thorazine if needed. Sees PCP yearly and for migraine management/anxiety and depression. Medications working fairly well. Having problems with deodorant working, has tried Animator and still perspires. Plans to try natural crystal to see if it will help. Starting PT for chronic back pain. No other health issues today. Planning trip to [place] in the fall.  Patient's last menstrual period was 07/29/2012.          Sexually active: Yes.    The current method of family planning is vasectomy.    Exercising: No.  exercise Smoker:  no  Health Maintenance: Pap:  08-06-14 neg HPV HR neg MMG:  08-20-14 category c density,birads 1:neg Colonoscopy: 2013 BMD:   none TDaP:  2009 Shingles: 2013 Pneumonia: no Hep C and HIV:not done Labs: pcp Self breast exam: done monthly   reports that she has never smoked. She has never used smokeless tobacco. She reports that she drinks about 0.6 oz of alcohol per week. She reports that she does not use illicit drugs.  Past Medical History  Diagnosis Date  . Headache(784.0)   . Anxiety   . Depression   . PONV (postoperative nausea and vomiting)   . Cystitis, interstitial     HAD BLADDER SX FOR SAME  . IBS (irritable bowel syndrome)     TAKES PROBIOTICS  . Restless leg syndrome   . Migraine   . Leg pain   . Varicose veins   . Fainting spell   . Fibromyalgia   . MVP (mitral valve prolapse)     Past Surgical History  Procedure Laterality Date  . Vascular surgery      VARICOSE VEINS  . Nasal septum surgery    . Lasik    . Bladder surgery      FOR IC  . Breast reduction surgery  10/10/2011    Procedure: MAMMARY REDUCTION  (BREAST);  Surgeon: Charlene Brooke, MD;  Location: [place];  Service: Plastics;  Laterality: Bilateral;  . Breast biopsy Left 02-26-12    negative  . Abdominoplasty  4/14    Current Outpatient Prescriptions  Medication Sig Dispense Refill  . CALCIUM PO Take by mouth daily.    . Cetirizine HCl (ZYRTEC ALLERGY PO) Take 10 mg by mouth daily.    . chlorproMAZINE (THORAZINE) 25 MG tablet Take 1 tablet by mouth as needed.    . clonazePAM (KLONOPIN) 1 MG tablet Take 1 mg by mouth at bedtime.  0  . lamoTRIgine (LAMICTAL) 100 MG tablet Take 100 mg by mouth daily.  0  . lidocaine (XYLOCAINE) 4 % external solution Apply topically as needed.     [name] MAXALT-MLT 10 MG disintegrating tablet as needed.   0  . Multiple Vitamins-Minerals (CENTRUM SILVER PO) Take 1 tablet by mouth daily.    . Probiotic Product (PROBIOTIC PO) Take by mouth daily.    . sertraline (ZOLOFT) 25 MG tablet Take by mouth daily.   0  . SUMAtriptan 6 MG/0.5ML SOAJ   0  . tiZANidine (ZANAFLEX) 4 MG tablet Take 1 tablet by mouth as needed.    . traZODone (DESYREL) 100 MG tablet Take 100 mg by mouth at bedtime.  No current facility-administered medications for this visit.    Family History  Problem Relation Age of Onset  . Osteoporosis Mother   . Atrial fibrillation Mother   . Diabetes Father   . Hypertension Father   . Breast cancer Maternal Grandmother   . Cancer Maternal Grandmother     ovarian  . Diabetes Paternal Grandmother   . Migraines Brother     CHRONIC    ROS:  Pertinent items are noted in HPI.  Otherwise, a comprehensive ROS was negative.  Exam:   BP 98/60 mmHg  Pulse 70  Resp 16  Ht 5\' 5"  (1.651 m)  Wt 186 lb (84.369 kg)  BMI 30.95 kg/m2  LMP 07/29/2012 Height: 5\' 5"  (165.1 cm) Ht Readings from Last 3 Encounters:  08/19/15 5\' 5"  (1.651 m)  09/22/14 5\' 5"  (1.651 m)  08/14/14 5' 5.5" (1.664 m)    General appearance: alert, cooperative and appears stated age Head: Normocephalic, without obvious  abnormality, atraumatic Neck: no adenopathy, supple, symmetrical, trachea midline and thyroid normal to inspection and palpation Lungs: clear to auscultation bilaterally Breasts: normal appearance, no masses or tenderness, No nipple retraction or dimpling, No nipple discharge or bleeding, No axillary or supraclavicular adenopathy Heart: regular rate and rhythm Abdomen: soft, non-tender; no masses,  no organomegaly Extremities: extremities normal, atraumatic, no cyanosis or edema Skin: Skin color, texture, turgor normal. No rashes or lesions Lymph nodes: Cervical, supraclavicular, and axillary nodes normal. No abnormal inguinal nodes palpated Neurologic: Grossly normal   Pelvic: External genitalia:  no lesions              Urethra:  normal appearing urethra with no masses, tenderness or lesions              Bartholin's and Skene's: normal                 Vagina: dry appearing vagina with normal color and scant discharge, no lesions, non tender              Cervix: normal, non tender, no lesions              Pap taken: No. Bimanual Exam:  Uterus:  normal size, contour, position, consistency, mobility, non-tender              Adnexa: normal adnexa and no mass, fullness, tenderness               Rectovaginal: Confirms               Anus:  normal sphincter tone, no lesions  Chaperone present: yes  A:  Well Woman with normal exam  Menopausal no HRT  Vaginal dryness  Fibromyalgia,anxiety/depression/migraine management with MD  Screening labs  P:   Reviewed health and wellness pertinent to exam  Aware of need to evaluate if vaginal bleeding  Discussed daily use of coconut oil at hs and prior to sexual activity. Discussed applicator use for insertion with instructions. Will advise if no change.  Continue follow up as indicated with MD  Lab HIV,Hep C  Pap smear as above not taken    counseled on breast self exam, mammography screening, menopause, adequate intake of calcium and vitamin  D, diet and exercise  return annually or prn  An After Visit Summary was printed and given to the patient.

## 2015-08-24 NOTE — Progress Notes (Signed)
Encounter reviewed Nimrod Wendt, MD   

## 2015-10-13 ENCOUNTER — Other Ambulatory Visit: Payer: Self-pay | Admitting: Family Medicine

## 2015-10-13 DIAGNOSIS — M545 Low back pain: Secondary | ICD-10-CM

## 2015-10-14 ENCOUNTER — Ambulatory Visit
Admission: RE | Admit: 2015-10-14 | Discharge: 2015-10-14 | Disposition: A | Discharge status: Home / Self Care | Payer: BLUE CROSS/BLUE SHIELD | Source: Ambulatory Visit | Attending: Family Medicine | Admitting: Family Medicine

## 2015-10-14 DIAGNOSIS — M545 Low back pain: Secondary | ICD-10-CM

## 2015-10-19 ENCOUNTER — Other Ambulatory Visit: Payer: Self-pay | Admitting: Gastroenterology

## 2015-10-19 DIAGNOSIS — K625 Hemorrhage of anus and rectum: Secondary | ICD-10-CM

## 2015-10-19 DIAGNOSIS — R197 Diarrhea, unspecified: Secondary | ICD-10-CM

## 2015-10-19 DIAGNOSIS — R1031 Right lower quadrant pain: Secondary | ICD-10-CM

## 2015-10-22 HISTORY — PX: COLONOSCOPY: SHX174

## 2015-10-25 ENCOUNTER — Ambulatory Visit
Admission: RE | Admit: 2015-10-25 | Discharge: 2015-10-25 | Disposition: A | Discharge status: Home / Self Care | Payer: BLUE CROSS/BLUE SHIELD | Source: Ambulatory Visit | Attending: Gastroenterology | Admitting: Gastroenterology

## 2015-10-25 DIAGNOSIS — R1031 Right lower quadrant pain: Secondary | ICD-10-CM

## 2015-10-25 DIAGNOSIS — K625 Hemorrhage of anus and rectum: Secondary | ICD-10-CM

## 2015-10-25 DIAGNOSIS — R197 Diarrhea, unspecified: Secondary | ICD-10-CM

## 2015-10-25 MED ORDER — IOPAMIDOL (ISOVUE-300) INJECTION 61%
100.0000 mL | Freq: Once | INTRAVENOUS | Status: AC | PRN
  Administered 2015-10-25: 100 mL via INTRAVENOUS

## 2015-11-19 ENCOUNTER — Telehealth: Payer: Self-pay | Admitting: Certified Nurse Midwife

## 2015-11-19 DIAGNOSIS — M549 Dorsalgia, unspecified: Secondary | ICD-10-CM

## 2015-11-19 DIAGNOSIS — R102 Pelvic and perineal pain: Secondary | ICD-10-CM

## 2015-11-19 NOTE — Telephone Encounter (Signed)
Order for pelvic ultrasound entered. Encounter closed.  CC: Dr Talbert Nan

## 2015-11-19 NOTE — Telephone Encounter (Signed)
Return call to patient. States she has been having problems with back pain and has had multiple tests done without identified cause.  MRI, CT and colonoscopy all negative. Was told today to see gynecologist to rule out any pelvic cause. Patient then identified that pain is often in right lower pelvic area. Viewed CT report in EPIC,  Copied from report: Reproductive: No mass or other significant abnormality. Patient last had annual with Debbi 08-19-15. Advised will review with provider. Tentative plan for PUS and MD consult on Tueaday 11-23-15 with Dr Talbert Nan. Instructed to call back if pain increases before appointment. Also advised provider will review call and will call her back if any additional recommendations.  Agree with plan above?

## 2015-11-19 NOTE — Telephone Encounter (Signed)
Agree with plan 

## 2015-11-19 NOTE — Telephone Encounter (Signed)
Patient states she is having pelvic pain on the right side that comes and goes.

## 2015-11-23 ENCOUNTER — Ambulatory Visit (INDEPENDENT_AMBULATORY_CARE_PROVIDER_SITE_OTHER): Payer: BLUE CROSS/BLUE SHIELD | Admitting: Obstetrics and Gynecology

## 2015-11-23 ENCOUNTER — Encounter: Payer: Self-pay | Admitting: Obstetrics and Gynecology

## 2015-11-23 ENCOUNTER — Ambulatory Visit (INDEPENDENT_AMBULATORY_CARE_PROVIDER_SITE_OTHER): Payer: BLUE CROSS/BLUE SHIELD

## 2015-11-23 VITALS — BP 120/80 | HR 76 | Resp 15 | Wt 191.0 lb

## 2015-11-23 DIAGNOSIS — R102 Pelvic and perineal pain: Secondary | ICD-10-CM

## 2015-11-23 DIAGNOSIS — M549 Dorsalgia, unspecified: Secondary | ICD-10-CM

## 2015-11-23 DIAGNOSIS — M545 Low back pain, unspecified: Secondary | ICD-10-CM

## 2015-11-23 DIAGNOSIS — R232 Flushing: Secondary | ICD-10-CM

## 2015-11-23 DIAGNOSIS — N951 Menopausal and female climacteric states: Secondary | ICD-10-CM | POA: Diagnosis not present

## 2015-11-23 MED ORDER — GABAPENTIN 100 MG PO CAPS: ORAL_CAPSULE | ORAL | Status: DC

## 2015-11-23 NOTE — Progress Notes (Signed)
GYNECOLOGY  VISIT   HPI: 55 y.o.   Married  Caucasian  female   G2P2002 with Patient's last menstrual period was 07/29/2012.   here for RLQ pain and severe back pain for the last 6 week. The pain starts in her right lower back, radiates to her right lower abdomen. Can also ease across to her left lower back. Lying down helps. Standing is the most painful. No fevers, chills, nausea, emesis, diarrhea, constipation or urinary c/o. She has seen 3 back doctor's, she has been in PT. She has had a CT scan a colonoscopy, MRI (no etiology of pain found). Imaging shows mild arthritis and degeneration. She has had steroid shot, made it worse. PT not helping, had needling, not helping. She has been on an opoid, no help with her pain, taking a muscle relaxant.  She is unable to function, can't stand, can't work, wants to go back to work.  She is sexually active, no pain with coconut oil. Only sexually active 1 x in 6 week, hurt her back so she stopped.  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 Already on zoloft.    GYNECOLOGIC HISTORY: Patient's last menstrual period was 07/29/2012. Contraception:postmenopause Menopausal hormone therapy: none         OB History    Gravida Para Term Preterm AB TAB SAB Ectopic Multiple Living   2 2 2       2          Patient Active Problem List   Diagnosis Date Noted  . Chronic venous insufficiency 10/28/2013  . Varicose veins of lower extremities with other complications [id number]  . 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 PERSISTENT DISORDER INITIATING/MAINTAINING SLEEP 10/06/2009  . INADEQUATE SLEEP HYGIENE 10/06/2009  . DEPRESSION 10/05/2009  . VARICOSE VEINS, LOWER EXTREMITIES 10/05/2009  . ALLERGIC RHINITIS 10/05/2009  . 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 MIGRAINES, HX OF 10/05/2009    Past Medical History  Diagnosis Date  . Headache(784.0)   . Anxiety   . Depression   . PONV (postoperative nausea  and vomiting)   . Cystitis, interstitial     HAD BLADDER SX FOR SAME  . IBS (irritable bowel syndrome)     TAKES PROBIOTICS  . Restless leg syndrome   . Migraine   . Leg pain   . Varicose veins   . Fainting spell   . Fibromyalgia   . MVP (mitral valve prolapse)   . Ulcerative colitis [facility])     Past Surgical History  Procedure Laterality Date  . Vascular surgery      VARICOSE VEINS  . Nasal septum surgery    . Lasik    . Bladder surgery      FOR IC  . Breast reduction surgery  10/10/2011    Procedure: MAMMARY REDUCTION  (BREAST);  Surgeon: Charlene Brooke, MD;  Location: [place];  Service: Plastics;  Laterality: Bilateral;  . Breast biopsy Left 02-26-12    negative  . Abdominoplasty  4/14    Current Outpatient Prescriptions  Medication Sig Dispense Refill  . baclofen (LIORESAL) 10 MG tablet   0  . CALCIUM PO Take by mouth daily.    . Cetirizine HCl (ZYRTEC ALLERGY PO) Take 10 mg by mouth daily.    . chlorproMAZINE (THORAZINE) 25 MG tablet Take 1 tablet by mouth as needed.    . clonazePAM (KLONOPIN) 1 MG tablet Take 1 mg by mouth at bedtime.  0  . lamoTRIgine (LAMICTAL) 100 MG tablet Take 100 mg by mouth daily.  0  . MAXALT-MLT 10 MG disintegrating tablet as needed.   0  . Multiple Vitamins-Minerals (CENTRUM SILVER PO) Take 1 tablet by mouth daily.    Gean Birchwood ER 100 MG TB12   0  . Probiotic Product (PROBIOTIC PO) Take by mouth daily.    . sertraline (ZOLOFT) 25 MG tablet Take by mouth daily.   0  . SUMAtriptan 6 MG/0.5ML SOAJ   0  . tiZANidine (ZANAFLEX) 4 MG tablet Take 1 tablet by mouth as needed.    . traZODone (DESYREL) 100 MG tablet Take 100 mg by mouth at bedtime.     No current facility-administered medications for this visit.     ALLERGIES: Nsaids  Family History  Problem Relation Age of Onset  . Osteoporosis Mother   . Atrial fibrillation Mother   . Diabetes Father   . Hypertension Father   . Breast cancer Maternal Grandmother     . Cancer Maternal Grandmother     ovarian  . Diabetes Paternal Grandmother   . Migraines Brother     CHRONIC    Social History   Social History  . Marital Status: Married    Spouse Name: N/A  . Number of Children: N/A  . Years of Education: N/A   Occupational History  . Not on file.   Social History Main Topics  . Smoking status: Never Smoker   . Smokeless tobacco: Never Used  . Alcohol Use: 0.6 oz/week    1 Standard drinks or equivalent per week  . Drug Use: No  . Sexual Activity:    Partners: Male     Comment: husband vasectomy   Other Topics Concern  . Not on file   Social History Narrative    Review of Systems  Constitutional: Negative.   HENT: Negative.   Eyes: Negative.   Respiratory: Negative.   Cardiovascular: Negative.   Gastrointestinal: Negative.   Genitourinary: Negative.   Musculoskeletal: Negative.   Skin: Negative.   Neurological: Negative.   Endo/Heme/Allergies: Negative.   Psychiatric/Behavioral: Negative.     PHYSICAL EXAMINATION:    BP 120/80 mmHg  Pulse 76  Resp 15  Wt 191 lb (86.637 kg)  LMP 07/29/2012    General appearance: alert, cooperative and appears stated age Abdomen: soft, mildly tender in the RLQ, no rebound, no guarding, less tender with palpation over tensed muscles; bowel sounds normal; no masses,  no organomegaly  Pelvic: External genitalia:  no lesions              Urethra:  normal appearing urethra with no masses, tenderness or lesions              Bartholins and Skenes: normal                 Vagina: normal appearing vagina with normal color and discharge, no lesions              Cervix: no lesions              Bimanual Exam:  Uterus:  normal size, contour, position, consistency, mobility, non-tender              Adnexa: minimally tender on the right, no masses Pelvic floor: not tender               Chaperone was present for exam.  Ultrasound was normal, images reviewed with the patient  ASSESSMENT Chronic  pain pain, C/W MS etiology. Not helped with opoid or muscle relaxant.  Negative GYN ultrasound and exam Minimally tender in the RLQ, no findings Hot flashes    PLAN Discussed stopping the opoids (not helping) Discussed gabapentin for her pain and hot flashes, she would like to try She is f/u with her pain MD tomorrow, may continue with gabapentin there F/u as needed We also discussed herbal products and behavioral changes to treat vasomotor symptosm    An After Visit Summary was printed and given to the patient.  25 minutes face to face time of which over 50% was spent in counseling.   CC: Dr Eula Listen at [facility]

## 2015-11-23 NOTE — Patient Instructions (Signed)
Consider a therapeutic massage at [facility]

## 2015-12-02 ENCOUNTER — Other Ambulatory Visit: Payer: Self-pay | Admitting: Certified Nurse Midwife

## 2015-12-02 DIAGNOSIS — Z1231 Encounter for screening mammogram for malignant neoplasm of breast: Secondary | ICD-10-CM

## 2015-12-28 ENCOUNTER — Ambulatory Visit
Admission: RE | Admit: 2015-12-28 | Discharge: 2015-12-28 | Disposition: A | Discharge status: Home / Self Care | Payer: BLUE CROSS/BLUE SHIELD | Source: Ambulatory Visit | Attending: Certified Nurse Midwife | Admitting: Certified Nurse Midwife

## 2015-12-28 DIAGNOSIS — Z1231 Encounter for screening mammogram for malignant neoplasm of breast: Secondary | ICD-10-CM

## 2015-12-29 ENCOUNTER — Other Ambulatory Visit: Payer: Self-pay | Admitting: Certified Nurse Midwife

## 2015-12-29 DIAGNOSIS — R928 Other abnormal and inconclusive findings on diagnostic imaging of breast: Secondary | ICD-10-CM

## 2016-01-04 ENCOUNTER — Ambulatory Visit
Admission: RE | Admit: 2016-01-04 | Discharge: 2016-01-04 | Disposition: A | Discharge status: Home / Self Care | Payer: BLUE CROSS/BLUE SHIELD | Source: Ambulatory Visit | Attending: Certified Nurse Midwife | Admitting: Certified Nurse Midwife

## 2016-01-04 ENCOUNTER — Other Ambulatory Visit: Payer: Self-pay | Admitting: Certified Nurse Midwife

## 2016-01-04 DIAGNOSIS — R928 Other abnormal and inconclusive findings on diagnostic imaging of breast: Secondary | ICD-10-CM

## 2016-01-05 ENCOUNTER — Ambulatory Visit
Admission: RE | Admit: 2016-01-05 | Discharge: 2016-01-05 | Disposition: A | Discharge status: Home / Self Care | Payer: BLUE CROSS/BLUE SHIELD | Source: Ambulatory Visit | Attending: Certified Nurse Midwife | Admitting: Certified Nurse Midwife

## 2016-01-05 DIAGNOSIS — R928 Other abnormal and inconclusive findings on diagnostic imaging of breast: Secondary | ICD-10-CM

## 2016-03-22 ENCOUNTER — Other Ambulatory Visit: Payer: Self-pay | Admitting: Family Medicine

## 2016-03-22 ENCOUNTER — Ambulatory Visit
Admission: RE | Admit: 2016-03-22 | Discharge: 2016-03-22 | Disposition: A | Discharge status: Home / Self Care | Payer: BLUE CROSS/BLUE SHIELD | Source: Ambulatory Visit | Attending: Family Medicine | Admitting: Family Medicine

## 2016-03-22 DIAGNOSIS — M545 Low back pain, unspecified: Secondary | ICD-10-CM

## 2016-08-23 ENCOUNTER — Encounter: Payer: Self-pay | Admitting: Certified Nurse Midwife

## 2016-08-23 ENCOUNTER — Ambulatory Visit: Payer: BLUE CROSS/BLUE SHIELD | Admitting: Certified Nurse Midwife

## 2016-08-23 VITALS — BP 110/64 | HR 68 | Resp 16 | Ht 65.25 in | Wt 190.0 lb

## 2016-08-23 DIAGNOSIS — Z0000 Encounter for general adult medical examination without abnormal findings: Secondary | ICD-10-CM | POA: Diagnosis not present

## 2016-08-23 DIAGNOSIS — R319 Hematuria, unspecified: Secondary | ICD-10-CM | POA: Diagnosis not present

## 2016-08-23 DIAGNOSIS — L298 Other pruritus: Secondary | ICD-10-CM

## 2016-08-23 DIAGNOSIS — Z01419 Encounter for gynecological examination (general) (routine) without abnormal findings: Secondary | ICD-10-CM

## 2016-08-23 DIAGNOSIS — Z124 Encounter for screening for malignant neoplasm of cervix: Secondary | ICD-10-CM | POA: Diagnosis not present

## 2016-08-23 DIAGNOSIS — N898 Other specified noninflammatory disorders of vagina: Secondary | ICD-10-CM

## 2016-08-23 LAB — POCT URINALYSIS DIPSTICK
Bilirubin, UA: NEGATIVE
Glucose, UA: NEGATIVE
Ketones, UA: NEGATIVE
Leukocytes, UA: NEGATIVE — AB
Nitrite, UA: NEGATIVE
Protein, UA: NEGATIVE
Urobilinogen, UA: NEGATIVE E.U./dL — AB
pH, UA: 5 (ref 5.0–8.0)

## 2016-08-23 NOTE — Progress Notes (Signed)
56 y.o. G21P2002 Married  Caucasian Fe here for annual exam. Menopausal no HRT. Had concussion in 12/17 walking outside and fell on sidewalk. Has had to quit work due to headaches and now on study medication which has helped,, waiting on FDA approval in summer for continuation of. Continues with [facility] for back pain and stays at home because of headache pain. Good family and friends  who visit and support her. Still trying to care for mother who is 65 and needs assisted care.  Sees Neurology for headache and concussion management. Sees Dr Justin Mend for aex/labs and medication management. Denies vaginal bleeding, some vaginal itching off and on. Please check. Denies new personal products, uses Coconut oil for dryness with good result. Not really sexually active due to medication and current health status. No other health issues today. Planning for better days to come!  Patient's last menstrual period was 03/10/2013.          Sexually active: Yes.    The current method of family planning is vasectomy.    Exercising: No.  exercise Smoker:  no  Health Maintenance: Pap:  08-06-14 neg HPV HR neg MMG:  8/17 mammo & biopsy left breast (fat necrosis with calcifications & fibrocystic changes Colonoscopy:  2013 BMD:   none TDaP:  2009 Shingles: 2013 Pneumonia: no Hep C and HIV: both neg 2017 Labs: poct urine-rbc 1+ Self breast exam: done monthly   reports that she has never smoked. She has never used smokeless tobacco. She reports that she does not drink alcohol or use drugs.  Past Medical History:  Diagnosis Date  . Anxiety   . Concussion 03-17-17  . Cystitis, interstitial    HAD BLADDER SX FOR SAME  . Depression   . Fainting spell   . Fibromyalgia   . Headache(784.0)   . IBS (irritable bowel syndrome)    TAKES PROBIOTICS  . Leg pain   . Migraine   . MVP (mitral valve prolapse)   . PONV (postoperative nausea and vomiting)   . Restless leg syndrome   . Ulcerative colitis ([place])   . Varicose veins      Past Surgical History:  Procedure Laterality Date  . ABDOMINOPLASTY  4/14  . BLADDER SURGERY     FOR IC  . BREAST BIOPSY Left 02-26-12   negative  . BREAST REDUCTION SURGERY  10/10/2011   Procedure: MAMMARY REDUCTION  (BREAST);  Surgeon: Charlene Brooke, MD;  Location: [place];  Service: Plastics;  Laterality: Bilateral;  . LASIK    . NASAL SEPTUM SURGERY    . VASCULAR SURGERY     VARICOSE VEINS    Current Outpatient Prescriptions  Medication Sig Dispense Refill  . Calcium Citrate (CITRACAL PO) Take by mouth.    [name] CALCIUM PO Take by mouth daily.    . Cetirizine HCl (ZYRTEC ALLERGY PO) Take 10 mg by mouth daily.    . chlorproMAZINE (THORAZINE) 25 MG tablet Take 1 tablet by mouth as needed.    . clonazePAM (KLONOPIN) 1 MG tablet Take 1 mg by mouth at bedtime.  0  . ELMIRON 100 MG capsule TAKE 1 CAPSULE BY MOUTH 3 TIMES DAILY ON EMPTY stomach AS NEEDED FOR BLADDER PAIN  11  . Ketorolac Tromethamine (TORADOL IJ) Inject as directed as needed.    . lamoTRIgine (LAMICTAL) 100 MG tablet Take 100 mg by mouth daily.  0  . Multiple Vitamins-Minerals (CENTRUM SILVER PO) Take 1 tablet by mouth daily.    [name]  Probiotic Product (PROBIOTIC PO) Take by mouth daily.    . sertraline (ZOLOFT) 25 MG tablet Take by mouth daily.   0  . tiZANidine (ZANAFLEX) 4 MG tablet Take 1 tablet by mouth as needed.    . topiramate (TOPAMAX) 25 MG tablet Take 75 mg by mouth 2 (two) times daily.    . traZODone (DESYREL) 100 MG tablet Take 100 mg by mouth at bedtime.    . Wheat Dextrin (BENEFIBER PO) Take by mouth.     No current facility-administered medications for this visit.     Family History  Problem Relation Age of Onset  . Osteoporosis Mother   . Atrial fibrillation Mother   . Diabetes Father   . Hypertension Father   . Breast cancer Maternal Grandmother   . Cancer Maternal Grandmother     ovarian  . Diabetes Paternal Grandmother   . Migraines Brother     CHRONIC    ROS:   Pertinent items are noted in HPI.  Otherwise, a comprehensive ROS was negative.  Exam:   BP 110/64   Pulse 68   Resp 16   Ht 5' 5.25" (1.657 m)   Wt 190 lb (86.2 kg)   LMP 03/10/2013   BMI 31.38 kg/m  Height: 5' 5.25" (165.7 cm) Ht Readings from Last 3 Encounters:  08/23/16 5' 5.25" (1.657 m)  08/19/15 5\' 5"  (1.651 m)  09/22/14 5\' 5"  (1.651 m)    General appearance: alert, cooperative and appears stated age Head: Normocephalic, without obvious abnormality, atraumatic Neck: no adenopathy, supple, symmetrical, trachea midline and thyroid normal to inspection and palpation Lungs: clear to auscultation bilaterally Breasts: normal appearance, no masses or tenderness, No nipple retraction or dimpling, No nipple discharge or bleeding, No axillary or supraclavicular adenopathy Heart: regular rate and rhythm Abdomen: soft, non-tender; no masses,  no organomegaly Extremities: extremities normal, atraumatic, no cyanosis or edema Skin: Skin color, texture, turgor normal. No rashes or lesions Lymph nodes: Cervical, supraclavicular, and axillary nodes normal. No abnormal inguinal nodes palpated Neurologic: Grossly normal   Pelvic: External genitalia:  no lesions              Urethra:  normal appearing urethra with no masses, tenderness or lesions              Bartholin's and Skene's: normal                 Vagina: normal appearing vagina with normal color and discharge, no lesions              Cervix: no cervical motion tenderness, no lesions and normal appearance              Pap taken: Yes.   Bimanual Exam:  Uterus:  normal size, contour, position, consistency, mobility, non-tender              Adnexa: normal adnexa and no mass, fullness, tenderness               Rectovaginal: Confirms               Anus:  normal sphincter tone, no lesions  Chaperone present: yes  A:  Well Woman with normal exam  Menopausal no HRT  Vaginal  Itching r/o vaginal infection  Asymptomatic hematuria, r/o  infection  Vaginal dryness coconut oil working well  Chronic history of headaches and now concussion sequelae with neurology management.  Chronic back pain with [facility] management.  Family history of breast/ovarian cancer MGM, late  age  P:   Reviewed health and wellness pertinent to exam  Aware of need to advise if vaginal bleeding  Will treat per affirm if indicated.   Lab: Affirm  Warning signs of UTI and hematuria given and need to advise.  Lab: Urine micro  Continue follow with MD as indicated.  Pap smear as above   counseled on breast self exam, mammography screening, and breast cancer prevention, adequate intake of calcium and vitamin D, diet and exercise.   return annually or prn  An After Visit Summary was printed and given to the patient.

## 2016-08-23 NOTE — Patient Instructions (Signed)
EXERCISE AND DIET:  We recommended that you start or continue a regular exercise program for good health. Regular exercise means any activity that makes your heart beat faster and makes you sweat.  We recommend exercising at least 30 minutes per day at least 3 days a week, preferably 4 or 5.  We also recommend a diet low in fat and sugar.  Inactivity, poor dietary choices and obesity can cause diabetes, heart attack, stroke, and kidney damage, among others.    ALCOHOL AND SMOKING:  Women should limit their alcohol intake to no more than 7 drinks/beers/glasses of wine (combined, not each!) per week. Moderation of alcohol intake to this level decreases your risk of breast cancer and liver damage. And of course, no recreational drugs are part of a healthy lifestyle.  And absolutely no smoking or even second hand smoke. Most people know smoking can cause heart and lung diseases, but did you know it also contributes to weakening of your bones? Aging of your skin?  Yellowing of your teeth and nails?  CALCIUM AND VITAMIN D:  Adequate intake of calcium and Vitamin D are recommended.  The recommendations for exact amounts of these supplements seem to change often, but generally speaking 600 mg of calcium (either carbonate or citrate) and 800 units of Vitamin D per day seems prudent. Certain women may benefit from higher intake of Vitamin D.  If you are among these women, your doctor will have told you during your visit.    PAP SMEARS:  Pap smears, to check for cervical cancer or precancers,  have traditionally been done yearly, although recent scientific advances have shown that most women can have pap smears less often.  However, every woman still should have a physical exam from her gynecologist every year. It will include a breast check, inspection of the vulva and vagina to check for abnormal growths or skin changes, a visual exam of the cervix, and then an exam to evaluate the size and shape of the uterus and  ovaries.  And after 58 years of age, a rectal exam is indicated to check for rectal cancers. We will also provide age appropriate advice regarding health maintenance, like when you should have certain vaccines, screening for sexually transmitted diseases, bone density testing, colonoscopy, mammograms, etc.   MAMMOGRAMS:  All women over 40 years old should have a yearly mammogram. Many facilities now offer a "3D" mammogram, which may cost around $50 extra out of pocket. If possible,  we recommend you accept the option to have the 3D mammogram performed.  It both reduces the number of women who will be called back for extra views which then turn out to be normal, and it is better than the routine mammogram at detecting truly abnormal areas.    COLONOSCOPY:  Colonoscopy to screen for colon cancer is recommended for all women at age 50.  We know, you hate the idea of the prep.  We agree, BUT, having colon cancer and not knowing it is worse!!  Colon cancer so often starts as a polyp that can be seen and removed at colonscopy, which can quite literally save your life!  And if your first colonoscopy is normal and you have no family history of colon cancer, most women don't have to have it again for 10 years.  Once every ten years, you can do something that may end up saving your life, right?  We will be happy to help you get it scheduled when you are ready.    Be sure to check your insurance coverage so you understand how much it will cost.  It may be covered as a preventative service at no cost, but you should check your particular policy.      Fatigue Fatigue is feeling tired all of the time, a lack of energy, or a lack of motivation. Occasional or mild fatigue is often a normal response to activity or life in general. However, long-lasting (chronic) or extreme fatigue may indicate an underlying medical condition. Follow these instructions at home: Watch your fatigue for any changes. The following actions may  help to lessen any discomfort you are feeling:  Talk to your health care provider about how much sleep you need each night. Try to get the required amount every night.  Take medicines only as directed by your health care provider.  Eat a healthy and nutritious diet. Ask your health care provider if you need help changing your diet.  Drink enough fluid to keep your urine clear or pale yellow.  Practice ways of relaxing, such as yoga, meditation, massage therapy, or acupuncture.  Exercise regularly.  Change situations that cause you stress. Try to keep your work and personal routine reasonable.  Do not abuse illegal drugs.  Limit alcohol intake to no more than 1 drink per day for nonpregnant women and 2 drinks per day for men. One drink equals 12 ounces of beer, 5 ounces of wine, or 1 ounces of hard liquor.  Take a multivitamin, if directed by your health care provider. Contact a health care provider if:  Your fatigue does not get better.  You have a fever.  You have unintentional weight loss or gain.  You have headaches.  You have difficulty:  Falling asleep.  Sleeping throughout the night.  You feel angry, guilty, anxious, or sad.  You are unable to have a bowel movement (constipation).  You skin is dry.  Your legs or another part of your body is swollen. Get help right away if:  You feel confused.  Your vision is blurry.  You feel faint or pass out.  You have a severe headache.  You have severe abdominal, pelvic, or back pain.  You have chest pain, shortness of breath, or an irregular or fast heartbeat.  You are unable to urinate or you urinate less than normal.  You develop abnormal bleeding, such as bleeding from the rectum, vagina, nose, lungs, or nipples.  You vomit blood.  You have thoughts about harming yourself or committing suicide.  You are worried that you might harm someone else. This information is not intended to replace advice given  to you by your health care provider. Make sure you discuss any questions you have with your health care provider. Document Released: 02/26/2007 Document Revised: 10/07/2015 Document Reviewed: 09/02/2013 Elsevier Interactive Patient Education  2017 [employer].

## 2016-08-24 LAB — WET PREP BY MOLECULAR PROBE
Candida species: NOT DETECTED
Gardnerella vaginalis: NOT DETECTED
Trichomonas vaginosis: NOT DETECTED

## 2016-08-24 LAB — URINALYSIS, MICROSCOPIC ONLY
Bacteria, UA: NONE SEEN [HPF]
Casts: NONE SEEN [LPF]
Crystals: NONE SEEN [HPF]
Yeast: NONE SEEN [HPF]

## 2016-08-25 ENCOUNTER — Telehealth: Payer: Self-pay | Admitting: *Deleted

## 2016-08-25 DIAGNOSIS — R8279 Other abnormal findings on microbiological examination of urine: Secondary | ICD-10-CM

## 2016-08-25 LAB — IPS PAP TEST WITH REFLEX TO HPV

## 2016-08-25 NOTE — Telephone Encounter (Signed)
Left message to call Sharee Pimple at [phone].   Order placed for referral to urology.

## 2016-08-25 NOTE — Telephone Encounter (Signed)
Left message to call Sharee Pimple at [phone].     Notes recorded by Regina Eck, CNM on 08/25/2016 at 7:53 AM EDT Notify patient that urine micro showed 3-10 RBC's and needs to be evaluated by urology, due to none present with last micro. Please schedule for urology visit if she has seen no one refer to [facility]

## 2016-08-25 NOTE — Telephone Encounter (Signed)
I am not aware of any in [place] area.

## 2016-08-25 NOTE — Telephone Encounter (Signed)
Spoke with patient, advised of results and recommendations as seen below per Melvia Heaps, CNM. Patient states she would prefer female urologist if Melvia Heaps, CNM has a recommendation. Patient states if not an option will see Dr. Matilde Sprang at [facility]. Advised patient would review with Melvia Heaps, CNM and return call. Patient verbalizes understanding and is agreeable.  Melvia Heaps, CNM -any recommendations for female urologist?

## 2016-08-25 NOTE — Telephone Encounter (Signed)
-----   Message from Regina Eck, CNM sent at 08/25/2016  7:54 AM EDT ----- Also notify that wet prep was negative for yeast, BV and trichomonas See previous note

## 2016-08-28 NOTE — Telephone Encounter (Signed)
Left detailed message, ok per current dpr. Advised patient Marisa Hamilton, CNM not aware of female urologist in [place], referral placed to [facility] -Dr. Matilde Sprang. Our referral department will be following up for scheduling. If you have any additional questions, return call to office at [phone].  Routing to provider for final review. Patient is agreeable to disposition. Will close encounter.  Cc: [name]

## 2016-09-02 NOTE — Progress Notes (Signed)
Encounter reviewed Karen Huhta, MD   

## 2016-09-22 ENCOUNTER — Telehealth: Payer: Self-pay | Admitting: Certified Nurse Midwife

## 2016-09-22 NOTE — Telephone Encounter (Signed)
Left voicemail regarding referral appointment. The information is listed below. Should the patient need to cancel or reschedule this appointment, please advise them to call the office they've been referred to in order to reschedule.  [facility] [address] [place]  Phone: [phone]  Jimmey Ralph, NP  09-26-16 @ 9am. Please arrive 15 minutes early and bring your insurance card, photo id and list of medications.

## 2016-10-18 ENCOUNTER — Encounter (HOSPITAL_COMMUNITY): Payer: Self-pay | Admitting: Emergency Medicine

## 2016-10-18 ENCOUNTER — Ambulatory Visit (HOSPITAL_COMMUNITY)
Admission: EM | Admit: 2016-10-18 | Discharge: 2016-10-18 | Disposition: A | Discharge status: Home / Self Care | Payer: BLUE CROSS/BLUE SHIELD | Attending: Internal Medicine | Admitting: Internal Medicine

## 2016-10-18 DIAGNOSIS — R112 Nausea with vomiting, unspecified: Secondary | ICD-10-CM

## 2016-10-18 DIAGNOSIS — R197 Diarrhea, unspecified: Secondary | ICD-10-CM

## 2016-10-18 DIAGNOSIS — A084 Viral intestinal infection, unspecified: Secondary | ICD-10-CM | POA: Diagnosis not present

## 2016-10-18 LAB — POCT I-STAT, CHEM 8
BUN: 21 mg/dL — ABNORMAL HIGH (ref 6–20)
Calcium, Ion: 1.01 mmol/L — ABNORMAL LOW (ref 1.15–1.40)
Chloride: 109 mmol/L (ref 101–111)
Creatinine, Ser: 0.9 mg/dL (ref 0.44–1.00)
Glucose, Bld: 103 mg/dL — ABNORMAL HIGH (ref 65–99)
HCT: 42 % (ref 36.0–46.0)
Hemoglobin: 14.3 g/dL (ref 12.0–15.0)
Potassium: 7.1 mmol/L (ref 3.5–5.1)
Sodium: 134 mmol/L — ABNORMAL LOW (ref 135–145)
TCO2: 23 mmol/L (ref 0–100)

## 2016-10-18 MED ORDER — LORAZEPAM 2 MG/ML IJ SOLN
INTRAMUSCULAR | Status: AC
  Filled 2016-10-18: qty 1

## 2016-10-18 MED ORDER — KETOROLAC TROMETHAMINE 30 MG/ML IJ SOLN
INTRAMUSCULAR | Status: AC
  Filled 2016-10-18: qty 1

## 2016-10-18 MED ORDER — ONDANSETRON HCL 4 MG/2ML IJ SOLN
INTRAMUSCULAR | Status: AC
  Filled 2016-10-18: qty 2

## 2016-10-18 MED ORDER — ACETAMINOPHEN 325 MG PO TABS
ORAL_TABLET | ORAL | Status: AC
  Filled 2016-10-18: qty 2

## 2016-10-18 MED ORDER — ACETAMINOPHEN 325 MG PO TABS
650.0000 mg | ORAL_TABLET | Freq: Once | ORAL | Status: AC
  Administered 2016-10-18: 650 mg via ORAL

## 2016-10-18 MED ORDER — KETOROLAC TROMETHAMINE 30 MG/ML IJ SOLN
15.0000 mg | Freq: Once | INTRAMUSCULAR | Status: AC
  Administered 2016-10-18: 15 mg via INTRAVENOUS

## 2016-10-18 MED ORDER — LORAZEPAM 2 MG/ML IJ SOLN
1.0000 mg | Freq: Once | INTRAMUSCULAR | Status: AC
  Administered 2016-10-18: 1 mg via INTRAVENOUS

## 2016-10-18 MED ORDER — ONDANSETRON 4 MG PO TBDP: 4.0000 mg | ORAL_TABLET | Freq: Three times a day (TID) | ORAL | 0 refills | Status: DC | PRN

## 2016-10-18 MED ORDER — ONDANSETRON HCL 4 MG/2ML IJ SOLN
4.0000 mg | Freq: Once | INTRAMUSCULAR | Status: AC
  Administered 2016-10-18: 4 mg via INTRAVENOUS

## 2016-10-18 MED ORDER — SODIUM CHLORIDE 0.9 % IV BOLUS (SEPSIS)
1000.0000 mL | Freq: Once | INTRAVENOUS | Status: AC
  Administered 2016-10-18: 1000 mL via INTRAVENOUS

## 2016-10-18 NOTE — ED Provider Notes (Signed)
CSN: [id number]     Arrival date & time 10/18/16  1607 History   First MD Initiated Contact with Patient 10/18/16 1733     Chief Complaint  Patient presents with  . URI   (Consider location/radiation/quality/duration/timing/severity/associated sxs/prior Treatment) 56 year old female states that early this morning upon awakening she was experiencing a headache similar to her typical migraine headaches. This was quickly followed by nausea vomiting and diarrhea. She says she has vomited approximately 4 times today and may be 6 episodes of diarrhea. No blood seen. She is having generalized aches and pains. Pain in the back of the neck and some dizziness. She states she had a fever at home but did not take any medications. She has ulcerative colitis and cannot take oral NSAIDs.      Past Medical History:  Diagnosis Date  . Anxiety   . Concussion 03-17-17  . Cystitis, interstitial    HAD BLADDER SX FOR SAME  . Depression   . Fainting spell   . Fibromyalgia   . Headache(784.0)   . IBS (irritable bowel syndrome)    TAKES PROBIOTICS  . Leg pain   . Migraine   . MVP (mitral valve prolapse)   . PONV (postoperative nausea and vomiting)   . Restless leg syndrome   . Ulcerative colitis ([place])   . Varicose veins    Past Surgical History:  Procedure Laterality Date  . ABDOMINOPLASTY  4/14  . BLADDER SURGERY     FOR IC  . BREAST BIOPSY Left 02-26-12   negative  . BREAST REDUCTION SURGERY  10/10/2011   Procedure: MAMMARY REDUCTION  (BREAST);  Surgeon: Charlene Brooke, MD;  Location: [facility];  Service: Plastics;  Laterality: Bilateral;  . LASIK    . NASAL SEPTUM SURGERY    . VASCULAR SURGERY     VARICOSE VEINS   Family History  Problem Relation Age of Onset  . Osteoporosis Mother   . Atrial fibrillation Mother   . Diabetes Father   . Hypertension Father   . Breast cancer Maternal Grandmother   . Cancer Maternal Grandmother        ovarian  . Diabetes Paternal  Grandmother   . Migraines Brother        CHRONIC   Social History  Substance Use Topics  . Smoking status: Never Smoker  . Smokeless tobacco: Never Used  . Alcohol use No   OB History    Gravida Para Term Preterm AB Living   2 2 2     2    SAB TAB Ectopic Multiple Live Births           2     Review of Systems  Constitutional: Positive for activity change, chills, fatigue and fever.  HENT: Positive for congestion and postnasal drip.   Respiratory: Negative for cough and shortness of breath.   Cardiovascular: Negative for chest pain.  Gastrointestinal: Positive for abdominal pain, diarrhea, nausea and vomiting.  Genitourinary: Negative.   Musculoskeletal: Positive for myalgias.  Neurological: Positive for dizziness and headaches.  All other systems reviewed and are negative.   Allergies  Nsaids  Home Medications   Prior to Admission medications   Medication Sig Start Date End Date Taking? Authorizing Provider  Calcium Citrate (CITRACAL PO) Take by mouth.    [provider]  CALCIUM PO Take by mouth daily.    [provider]  Cetirizine HCl (ZYRTEC ALLERGY PO) Take 10 mg by mouth daily.    [provider]  chlorproMAZINE (THORAZINE) 25 MG tablet Take 1 tablet by mouth as needed. 04/04/13   [provider]  clonazePAM (KLONOPIN) 1 MG tablet Take 1 mg by mouth at bedtime. 09/09/14   [provider]  ELMIRON 100 MG capsule TAKE 1 CAPSULE BY MOUTH 3 TIMES DAILY ON EMPTY stomach AS NEEDED FOR BLADDER PAIN 07/31/16   [provider]  Ketorolac Tromethamine (TORADOL IJ) Inject as directed as needed.    [provider]  lamoTRIgine (LAMICTAL) 100 MG tablet Take 100 mg by mouth daily. 06/15/14   [provider]  Multiple Vitamins-Minerals (CENTRUM SILVER PO) Take 1 tablet by mouth daily.    [provider]  ondansetron (ZOFRAN ODT) 4 MG disintegrating tablet Take 1 tablet (4 mg total) by mouth every 8 (eight)  hours as needed for nausea or vomiting. 10/18/16   Janne Napoleon, NP  Probiotic Product (PROBIOTIC PO) Take by mouth daily.    [provider]  sertraline (ZOLOFT) 25 MG tablet Take by mouth daily.  05/15/14   [provider]  tiZANidine (ZANAFLEX) 4 MG tablet Take 1 tablet by mouth as needed. 04/05/13   [provider]  topiramate (TOPAMAX) 25 MG tablet Take 75 mg by mouth 2 (two) times daily.    [provider]  traZODone (DESYREL) 100 MG tablet Take 100 mg by mouth at bedtime.    [provider]  Wheat Dextrin (BENEFIBER PO) Take by mouth.    [provider]   Meds Ordered and Administered this Visit   Medications  sodium chloride 0.9 % bolus 1,000 mL (1,000 mLs Intravenous New Bag/Given 10/18/16 1818)  ondansetron (ZOFRAN) injection 4 mg (4 mg Intravenous Given 10/18/16 1819)  ketorolac (TORADOL) 30 MG/ML injection 15 mg (15 mg Intravenous Given 10/18/16 1819)  LORazepam (ATIVAN) injection 1 mg (1 mg Intravenous Given 10/18/16 1819)  acetaminophen (TYLENOL) tablet 650 mg (650 mg Oral Given 10/18/16 1819)    BP 115/65 (BP Location: Left Arm)   Pulse (!) 103   Temp 100.3 F (37.9 C) (Oral)   Resp 20   LMP 03/10/2013   SpO2 97%  No data found.   Physical Exam  Constitutional: She is oriented to person, place, and time. She appears well-developed and well-nourished. No distress.  HENT:  Head: Normocephalic and atraumatic.  Nose: Nose normal.  Mouth/Throat: Oropharynx is clear and moist. No oropharyngeal exudate.  Eyes: EOM are normal.  Neck: Normal range of motion. Neck supple.  Cardiovascular: Regular rhythm, normal heart sounds and intact distal pulses.   Apical tachycardia  Pulmonary/Chest: Effort normal and breath sounds normal. No respiratory distress. She has no wheezes.  Abdominal: Soft. Bowel sounds are normal.  Generalized abdominal tenderness. No rebound or guarding.  Lymphadenopathy:    She has no cervical adenopathy.   Neurological: She is alert and oriented to person, place, and time.  Skin: Skin is warm and dry.  Psychiatric: She has a normal mood and affect.  Nursing note and vitals reviewed.   Urgent Care Course     Procedures (including critical care time)  Labs Review Labs Reviewed  POCT I-STAT, CHEM 8 - Abnormal; Notable for the following:       Result Value   Sodium 134 (*)    Potassium 7.1 (*)    BUN 21 (*)    Glucose, Bld 103 (*)    Calcium, Ion 1.01 (*)    All other components within normal limits    Imaging Review No results  found.   Visual Acuity Review  Right Eye Distance:   Left Eye Distance:   Bilateral Distance:    Right Eye Near:   Left Eye Near:    Bilateral Near:         MDM   1. Viral gastroenteritis   2. Nausea and vomiting in adult   3. Diarrhea of presumed infectious origin    Patient list NSAIDs as allergy. She is not actually allergic to the medication she has been advised not to take by mouth NSAIDs secondary to ulcerative colitis. She actually has injections of Toradol with her for headaches. She has not used it yet however it has been prescribed by other riders.  1935 hrs. the patient has been observed while receiving 1 L of normal saline IV. She has been checked on several times. Shortly after administration of the medications above she states she was feeling better and now she states she feels much much better and is ready to go home. Her symptoms are nearly abated and feels stronger. Her husband notes that she looks much better as well. Start sipping on the Pedialyte and other clear liquids. Start out with eating crackers, toast, applesauce in small amounts at first. As you are able to tolerate you may advance her diet. Take Zofran as directed every 6 hours if needed for nausea or vomiting. Tylenol every 4 hours for fever, aches or pains. Getting worse, new symptoms or problems may return or if needed go to the emergency department. Meds ordered  this encounter  Medications  . sodium chloride 0.9 % bolus 1,000 mL  . ondansetron (ZOFRAN) injection 4 mg  . ketorolac (TORADOL) 30 MG/ML injection 15 mg  . LORazepam (ATIVAN) injection 1 mg  . acetaminophen (TYLENOL) tablet 650 mg  . ondansetron (ZOFRAN ODT) 4 MG disintegrating tablet    Sig: Take 1 tablet (4 mg total) by mouth every 8 (eight) hours as needed for nausea or vomiting.    Dispense:  15 tablet    Refill:  0    Order Specific Question:   Supervising Provider    Answer:   Sherlene Shams [[id number]]       Janne Napoleon, NP 10/18/16 1944

## 2016-10-18 NOTE — Discharge Instructions (Signed)
Start sipping on the Pedialyte and other clear liquids. Start out with eating crackers, toast, applesauce in small amounts at first. As you are able to tolerate you may advance her diet. Take Zofran as directed every 6 hours if needed for nausea or vomiting. Tylenol every 4 hours for fever, aches or pains. Getting worse, new symptoms or problems may return or if needed go to the emergency department.

## 2016-10-18 NOTE — ED Triage Notes (Signed)
Onset this morning of nausea, vomiting and diarrhea, aching all over, and dizziness

## 2017-03-17 DIAGNOSIS — S060X9A Concussion with loss of consciousness of unspecified duration, initial encounter: Secondary | ICD-10-CM

## 2017-03-17 DIAGNOSIS — S060XAA Concussion with loss of consciousness status unknown, initial encounter: Secondary | ICD-10-CM

## 2017-03-17 HISTORY — DX: Concussion with loss of consciousness status unknown, initial encounter: S06.0XAA

## 2017-03-17 HISTORY — DX: Concussion with loss of consciousness of unspecified duration, initial encounter: S06.0X9A

## 2017-03-22 ENCOUNTER — Other Ambulatory Visit: Payer: Self-pay | Admitting: Gastroenterology

## 2017-03-22 DIAGNOSIS — R112 Nausea with vomiting, unspecified: Secondary | ICD-10-CM

## 2017-04-06 ENCOUNTER — Ambulatory Visit (HOSPITAL_COMMUNITY)
Admission: RE | Admit: 2017-04-06 | Discharge: 2017-04-06 | Disposition: A | Discharge status: Home / Self Care | Payer: BLUE CROSS/BLUE SHIELD | Source: Ambulatory Visit | Attending: Gastroenterology | Admitting: Gastroenterology

## 2017-04-06 ENCOUNTER — Encounter (HOSPITAL_COMMUNITY)
Admission: RE | Admit: 2017-04-06 | Discharge: 2017-04-06 | Disposition: A | Discharge status: Home / Self Care | Payer: BLUE CROSS/BLUE SHIELD | Source: Ambulatory Visit | Attending: Gastroenterology | Admitting: Gastroenterology

## 2017-04-06 DIAGNOSIS — K7689 Other specified diseases of liver: Secondary | ICD-10-CM | POA: Diagnosis not present

## 2017-04-06 DIAGNOSIS — R112 Nausea with vomiting, unspecified: Secondary | ICD-10-CM | POA: Diagnosis not present

## 2017-04-06 MED ORDER — TECHNETIUM TC 99M MEBROFENIN IV KIT
5.0000 | PACK | Freq: Once | INTRAVENOUS | Status: AC | PRN
  Administered 2017-04-06: 5 via INTRAVENOUS

## 2017-04-17 ENCOUNTER — Other Ambulatory Visit: Payer: Self-pay | Admitting: Gastroenterology

## 2017-04-17 DIAGNOSIS — R112 Nausea with vomiting, unspecified: Secondary | ICD-10-CM

## 2017-05-02 ENCOUNTER — Ambulatory Visit (HOSPITAL_COMMUNITY)
Admission: RE | Admit: 2017-05-02 | Discharge: 2017-05-02 | Disposition: A | Discharge status: Home / Self Care | Payer: BLUE CROSS/BLUE SHIELD | Source: Ambulatory Visit | Attending: Gastroenterology | Admitting: Gastroenterology

## 2017-05-02 DIAGNOSIS — R112 Nausea with vomiting, unspecified: Secondary | ICD-10-CM | POA: Insufficient documentation

## 2017-05-02 MED ORDER — TECHNETIUM TC 99M SULFUR COLLOID
1.2000 | Freq: Once | INTRAVENOUS | Status: AC | PRN
  Administered 2017-05-02: 1.2 via INTRAVENOUS

## 2017-08-21 ENCOUNTER — Other Ambulatory Visit: Payer: Self-pay | Admitting: Certified Nurse Midwife

## 2017-08-21 DIAGNOSIS — Z1231 Encounter for screening mammogram for malignant neoplasm of breast: Secondary | ICD-10-CM

## 2017-08-23 ENCOUNTER — Ambulatory Visit
Admission: RE | Admit: 2017-08-23 | Discharge: 2017-08-23 | Disposition: A | Discharge status: Home / Self Care | Payer: BLUE CROSS/BLUE SHIELD | Source: Ambulatory Visit | Attending: Certified Nurse Midwife | Admitting: Certified Nurse Midwife

## 2017-08-23 DIAGNOSIS — Z1231 Encounter for screening mammogram for malignant neoplasm of breast: Secondary | ICD-10-CM

## 2017-08-24 ENCOUNTER — Ambulatory Visit: Payer: BLUE CROSS/BLUE SHIELD | Admitting: Certified Nurse Midwife

## 2017-08-27 ENCOUNTER — Other Ambulatory Visit: Payer: Self-pay | Admitting: Certified Nurse Midwife

## 2017-08-27 DIAGNOSIS — R928 Other abnormal and inconclusive findings on diagnostic imaging of breast: Secondary | ICD-10-CM

## 2017-08-28 ENCOUNTER — Ambulatory Visit
Admission: RE | Admit: 2017-08-28 | Discharge: 2017-08-28 | Disposition: A | Discharge status: Home / Self Care | Payer: BLUE CROSS/BLUE SHIELD | Source: Ambulatory Visit | Attending: Certified Nurse Midwife | Admitting: Certified Nurse Midwife

## 2017-08-28 ENCOUNTER — Other Ambulatory Visit: Payer: Self-pay | Admitting: Certified Nurse Midwife

## 2017-08-28 DIAGNOSIS — R921 Mammographic calcification found on diagnostic imaging of breast: Secondary | ICD-10-CM

## 2017-08-28 DIAGNOSIS — R928 Other abnormal and inconclusive findings on diagnostic imaging of breast: Secondary | ICD-10-CM

## 2017-09-03 ENCOUNTER — Ambulatory Visit (HOSPITAL_COMMUNITY): Payer: BLUE CROSS/BLUE SHIELD

## 2017-09-03 ENCOUNTER — Ambulatory Visit
Admission: RE | Admit: 2017-09-03 | Discharge: 2017-09-03 | Disposition: A | Discharge status: Home / Self Care | Payer: BLUE CROSS/BLUE SHIELD | Source: Ambulatory Visit | Attending: Certified Nurse Midwife | Admitting: Certified Nurse Midwife

## 2017-09-03 ENCOUNTER — Other Ambulatory Visit: Payer: Self-pay | Admitting: Certified Nurse Midwife

## 2017-09-03 DIAGNOSIS — R921 Mammographic calcification found on diagnostic imaging of breast: Secondary | ICD-10-CM

## 2017-10-03 ENCOUNTER — Encounter: Payer: Self-pay | Admitting: Certified Nurse Midwife

## 2017-10-03 ENCOUNTER — Ambulatory Visit (INDEPENDENT_AMBULATORY_CARE_PROVIDER_SITE_OTHER): Payer: BLUE CROSS/BLUE SHIELD | Admitting: Certified Nurse Midwife

## 2017-10-03 ENCOUNTER — Other Ambulatory Visit: Payer: Self-pay

## 2017-10-03 VITALS — BP 100/72 | HR 70 | Resp 18 | Ht 65.0 in | Wt 188.0 lb

## 2017-10-03 DIAGNOSIS — Z01419 Encounter for gynecological examination (general) (routine) without abnormal findings: Secondary | ICD-10-CM | POA: Diagnosis not present

## 2017-10-03 DIAGNOSIS — Z0000 Encounter for general adult medical examination without abnormal findings: Secondary | ICD-10-CM | POA: Diagnosis not present

## 2017-10-03 DIAGNOSIS — N951 Menopausal and female climacteric states: Secondary | ICD-10-CM | POA: Diagnosis not present

## 2017-10-03 LAB — POCT URINALYSIS DIPSTICK
Bilirubin, UA: NEGATIVE
Glucose, UA: NEGATIVE
Ketones, UA: NEGATIVE
Leukocytes, UA: NEGATIVE
Nitrite, UA: NEGATIVE
Protein, UA: NEGATIVE
Urobilinogen, UA: NEGATIVE E.U./dL — AB
pH, UA: 5 (ref 5.0–8.0)

## 2017-10-03 NOTE — Patient Instructions (Signed)

## 2017-10-03 NOTE — Progress Notes (Signed)
57 y.o. G33P2002 Married  Caucasian Fe here for annual exam.  Menopausal no HRT. Denies vaginal bleeding. Sees MD for IBS, anxiety, labs and aex. Has still be dealing with headaches, participated in study and placed on Aimovig and felt so much better. Was taken off and now back on Klonipin and Aimovig, with hope that the headaches will resolve again. Still having heat and cold intolerance, but working with. "Just want this nausea and bowel issues to resolve". Vaginal dryness better with coconut oil, but still some discomfort. Taking time for self with PT who has worked with her back pain and it is gone! No other health issues today.  Patient's last menstrual period was 03/10/2013.          Sexually active: Yes.    The current method of family planning is vasectomy.    Exercising: Yes.    physical therapy pilates Smoker:  no  Health Maintenance: Pap:  08-06-14 neg HPV HR neg, 08-23-16 neg History of Abnormal Pap: no MMG:  5/19 breast biopsy see reports Self Breast exams: yes Colonoscopy:  2013 BMD:   none TDaP:  2009 due declines due to recent new medication injection yesterday, will return Shingles: 2013 Pneumonia: not done Hep C and HIV: both neg 2017 Labs: poct urine- rbc tr   reports that she has never smoked. She has never used smokeless tobacco. She reports that she does not drink alcohol or use drugs.  Past Medical History:  Diagnosis Date  . Anxiety   . Concussion 03-17-17  . Cystitis, interstitial    HAD BLADDER SX FOR SAME  . Depression   . Fainting spell   . Fibromyalgia   . Headache(784.0)   . IBS (irritable bowel syndrome)    TAKES PROBIOTICS  . Leg pain   . Migraine   . MVP (mitral valve prolapse)   . PONV (postoperative nausea and vomiting)   . Restless leg syndrome   . Ulcerative colitis ([place])   . Varicose veins     Past Surgical History:  Procedure Laterality Date  . ABDOMINOPLASTY  4/14  . BLADDER SURGERY     FOR IC  . BREAST BIOPSY Left 02-26-12    negative  . BREAST REDUCTION SURGERY  10/10/2011   Procedure: MAMMARY REDUCTION  (BREAST);  Surgeon: Charlene Brooke, MD;  Location: [facility];  Service: Plastics;  Laterality: Bilateral;  . LASIK    . NASAL SEPTUM SURGERY    . VASCULAR SURGERY     VARICOSE VEINS    Current Outpatient Medications  Medication Sig Dispense Refill  . AIMOVIG 140 MG/ML SOAJ Inject 1 mL (140 mg dose) into the skin every 30 (thirty) days.  5  . CALCIUM PO Take by mouth daily.    . Cetirizine HCl (ZYRTEC ALLERGY PO) Take 10 mg by mouth daily.    . chlorproMAZINE (THORAZINE) 25 MG tablet Take 1 tablet by mouth as needed.    . Cholecalciferol (VITAMIN D PO) Take 5,000 Int'l Units by mouth daily.    . clonazePAM (KLONOPIN) 1 MG tablet Take 1 mg by mouth at bedtime.  0  . Docusate Calcium (STOOL SOFTENER PO) Take by mouth at bedtime.    . Famotidine (PEPCID PO) Take by mouth. Once or twice daily    . Ketorolac Tromethamine (TORADOL IJ) Inject as directed as needed.    . lamoTRIgine (LAMICTAL) 100 MG tablet Take 100 mg by mouth daily.  0  . Multiple Vitamins-Minerals (CENTRUM SILVER PO) Take  1 tablet by mouth daily.    [name] omeprazole (PRILOSEC) 40 MG capsule TAKE 1 CAPSULE BY MOUTH 20 minutes BEFORE breakfast every DAY    . ondansetron (ZOFRAN) 8 MG tablet Take by mouth.    . polyethylene glycol powder (GLYCOLAX/MIRALAX) powder Take by mouth daily.    . Probiotic Product (PROBIOTIC PO) Take by mouth daily.    . promethazine (PHENERGAN) 25 MG tablet Take by mouth as needed.     . sertraline (ZOLOFT) 25 MG tablet Take 37.5 mg by mouth daily.   0  . Simethicone 180 MG CAPS Take by mouth daily.    [name] tiZANidine (ZANAFLEX) 4 MG tablet Take 1 tablet by mouth as needed.    . traZODone (DESYREL) 50 MG tablet TAKE 1 TO 2 TABLETS AT BEDTIME AS NEEDED FOR insomnia  3   No current facility-administered medications for this visit.     Family History  Problem Relation Age of Onset  . Osteoporosis Mother    . Atrial fibrillation Mother   . Diabetes Father   . Hypertension Father   . Breast cancer Maternal Grandmother   . Cancer Maternal Grandmother        ovarian  . Diabetes Paternal Grandmother   . Migraines Brother        CHRONIC    ROS:  Pertinent items are noted in HPI.  Otherwise, a comprehensive ROS was negative.  Exam:   BP 100/72   Pulse 70   Resp 18   Ht 5\' 5"  (1.651 m)   Wt 188 lb (85.3 kg)   LMP 03/10/2013   BMI 31.28 kg/m  Height: 5\' 5"  (165.1 cm) Ht Readings from Last 3 Encounters:  10/03/17 5\' 5"  (1.651 m)  08/23/16 5' 5.25" (1.657 m)  08/19/15 5\' 5"  (1.651 m)    General appearance: alert, cooperative and appears stated age Head: Normocephalic, without obvious abnormality, atraumatic Neck: no adenopathy, supple, symmetrical, trachea midline and thyroid normal to inspection and palpation Lungs: clear to auscultation bilaterally Breasts: normal appearance, no masses or tenderness, No nipple retraction or dimpling, No nipple discharge or bleeding, No axillary or supraclavicular adenopathy Heart: regular rate and rhythm Abdomen: soft, non-tender; no masses,  no organomegaly Extremities: extremities normal, atraumatic, no cyanosis or edema Skin: Skin color, texture, turgor normal. No rashes or lesions Lymph nodes: Cervical, supraclavicular, and axillary nodes normal. No abnormal inguinal nodes palpated Neurologic: Grossly normal   Pelvic: External genitalia:  no lesions              Urethra:  normal appearing urethra with no masses, tenderness or lesions              Bartholin's and Skene's: normal                 Vagina: normal appearing vagina with normal color and discharge, no lesions              Cervix: no cervical motion tenderness and no lesions              Pap taken: No. Bimanual Exam:  Uterus:  normal size, contour, position, consistency, mobility, non-tender              Adnexa: normal adnexa and no mass, fullness, tenderness                Rectovaginal: Confirms               Anus:  normal sphincter tone, no lesions  Chaperone  present: yes  A:  Well Woman with normal exam  Menopausal no HRT  Vaginal dryness responding to coconut oil  Chronic migraine headache now on Aimovig injection with improvement  IBS still having increased nausea and pain with GI management  P:   Reviewed health and wellness pertinent to exam  Aware of need to advise if vaginal bleeding  Discussed daily use for better results and Astroglide for sexual activity.  Continue follow up with MD as needed.  Discussed changing probiotic to another product with better coverage. Patient will consider and discuss with GI.  Pap smear: no   counseled on breast self exam, mammography screening, feminine hygiene, menopause, adequate intake of calcium and vitamin D, diet and exercise  return annually or prn  An After Visit Summary was printed and given to the patient.

## 2018-01-26 ENCOUNTER — Encounter: Payer: Self-pay | Admitting: *Deleted

## 2018-02-13 ENCOUNTER — Ambulatory Visit: Payer: BLUE CROSS/BLUE SHIELD | Admitting: Psychiatry

## 2018-03-02 DIAGNOSIS — F411 Generalized anxiety disorder: Secondary | ICD-10-CM | POA: Insufficient documentation

## 2018-03-02 DIAGNOSIS — F4310 Post-traumatic stress disorder, unspecified: Secondary | ICD-10-CM | POA: Insufficient documentation

## 2018-03-14 ENCOUNTER — Ambulatory Visit: Payer: BLUE CROSS/BLUE SHIELD | Admitting: Psychiatry

## 2018-03-14 ENCOUNTER — Encounter (INDEPENDENT_AMBULATORY_CARE_PROVIDER_SITE_OTHER): Payer: Self-pay

## 2018-03-14 ENCOUNTER — Encounter: Payer: Self-pay | Admitting: Psychiatry

## 2018-03-14 DIAGNOSIS — F411 Generalized anxiety disorder: Secondary | ICD-10-CM

## 2018-03-14 DIAGNOSIS — F5105 Insomnia due to other mental disorder: Secondary | ICD-10-CM | POA: Diagnosis not present

## 2018-03-14 DIAGNOSIS — F331 Major depressive disorder, recurrent, moderate: Secondary | ICD-10-CM | POA: Diagnosis not present

## 2018-03-14 DIAGNOSIS — F99 Mental disorder, not otherwise specified: Secondary | ICD-10-CM | POA: Diagnosis not present

## 2018-03-14 MED ORDER — CLONAZEPAM 1 MG PO TABS: 1.0000 mg | ORAL_TABLET | Freq: Every day | ORAL | 1 refills | Status: DC

## 2018-03-14 MED ORDER — SERTRALINE HCL 25 MG PO TABS: 37.5000 mg | ORAL_TABLET | Freq: Every day | ORAL | 1 refills | Status: DC

## 2018-03-14 NOTE — Progress Notes (Signed)
Marisa Hamilton [id number] 1960-07-06 57 y.o.  Subjective:   Patient ID:  Marisa Hamilton is a 57 y.o. (DOB May 16, 1960) female.  Chief Complaint:  Chief Complaint  Patient presents with  . Depression  . Anxiety  . Sleeping Problem    HPI Marisa Hamilton presents to the office today for follow-up of above. Overall feeling good and doesn't want change.  Cannot stop clonazepam and still sleep.  Control of HA has helped mood "all the difference in the world".  Better handling stressor of mother and in laws thourhg therapy and meds.  Patient reports stable mood and denies depressed or irritable moods.  Patient denies any recent difficulty with anxiety.  Patient denies difficulty with sleep initiation or maintenance.8 hours.  Denies appetite disturbance.  Patient reports that energy and motivation have been good.  Patient denies any difficulty with concentration.  Patient denies any suicidal ideation.  Failed psychiatric medication trials include Lexapro, lithium, amitriptyline, Emsam, duloxetine, Pristiq, valproic acid, Wellbutrin, fluoxetine, buspirone, Abilify, venlafaxine, clonidine, Topamax, Depakote, Deplin, carbamazepine.  Sleep med failures include amitriptyline, mirtazapine, trazodone, doxepin which caused nightmares, Ambien, gabapentin, Sonata, cyclobenzaprine, Xanax.  Review of Systems:  Review of Systems  Neurological: Positive for headaches. Negative for tremors and weakness.  Psychiatric/Behavioral: Negative for agitation, behavioral problems, confusion, decreased concentration, dysphoric mood, hallucinations, self-injury, sleep disturbance and suicidal ideas. The patient is nervous/anxious. The patient is not hyperactive.     Medications: I have reviewed the patient's current medications.  Current Outpatient Medications  Medication Sig Dispense Refill  . AIMOVIG 140 MG/ML SOAJ Inject 1 mL (140 mg dose) into the skin every 30 (thirty) days.  5  . chlorproMAZINE (THORAZINE) 25 MG tablet  Take 1 tablet by mouth as needed.    . clonazePAM (KLONOPIN) 1 MG tablet Take 1 tablet (1 mg total) by mouth at bedtime. 90 tablet 1  . Famotidine (PEPCID PO) Take by mouth. Once or twice daily    . Ketorolac Tromethamine (TORADOL IJ) Inject as directed as needed.    . lamoTRIgine (LAMICTAL) 100 MG tablet Take 100 mg by mouth daily.  0  . Magnesium Malate 1250 (141.7 Mg) MG TABS Take 1,250 mg by mouth daily.    . Multiple Vitamins-Minerals (CENTRUM SILVER PO) Take 1 tablet by mouth daily.    Marisa Hamilton (SYMPROIC) 0.2 MG TABS Take 1 tablet by mouth daily.    Marisa Hamilton omeprazole (PRILOSEC) 40 MG capsule TAKE 1 CAPSULE BY MOUTH 20 minutes BEFORE breakfast every DAY    . ondansetron (ZOFRAN) 8 MG tablet Take by mouth.    . Probiotic Product (PROBIOTIC PO) Take by mouth daily.    . sertraline (ZOLOFT) 25 MG tablet Take 1.5 tablets (37.5 mg total) by mouth daily. 135 tablet 1  . tiZANidine (ZANAFLEX) 4 MG tablet Take 1 tablet by mouth as needed.    . traZODone (DESYREL) 50 MG tablet TAKE 1 TO 2 TABLETS AT BEDTIME AS NEEDED FOR insomnia  3  . vitamin B-12 (CYANOCOBALAMIN) 500 MCG tablet Take 500 mcg by mouth daily.    Marisa Hamilton CALCIUM PO Take by mouth daily.    . Cetirizine HCl (ZYRTEC ALLERGY PO) Take 10 mg by mouth daily.    . Cholecalciferol (VITAMIN D PO) Take 7,500 Int'l Units by mouth daily.     . cyclobenzaprine (FLEXERIL) 5 MG tablet Take 5 mg by mouth at bedtime as needed for muscle spasms (take 1-2 tabs as needed).    Marisa Hamilton Calcium (STOOL SOFTENER PO) Take by  mouth at bedtime.    . polyethylene glycol powder (GLYCOLAX/MIRALAX) powder Take by mouth daily.    . promethazine (PHENERGAN) 25 MG tablet Take by mouth as needed.     . Simethicone 180 MG CAPS Take by mouth daily.     No current facility-administered medications for this visit.     Medication Side Effects: None  Allergies:  Allergies  Allergen Reactions  . Nsaids Other (See Comments)    Other reaction(s): Abdominal  Pain Ulcerative colitis    Past Medical History:  Diagnosis Date  . Anxiety   . Concussion 03-17-17  . Cystitis, interstitial    HAD BLADDER SX FOR SAME  . Depression   . Fainting spell   . Fibromyalgia   . Headache(784.0)   . IBS (irritable bowel syndrome)    TAKES PROBIOTICS  . Leg pain   . Migraine   . MVP (mitral valve prolapse)   . PONV (postoperative nausea and vomiting)   . Restless leg syndrome   . Ulcerative colitis ([place])   . Varicose veins     Family History  Problem Relation Age of Onset  . Osteoporosis Mother   . Atrial fibrillation Mother   . Diabetes Father   . Hypertension Father   . Breast cancer Maternal Grandmother   . Cancer Maternal Grandmother        ovarian  . Diabetes Paternal Grandmother   . Migraines Brother        CHRONIC    Social History   Socioeconomic History  . Marital status: Married    Spouse name: Not on file  . Number of children: Not on file  . Years of education: Not on file  . Highest education level: Not on file  Occupational History  . Not on file  Social Needs  . Financial resource strain: Not on file  . Food insecurity:    Worry: Not on file    Inability: Not on file  . Transportation needs:    Medical: Not on file    Non-medical: Not on file  Tobacco Use  . Smoking status: Never Smoker  . Smokeless tobacco: Never Used  Substance and Sexual Activity  . Alcohol use: No  . Drug use: No  . Sexual activity: Yes    Partners: Male    Birth control/protection: Other-see comments    Comment: husband vasectomy  Lifestyle  . Physical activity:    Days per week: Not on file    Minutes per session: Not on file  . Stress: Not on file  Relationships  . Social connections:    Talks on phone: Not on file    Gets together: Not on file    Attends religious service: Not on file    Active member of club or organization: Not on file    Attends meetings of clubs or organizations: Not on file    Relationship status: Not  on file  . Intimate partner violence:    Fear of current or ex partner: Not on file    Emotionally abused: Not on file    Physically abused: Not on file    Forced sexual activity: Not on file  Other Topics Concern  . Not on file  Social History Narrative  . Not on file    Past Medical History, Surgical history, Social history, and Family history were reviewed and updated as appropriate.   Please see review of systems for further details on the patient's review from today.   Objective:  Physical Exam:  LMP 03/10/2013   Physical Exam  Constitutional: She is oriented to person, place, and time. She appears well-developed. No distress.  Musculoskeletal: She exhibits no deformity.  Neurological: She is alert and oriented to person, place, and time. She displays no atrophy. Coordination and gait normal.  Psychiatric: She has a normal mood and affect. Her speech is normal and behavior is normal. Judgment and thought content normal. Her mood appears not anxious. Her affect is not angry, not blunt, not labile and not inappropriate. Cognition and memory are normal. She does not exhibit a depressed mood. She expresses no homicidal and no suicidal ideation. She expresses no suicidal plans and no homicidal plans.  Insight intact. No auditory or visual hallucinations. No delusions.  She is attentive.    Lab Review:     Component Value Date/Time   NA 134 (L) 10/18/2016 1830   K 7.1 (HH) 10/18/2016 1830   CL 109 10/18/2016 1830   CO2 29 08/04/2013 1100   GLUCOSE 103 (H) 10/18/2016 1830   BUN 21 (H) 10/18/2016 1830   CREATININE 0.90 10/18/2016 1830   CREATININE 0.87 08/04/2013 1100   CALCIUM 9.1 08/04/2013 1100   PROT 6.4 08/04/2013 1100   ALBUMIN 4.0 08/04/2013 1100   AST 16 08/04/2013 1100   ALT 16 08/04/2013 1100   ALKPHOS 56 08/04/2013 1100   BILITOT 0.4 08/04/2013 1100       Component Value Date/Time   WBC 8.3 12/10/2013 1426   RBC 4.73 12/10/2013 1426   HGB 14.3 10/18/2016  1830   HGB 13.8 07/30/2013 1333   HCT 42.0 10/18/2016 1830   PLT 239 12/10/2013 1426   MCV 87.5 12/10/2013 1426   MCH 29.8 12/10/2013 1426   MCHC 34.1 12/10/2013 1426   RDW 13.8 12/10/2013 1426   LYMPHSABS 2.4 12/10/2013 1426   MONOABS 0.6 12/10/2013 1426   EOSABS 0.2 12/10/2013 1426   BASOSABS 0.0 12/10/2013 1426    No results found for: POCLITH, LITHIUM   No results found for: PHENYTOIN, PHENOBARB, VALPROATE, CBMZ   .res Assessment: Plan:    Major depressive disorder, recurrent episode, moderate (HCC)  Generalized anxiety disorder  Insomnia due to other mental disorder  Please see After Visit Summary for patient specific instructions.  Greater than 50% of face to face time with patient was spent on counseling and coordination of care. 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  Most of the improvement has come through therapy and control of the headaches with some benefit from the medication as well particularly the sertraline.  Sertraline is also help with her anxiety.  The benefits of the meds for sleep are clear as we tried to discontinue clonazepam and she had worsening of not only sleep but also her other psychiatric symptoms. The clonazepam is medically necessary.  She is failed multiple other sleep medications.  She is aware of sleep hygiene issues in detail.  She understands of the risk of long-term cognitive problems related to it.  She accepts those risks.  He is tolerating meds well.  She does not want any medication changes.  Discussed gun safety course. I have no objection.  She does not have suicidal thoughts nor homicidal thoughts and would not use a gun to harm anyone except in self-defense emergencies.  FU 6 mos  Lynder Parents MD, DFAPA  Future Appointments  Date Time Provider [place]  10/08/2018  1:00 PM Regina Eck, CNM [place] None  No orders of the defined types were placed in this  encounter.     -------------------------------

## 2018-04-23 ENCOUNTER — Other Ambulatory Visit: Payer: Self-pay

## 2018-04-23 MED ORDER — LAMOTRIGINE 100 MG PO TABS: 100.0000 mg | ORAL_TABLET | Freq: Every day | ORAL | 1 refills | Status: DC

## 2018-06-07 IMAGING — MG DIGITAL SCREENING BILATERAL MAMMOGRAM WITH TOMO AND CAD
8 series · 8 of 24 positions shown · non-contrast
Comparison: Previous exam(s).

CLINICAL DATA: Screening.

EXAM:
DIGITAL SCREENING BILATERAL MAMMOGRAM WITH TOMO AND CAD

[L MLO synth-2D]
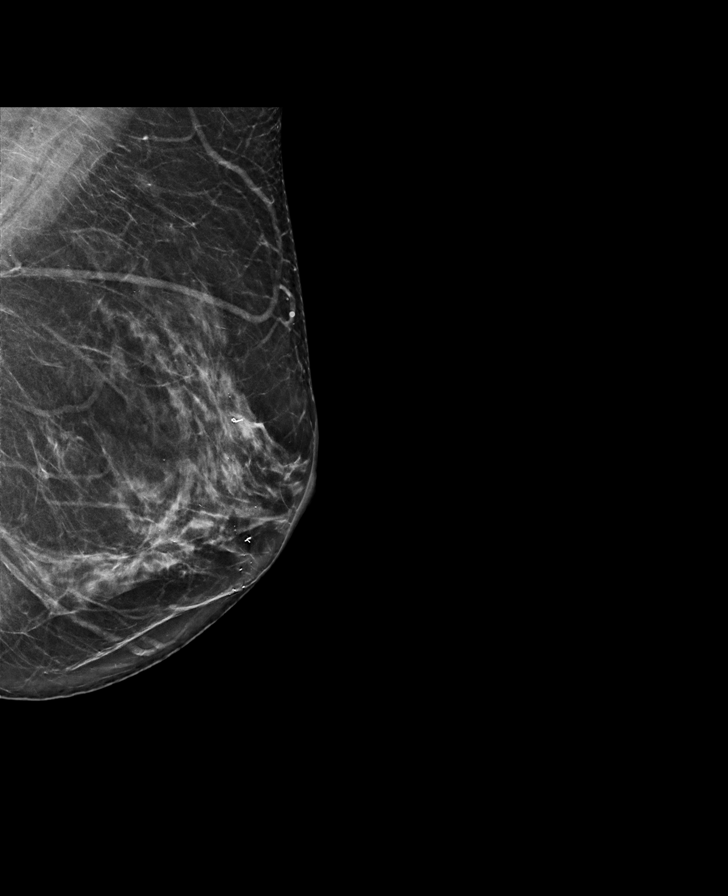

[L CC synth-2D]
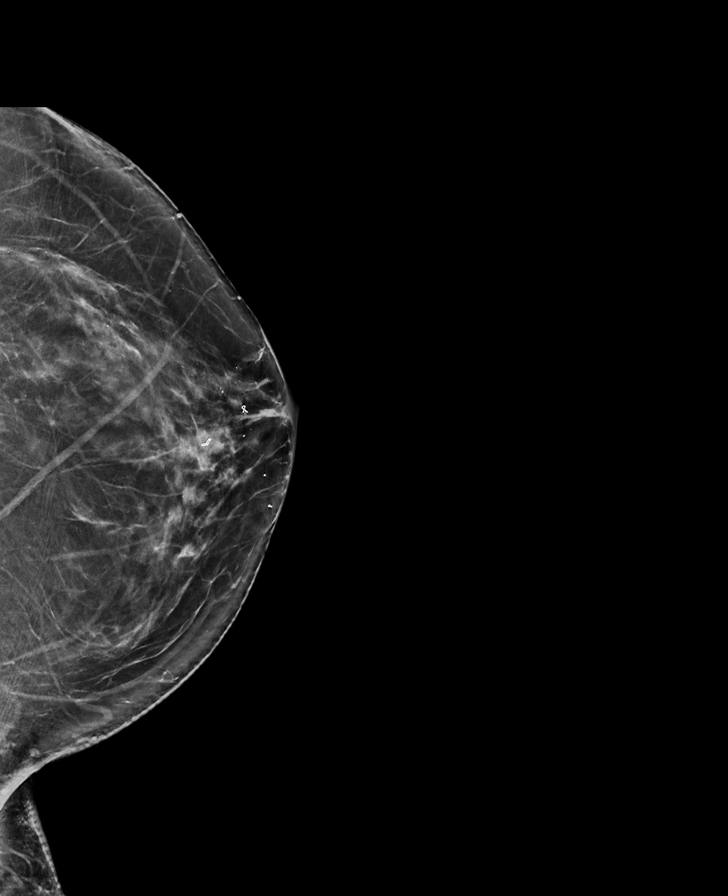

[R CC synth-2D]
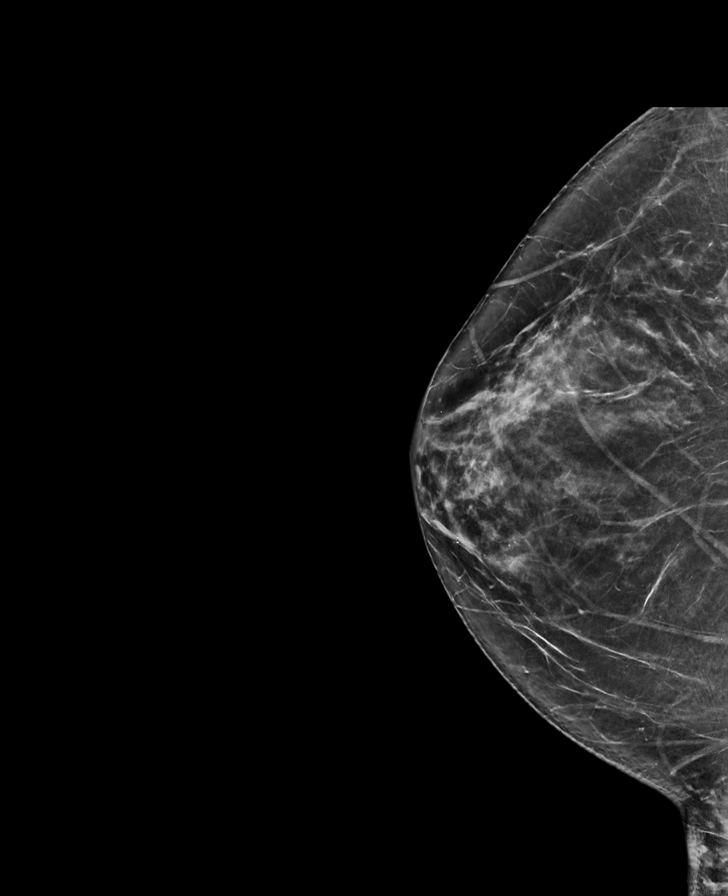

[R MLO synth-2D]
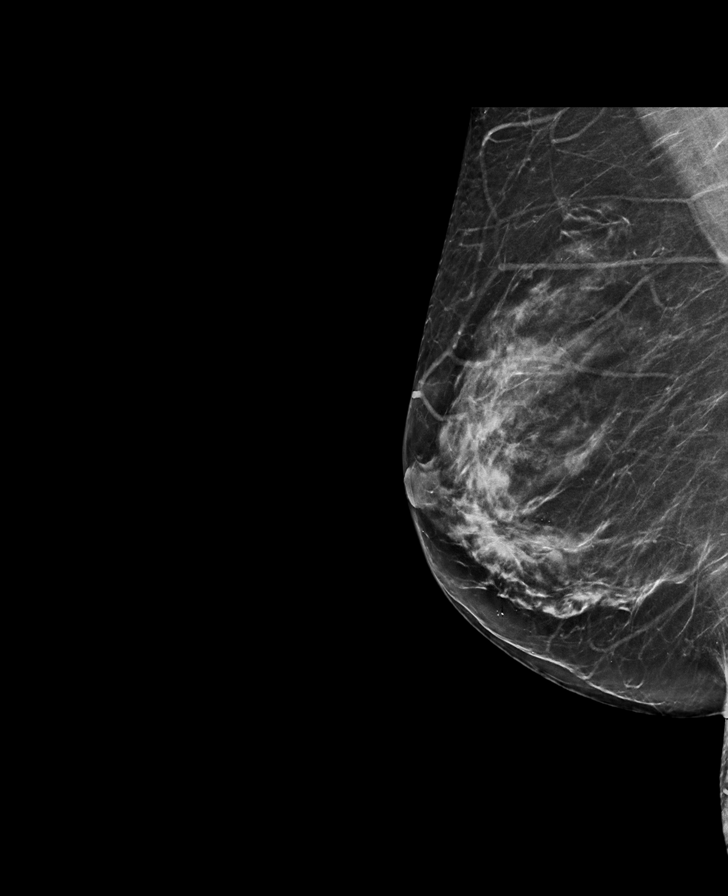

[L MLO tomo · tomo slice 37/73.0]
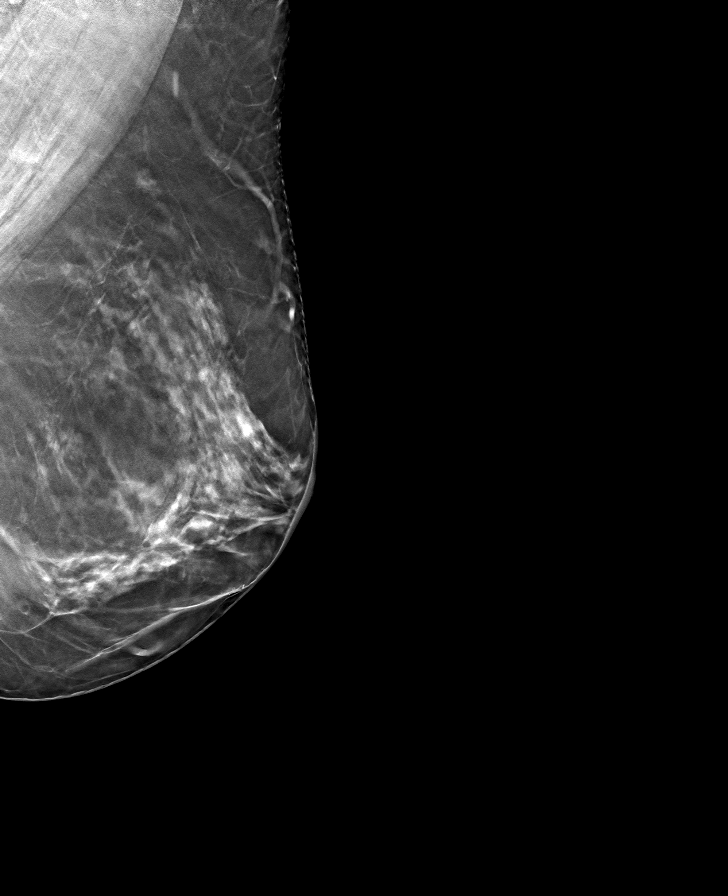

[L CC tomo · tomo slice 39/76.0]
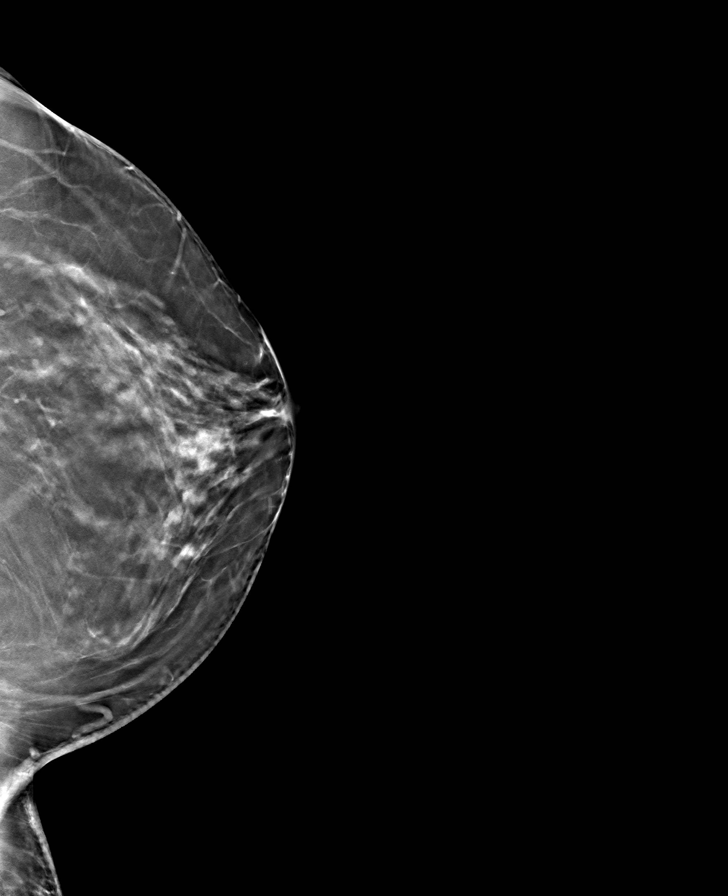

[R MLO tomo · tomo slice 37/74.0]
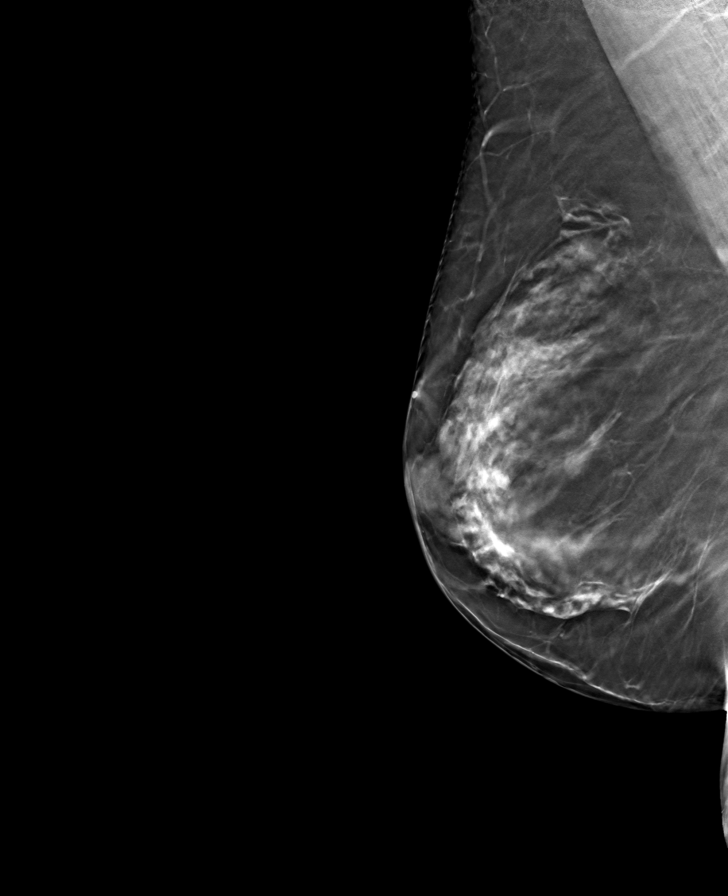

[R CC tomo · tomo slice 37/72.0]
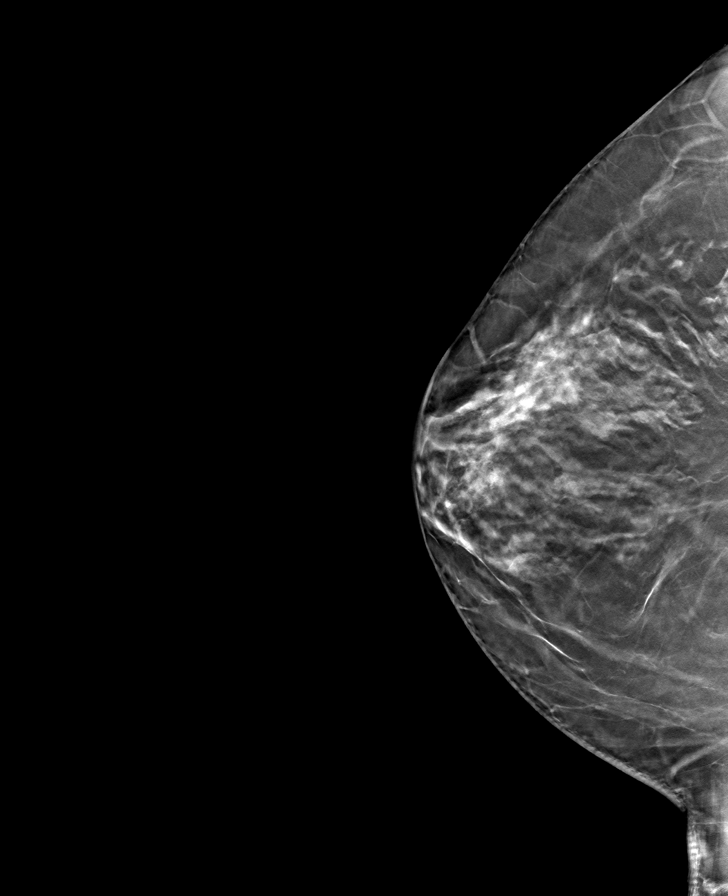

[8 of 24 positions shown; findings below may reference images not displayed]

ACR Breast Density Category b: There are scattered areas of
fibroglandular density.
FINDINGS: In the left breast, calcifications warrant further evaluation. In
the right breast, no findings suspicious for malignancy. Images were
processed with CAD.
IMPRESSION: Further evaluation is suggested for calcifications in the left
breast.

RECOMMENDATION:
Diagnostic mammogram of the left breast. (Code:[id number]-N-GGC)

The patient will be contacted regarding the findings, and additional
imaging will be scheduled.

BI-RADS CATEGORY  0: Incomplete. Need additional imaging evaluation
and/or prior mammograms for comparison.

## 2018-07-10 ENCOUNTER — Other Ambulatory Visit: Payer: Self-pay

## 2018-07-10 MED ORDER — SERTRALINE HCL 25 MG PO TABS: 37.5000 mg | ORAL_TABLET | Freq: Every day | ORAL | 1 refills | Status: DC

## 2018-07-23 ENCOUNTER — Other Ambulatory Visit: Payer: Self-pay

## 2018-07-23 ENCOUNTER — Telehealth: Payer: Self-pay | Admitting: Psychiatry

## 2018-07-23 MED ORDER — TRAZODONE HCL 50 MG PO TABS: 50.0000 mg | ORAL_TABLET | Freq: Every day | ORAL | 0 refills | Status: DC

## 2018-07-23 NOTE — Telephone Encounter (Signed)
rx submitted to pharmacy.  

## 2018-07-23 NOTE — Telephone Encounter (Signed)
Pt called need new Rx sent to [facility] for Trazodone. No refills.

## 2018-07-25 ENCOUNTER — Other Ambulatory Visit: Payer: Self-pay

## 2018-08-30 ENCOUNTER — Other Ambulatory Visit: Payer: Self-pay | Admitting: Certified Nurse Midwife

## 2018-08-30 DIAGNOSIS — Z1231 Encounter for screening mammogram for malignant neoplasm of breast: Secondary | ICD-10-CM

## 2018-09-02 ENCOUNTER — Telehealth: Payer: Self-pay | Admitting: Psychiatry

## 2018-09-02 ENCOUNTER — Other Ambulatory Visit: Payer: Self-pay

## 2018-09-02 MED ORDER — TRAZODONE HCL 100 MG PO TABS: 100.0000 mg | ORAL_TABLET | Freq: Every day | ORAL | 0 refills | Status: DC

## 2018-09-02 NOTE — Telephone Encounter (Signed)
Patient stated refill for Trazodone was sent on 07/23/2018 for 50 mg 1 tab at bedtime, patient stated she will be out of medication 09/08/2018.  Would like a script sent to [facility] for 100 mg., instead of 50 mg. Pt has appt., 04/30

## 2018-09-02 NOTE — Telephone Encounter (Signed)
Dose changed to 100 mg tablets

## 2018-09-10 ENCOUNTER — Telehealth: Payer: Self-pay | Admitting: Psychiatry

## 2018-09-10 NOTE — Telephone Encounter (Signed)
Pt is not out of refills,but would like e-scripts available when she is out.Cancelled appt for Thurs stated she is doing fine at this time. Stated to remind CC her Trazodone is 182m not 50. [facility]

## 2018-09-11 ENCOUNTER — Other Ambulatory Visit: Payer: Self-pay

## 2018-09-11 MED ORDER — SERTRALINE HCL 25 MG PO TABS: 37.5000 mg | ORAL_TABLET | Freq: Every day | ORAL | 0 refills | Status: DC

## 2018-09-11 MED ORDER — LAMOTRIGINE 100 MG PO TABS: 100.0000 mg | ORAL_TABLET | Freq: Every day | ORAL | 0 refills | Status: DC

## 2018-09-11 NOTE — Telephone Encounter (Signed)
submitted

## 2018-09-12 ENCOUNTER — Ambulatory Visit: Payer: BLUE CROSS/BLUE SHIELD | Admitting: Psychiatry

## 2018-10-08 ENCOUNTER — Ambulatory Visit: Payer: BLUE CROSS/BLUE SHIELD | Admitting: Certified Nurse Midwife

## 2018-10-18 ENCOUNTER — Other Ambulatory Visit: Payer: Self-pay

## 2018-10-18 MED ORDER — LAMOTRIGINE 100 MG PO TABS: 100.0000 mg | ORAL_TABLET | Freq: Every day | ORAL | 0 refills | Status: DC

## 2018-10-30 ENCOUNTER — Ambulatory Visit
Admission: RE | Admit: 2018-10-30 | Discharge: 2018-10-30 | Disposition: A | Discharge status: Home / Self Care | Payer: BC Managed Care – PPO | Source: Ambulatory Visit | Attending: Certified Nurse Midwife | Admitting: Certified Nurse Midwife

## 2018-10-30 ENCOUNTER — Other Ambulatory Visit: Payer: Self-pay

## 2018-10-30 DIAGNOSIS — Z1231 Encounter for screening mammogram for malignant neoplasm of breast: Secondary | ICD-10-CM

## 2018-11-20 ENCOUNTER — Other Ambulatory Visit (HOSPITAL_COMMUNITY)
Admission: RE | Admit: 2018-11-20 | Discharge: 2018-11-20 | Disposition: A | Discharge status: Home / Self Care | Payer: BC Managed Care – PPO | Source: Ambulatory Visit | Attending: Certified Nurse Midwife | Admitting: Certified Nurse Midwife

## 2018-11-20 ENCOUNTER — Other Ambulatory Visit: Payer: Self-pay

## 2018-11-20 ENCOUNTER — Ambulatory Visit (INDEPENDENT_AMBULATORY_CARE_PROVIDER_SITE_OTHER): Payer: BC Managed Care – PPO | Admitting: Certified Nurse Midwife

## 2018-11-20 ENCOUNTER — Encounter: Payer: Self-pay | Admitting: Certified Nurse Midwife

## 2018-11-20 VITALS — BP 118/80 | HR 68 | Temp 97.2°F | Resp 16 | Ht 65.0 in | Wt 194.0 lb

## 2018-11-20 DIAGNOSIS — N951 Menopausal and female climacteric states: Secondary | ICD-10-CM

## 2018-11-20 DIAGNOSIS — Z01419 Encounter for gynecological examination (general) (routine) without abnormal findings: Secondary | ICD-10-CM | POA: Diagnosis not present

## 2018-11-20 DIAGNOSIS — Z124 Encounter for screening for malignant neoplasm of cervix: Secondary | ICD-10-CM | POA: Diagnosis not present

## 2018-11-20 DIAGNOSIS — Z23 Encounter for immunization: Secondary | ICD-10-CM

## 2018-11-20 NOTE — Progress Notes (Signed)
58 y.o. G23P2002 Married  Caucasian Fe here for annual exam. Menopausal no HRT. Patient has been seeing [facility] for allergies. Continues to always feel hot, not flashes or night sweats, just hot. Has started on Testosterone for weight loss and is working well. Sees PCP for labs and anxiety medication. All stable at present. Some vaginal dryness, but has improved. Some back and muscle issues and has started with Pilates and sees chiropractor which is helping. No other health issues today.  Patient's last menstrual period was 03/10/2013.          Sexually active: Yes.    The current method of family planning is vasectomy.    Exercising: Yes.    pilates Smoker:  no  Review of Systems  Constitutional:       Hot  HENT: Negative.   Eyes: Negative.   Respiratory: Negative.   Cardiovascular: Negative.   Gastrointestinal: Negative.   Genitourinary: Negative.   Musculoskeletal: Negative.   Skin: Negative.   Neurological: Negative.   Endo/Heme/Allergies: Negative.   Psychiatric/Behavioral: Negative.     Health Maintenance: Pap:  08-23-16 neg History of Abnormal Pap: no MMG:  10-30-2018 category b density birads 1:neg Self Breast exams: yes Colonoscopy:  2013 BMD:   none TDaP:  2009 requests update today Shingles: 2013 Pneumonia: maybe with PCP Hep C and HIV: both neg 2017 Labs: PCP   reports that she has never smoked. She has never used smokeless tobacco. She reports that she does not drink alcohol or use drugs.  Past Medical History:  Diagnosis Date  . Anxiety   . Concussion 03-17-17  . Cystitis, interstitial    HAD BLADDER SX FOR SAME  . Depression   . Fainting spell   . Fibromyalgia   . Headache(784.0)   . IBS (irritable bowel syndrome)    TAKES PROBIOTICS  . Leg pain   . Migraine   . MVP (mitral valve prolapse)   . PONV (postoperative nausea and vomiting)   . Restless leg syndrome   . Ulcerative colitis ([place])   . Varicose veins     Past Surgical  History:  Procedure Laterality Date  . ABDOMINOPLASTY  4/14  . BLADDER SURGERY     FOR IC  . BREAST BIOPSY Left 02-26-12   negative  . BREAST REDUCTION SURGERY  10/10/2011   Procedure: MAMMARY REDUCTION  (BREAST);  Surgeon: Charlene Brooke, MD;  Location: [facility];  Service: Plastics;  Laterality: Bilateral;  . LASIK    . NASAL SEPTUM SURGERY    . REDUCTION MAMMAPLASTY Bilateral   . VASCULAR SURGERY     VARICOSE VEINS    Current Outpatient Medications  Medication Sig Dispense Refill  . AIMOVIG 140 MG/ML SOAJ Inject 1 mL (140 mg dose) into the skin every 30 (thirty) days.  5  . CALCIUM PO Take by mouth daily.    . Cetirizine HCl (ZYRTEC ALLERGY PO) Take 10 mg by mouth daily.    . chlorproMAZINE (THORAZINE) 25 MG tablet Take 1 tablet by mouth as needed.    . Cholecalciferol (VITAMIN D PO) Take 7,500 Int'l Units by mouth daily.     . clonazePAM (KLONOPIN) 1 MG tablet Take 1 tablet (1 mg total) by mouth at bedtime. 90 tablet 1  . cyclobenzaprine (FLEXERIL) 5 MG tablet Take 5 mg by mouth at bedtime as needed for muscle spasms (take 1-2 tabs as needed).    [name] Calcium (STOOL SOFTENER PO) Take by mouth at bedtime.    [name]  Famotidine (PEPCID PO) Take by mouth. Once or twice daily    . Ketorolac Tromethamine (TORADOL IJ) Inject as directed as needed.    . lamoTRIgine (LAMICTAL) 100 MG tablet Take 1 tablet (100 mg total) by mouth daily. 90 tablet 0  . Magnesium Malate 1250 (141.7 Mg) MG TABS Take 1,250 mg by mouth daily.    . Multiple Vitamins-Minerals (CENTRUM SILVER PO) Take 1 tablet by mouth daily.    [name] (SYMPROIC) 0.2 MG TABS Take 1 tablet by mouth daily.    [name] omeprazole (PRILOSEC) 40 MG capsule TAKE 1 CAPSULE BY MOUTH 20 minutes BEFORE breakfast every DAY    . ondansetron (ZOFRAN) 8 MG tablet Take by mouth.    . polyethylene glycol powder (GLYCOLAX/MIRALAX) powder Take by mouth daily.    . Probiotic Product (PROBIOTIC PO) Take by mouth daily.     . promethazine (PHENERGAN) 25 MG tablet Take by mouth as needed.     . sertraline (ZOLOFT) 25 MG tablet Take 1.5 tablets (37.5 mg total) by mouth daily. 135 tablet 0  . Simethicone 180 MG CAPS Take by mouth daily.    [name] tiZANidine (ZANAFLEX) 4 MG tablet Take 1 tablet by mouth as needed.    . traZODone (DESYREL) 100 MG tablet Take 1 tablet (100 mg total) by mouth at bedtime. 90 tablet 0  . vitamin B-12 (CYANOCOBALAMIN) 500 MCG tablet Take 500 mcg by mouth daily.     No current facility-administered medications for this visit.     Family History  Problem Relation Age of Onset  . Osteoporosis Mother   . Atrial fibrillation Mother   . Diabetes Father   . Hypertension Father   . Breast cancer Maternal Grandmother   . Cancer Maternal Grandmother        ovarian  . Diabetes Paternal Grandmother   . Migraines Brother        CHRONIC    ROS:  Pertinent items are noted in HPI.  Otherwise, a comprehensive ROS was negative.  Exam:   LMP 03/10/2013    Ht Readings from Last 3 Encounters:  10/03/17 5\' 5"  (1.651 m)  08/23/16 5' 5.25" (1.657 m)  08/19/15 5\' 5"  (1.651 m)    General appearance: alert, cooperative and appears stated age Head: Normocephalic, without obvious abnormality, atraumatic Neck: no adenopathy, supple, symmetrical, trachea midline and thyroid normal to inspection and palpation Lungs: clear to auscultation bilaterally Breasts: normal appearance, no masses or tenderness, No nipple retraction or dimpling, No nipple discharge or bleeding, No axillary or supraclavicular adenopathy Heart: regular rate and rhythm Abdomen: soft, non-tender; no masses,  no organomegaly Extremities: extremities normal, atraumatic, no cyanosis or edema Skin: Skin color, texture, turgor normal. No rashes or lesions Lymph nodes: Cervical, supraclavicular, and axillary nodes normal. No abnormal inguinal nodes palpated Neurologic: Grossly normal   Pelvic: External genitalia:  no lesions               Urethra:  normal appearing urethra with no masses, tenderness or lesions              Bartholin's and Skene's: normal                 Vagina: normal appearing vagina with normal color and discharge, no lesions              Cervix: no cervical motion tenderness and no lesions              Pap taken: Yes.  Bimanual Exam:  Uterus:  normal size, contour, position, consistency, mobility, non-tender and anteverted              Adnexa: normal adnexa and no mass, fullness, tenderness               Rectovaginal: Confirms               Anus:  normal sphincter tone, no lesions  Chaperone present: yes  A:  Well Woman with normal exam  Menopausal no HRT  Testosterone cream for weight loss and libido working well  PCP management of labs, Klonipin, Zoloft  Immunization due  TDAP  BMD due  P:   Reviewed health and wellness pertinent to exam  Stressed importance of notifying if vaginal bleeding  Continue follow up with [facility] for management and consider discussing her feeling hot all the time.  Continue follow up with PCP as indicated.  Requests TDAP  Order for BMD sent, patient to call and schedule  Pap smear: yes   counseled on breast self exam, mammography screening, feminine hygiene, osteoporosis, adequate intake of calcium and vitamin D, diet and exercise, Kegel's exercises  return annually or prn  An After Visit Summary was printed and given to the patient.

## 2018-11-22 LAB — CYTOLOGY - PAP
Diagnosis: NEGATIVE
HPV: NOT DETECTED

## 2018-12-03 ENCOUNTER — Other Ambulatory Visit: Payer: Self-pay

## 2018-12-03 ENCOUNTER — Telehealth: Payer: Self-pay | Admitting: Psychiatry

## 2018-12-03 MED ORDER — TRAZODONE HCL 100 MG PO TABS: 100.0000 mg | ORAL_TABLET | Freq: Every day | ORAL | 0 refills | Status: DC

## 2018-12-03 NOTE — Telephone Encounter (Signed)
Patient need refill on Trazadone to be sent to [facility] [phone] patient has appointment on 08/10 patient has 4 pills left

## 2018-12-03 NOTE — Telephone Encounter (Signed)
Refill submitted. 

## 2018-12-23 ENCOUNTER — Encounter: Payer: Self-pay | Admitting: Psychiatry

## 2018-12-23 ENCOUNTER — Ambulatory Visit (INDEPENDENT_AMBULATORY_CARE_PROVIDER_SITE_OTHER): Payer: BC Managed Care – PPO | Admitting: Psychiatry

## 2018-12-23 ENCOUNTER — Other Ambulatory Visit: Payer: Self-pay

## 2018-12-23 DIAGNOSIS — F5105 Insomnia due to other mental disorder: Secondary | ICD-10-CM

## 2018-12-23 DIAGNOSIS — F411 Generalized anxiety disorder: Secondary | ICD-10-CM | POA: Diagnosis not present

## 2018-12-23 DIAGNOSIS — F3342 Major depressive disorder, recurrent, in full remission: Secondary | ICD-10-CM | POA: Diagnosis not present

## 2018-12-23 DIAGNOSIS — F99 Mental disorder, not otherwise specified: Secondary | ICD-10-CM

## 2018-12-23 MED ORDER — CLONAZEPAM 1 MG PO TABS: 1.0000 mg | ORAL_TABLET | Freq: Every day | ORAL | 1 refills | Status: DC

## 2018-12-23 MED ORDER — SERTRALINE HCL 25 MG PO TABS: 37.5000 mg | ORAL_TABLET | Freq: Every day | ORAL | 1 refills | Status: DC

## 2018-12-23 MED ORDER — TRAZODONE HCL 100 MG PO TABS: 100.0000 mg | ORAL_TABLET | Freq: Every day | ORAL | 1 refills | Status: DC

## 2018-12-23 MED ORDER — LAMOTRIGINE 100 MG PO TABS: 100.0000 mg | ORAL_TABLET | Freq: Every day | ORAL | 1 refills | Status: DC

## 2018-12-23 NOTE — Progress Notes (Signed)
Marisa Hamilton [id number] 1961/01/14 58 y.o.  Subjective:   Patient ID:  Marisa Hamilton is a 58 y.o. (DOB 03-Sep-1960) female.  Chief Complaint:  Chief Complaint  Patient presents with  . Follow-up    depression, anxiety sleep    HPI Marisa Hamilton presents to the office today for follow-up of depression and anxiety  Last seen in October.  No meds were changed.  Still Overall feeling good and doesn't want change.  Cannot stop clonazepam and still sleep.  Sleep remained steady.  Control of HA has helped mood "all the difference in the world".  Better handling stressor of mother and in laws thourhg therapy and meds.  Patient reports stable mood and denies depressed or irritable moods.  Patient denies any recent difficulty with anxiety.  Patient denies difficulty with sleep initiation or maintenance.8 hours.  Denies appetite disturbance.  Patient reports that energy and motivation have been good.  Patient denies any difficulty with concentration.  Patient denies any suicidal ideation.  Failed psychiatric medication trials include Lexapro, lithium, amitriptyline, Emsam, duloxetine, Pristiq, valproic acid, Wellbutrin, fluoxetine, buspirone, Abilify, venlafaxine, clonidine, Topamax, Depakote, Deplin, carbamazepine.  Sleep med failures include amitriptyline, mirtazapine, trazodone, doxepin which caused nightmares, Ambien, gabapentin, Sonata, cyclobenzaprine, Xanax.  Review of Systems:  Review of Systems  Neurological: Positive for headaches. Negative for tremors and weakness.  Psychiatric/Behavioral: Negative for agitation, behavioral problems, confusion, decreased concentration, dysphoric mood, hallucinations, self-injury, sleep disturbance and suicidal ideas. The patient is nervous/anxious. The patient is not hyperactive.     Medications: I have reviewed the patient's current medications.  Current Outpatient Medications  Medication Sig Dispense Refill  . AIMOVIG 140 MG/ML SOAJ Inject 1 mL (140 mg  dose) into the skin every 30 (thirty) days.  5  . baclofen (LIORESAL) 10 MG tablet Take 10 mg by mouth 3 (three) times daily.    [name] Hamilton PO Take by mouth daily.    . Cetirizine HCl (ZYRTEC ALLERGY PO) Take 10 mg by mouth daily.    . chlorproMAZINE (THORAZINE) 25 MG tablet Take 1 tablet by mouth as needed.    . Cholecalciferol (VITAMIN D PO) Take 7,500 Int'l Units by mouth daily.     . Cyanocobalamin (B-12 PO) Take 5,000 mcg by mouth.    . cyclobenzaprine (FLEXERIL) 5 MG tablet Take 5 mg by mouth at bedtime as needed for muscle spasms (take 1-2 tabs as needed).    Marisa Hamilton (STOOL SOFTENER PO) Take by mouth at bedtime.    . Famotidine (PEPCID PO) Take by mouth. Once or twice daily    . Ketorolac Tromethamine (TORADOL IJ) Inject as directed as needed.    [name] MAGNESIUM PO Take 1,350 mg by mouth. 3 pills at night    . Multiple Vitamins-Minerals (CENTRUM SILVER PO) Take 1 tablet by mouth daily.    Marisa Hamilton (SYMPROIC) 0.2 MG TABS Take 1 tablet by mouth daily.    . NURTEC 75 MG TBDP TAKE 1 TABLET BY MOUTH AT ONSET OF FOR MIGRAINE    . omeprazole (PRILOSEC) 40 MG capsule TAKE 1 CAPSULE BY MOUTH 20 minutes BEFORE breakfast every DAY    . ondansetron (ZOFRAN) 8 MG tablet Take by mouth.    . polyethylene glycol powder (GLYCOLAX/MIRALAX) powder Take by mouth daily.    . Probiotic Product (PROBIOTIC PO) Take by mouth daily.    . promethazine (PHENERGAN) 25 MG tablet Take by mouth as needed.     [name] tiZANidine (ZANAFLEX) 4 MG tablet Take 1 tablet  by mouth as needed.    [name] VITAMIN K PO Take 150 mcg by mouth.    . clonazePAM (KLONOPIN) 1 MG tablet Take 1 tablet (1 mg total) by mouth at bedtime. 90 tablet 1  . lamoTRIgine (LAMICTAL) 100 MG tablet Take 1 tablet (100 mg total) by mouth daily. 90 tablet 1  . sertraline (ZOLOFT) 25 MG tablet Take 1.5 tablets (37.5 mg total) by mouth daily. 135 tablet 1  . traZODone (DESYREL) 100 MG tablet Take 1 tablet (100 mg total) by mouth at bedtime. 90  tablet 1   No current facility-administered medications for this visit.     Medication Side Effects: None  Allergies:  Allergies  Allergen Reactions  . Nsaids Other (See Comments)    Other reaction(s): Abdominal Pain Ulcerative colitis    Past Medical History:  Diagnosis Date  . Anxiety   . Concussion 03-17-17  . Cystitis, interstitial    HAD BLADDER SX FOR SAME  . Depression   . Fainting spell   . Fibromyalgia   . Headache(784.0)   . IBS (irritable bowel syndrome)    TAKES PROBIOTICS  . Leg pain   . Migraine   . MVP (mitral valve prolapse)   . PONV (postoperative nausea and vomiting)   . Restless leg syndrome   . Ulcerative colitis ([place])   . Varicose veins     Family History  Problem Relation Age of Onset  . Osteoporosis Mother   . Atrial fibrillation Mother   . Diabetes Father   . Hypertension Father   . Breast cancer Maternal Grandmother   . Cancer Maternal Grandmother        ovarian  . Diabetes Paternal Grandmother   . Migraines Brother        CHRONIC    Social History   Socioeconomic History  . Marital status: Married    Spouse name: Not on file  . Number of children: Not on file  . Years of education: Not on file  . Highest education level: Not on file  Occupational History  . Not on file  Social Needs  . Financial resource strain: Not on file  . Food insecurity    Worry: Not on file    Inability: Not on file  . Transportation needs    Medical: Not on file    Non-medical: Not on file  Tobacco Use  . Smoking status: Never Smoker  . Smokeless tobacco: Never Used  Substance and Sexual Activity  . Alcohol use: Yes    Alcohol/week: 1.0 standard drinks    Types: 1 Standard drinks or equivalent per week  . Drug use: No  . Sexual activity: Yes    Partners: Male    Birth control/protection: Other-see comments    Comment: husband vasectomy  Lifestyle  . Physical activity    Days per week: Not on file    Minutes per session: Not on file  .  Stress: Not on file  Relationships  . Social Herbalist on phone: Not on file    Gets together: Not on file    Attends religious service: Not on file    Active member of club or organization: Not on file    Attends meetings of clubs or organizations: Not on file    Relationship status: Not on file  . Intimate partner violence    Fear of current or ex partner: Not on file    Emotionally abused: Not on file    Physically  abused: Not on file    Forced sexual activity: Not on file  Other Topics Concern  . Not on file  Social History Narrative  . Not on file    Past Medical History, Surgical history, Social history, and Family history were reviewed and updated as appropriate.   Please see review of systems for further details on the patient's review from today.   Objective:   Physical Exam:  LMP 03/10/2013   Physical Exam Constitutional:      General: She is not in acute distress.    Appearance: She is well-developed.  Musculoskeletal:        General: No deformity.  Neurological:     Mental Status: She is alert and oriented to person, place, and time.     Motor: No atrophy.     Coordination: Coordination normal.     Gait: Gait normal.  Psychiatric:        Attention and Perception: She is attentive.        Mood and Affect: Mood is not anxious or depressed. Affect is not labile, blunt, angry or inappropriate.        Speech: Speech normal.        Behavior: Behavior normal.        Thought Content: Thought content normal. Thought content does not include homicidal or suicidal ideation. Thought content does not include homicidal or suicidal plan.        Judgment: Judgment normal.     Comments: Insight intact. No auditory or visual hallucinations. No delusions.      Lab Review:     Component Value Date/Time   NA 134 (L) 10/18/2016 1830   K 7.1 (HH) 10/18/2016 1830   CL 109 10/18/2016 1830   CO2 29 08/04/2013 1100   GLUCOSE 103 (H) 10/18/2016 1830   BUN 21 (H)  10/18/2016 1830   CREATININE 0.90 10/18/2016 1830   CREATININE 0.87 08/04/2013 1100   Hamilton 9.1 08/04/2013 1100   PROT 6.4 08/04/2013 1100   ALBUMIN 4.0 08/04/2013 1100   AST 16 08/04/2013 1100   ALT 16 08/04/2013 1100   ALKPHOS 56 08/04/2013 1100   BILITOT 0.4 08/04/2013 1100       Component Value Date/Time   WBC 8.3 12/10/2013 1426   RBC 4.73 12/10/2013 1426   HGB 14.3 10/18/2016 1830   HGB 13.8 07/30/2013 1333   HCT 42.0 10/18/2016 1830   PLT 239 12/10/2013 1426   MCV 87.5 12/10/2013 1426   MCH 29.8 12/10/2013 1426   MCHC 34.1 12/10/2013 1426   RDW 13.8 12/10/2013 1426   LYMPHSABS 2.4 12/10/2013 1426   MONOABS 0.6 12/10/2013 1426   EOSABS 0.2 12/10/2013 1426   BASOSABS 0.0 12/10/2013 1426    No results found for: POCLITH, LITHIUM   No results found for: PHENYTOIN, PHENOBARB, VALPROATE, CBMZ   .res Assessment: Plan:    Marisa Hamilton was seen today for follow-up.  Diagnoses and all orders for this visit:  Major depression, recurrent, full remission ([place]) -     lamoTRIgine (LAMICTAL) 100 MG tablet; Take 1 tablet (100 mg total) by mouth daily. -     sertraline (ZOLOFT) 25 MG tablet; Take 1.5 tablets (37.5 mg total) by mouth daily.  Generalized anxiety disorder -     sertraline (ZOLOFT) 25 MG tablet; Take 1.5 tablets (37.5 mg total) by mouth daily.  Insomnia due to other mental disorder -     clonazePAM (KLONOPIN) 1 MG tablet; Take 1 tablet (1 mg total) by mouth  at bedtime. -     traZODone (DESYREL) 100 MG tablet; Take 1 tablet (100 mg total) by mouth at bedtime.   Please see After Visit Summary for patient specific instructions.  Greater than 50% of face to face time with patient was spent on counseling and coordination of care. 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  Most of the improvement has come through therapy and control of the headaches with some benefit from the medication as well particularly the  sertraline.  Sertraline is also help with her anxiety.  The benefits of the meds for sleep are clear as we tried to discontinue clonazepam and she had worsening of not only sleep but also her other psychiatric symptoms.  The clonazepam is medically necessary.  She is failed multiple other sleep medications.  She is aware of sleep hygiene issues in detail.  She understands of the risk of long-term cognitive problems related to it.  She accepts those risks.  He is tolerating meds well.  She does not want any medication changes.  No med changes  FU 6 mos  Lynder Parents MD, DFAPA  Future Appointments  Date Time Provider [place]  02/06/2019  3:30 PM [facility] DX DEXA 1 [facility]  11/21/2019  1:00 PM Regina Eck, CNM [place] None    No orders of the defined types were placed in this encounter.     -------------------------------

## 2019-02-06 ENCOUNTER — Other Ambulatory Visit: Payer: Self-pay

## 2019-02-06 ENCOUNTER — Ambulatory Visit
Admission: RE | Admit: 2019-02-06 | Discharge: 2019-02-06 | Disposition: A | Discharge status: Home / Self Care | Payer: BC Managed Care – PPO | Source: Ambulatory Visit | Attending: Certified Nurse Midwife | Admitting: Certified Nurse Midwife

## 2019-02-06 DIAGNOSIS — N951 Menopausal and female climacteric states: Secondary | ICD-10-CM

## 2019-02-07 ENCOUNTER — Telehealth: Payer: Self-pay

## 2019-02-07 NOTE — Telephone Encounter (Signed)
-----   Message from Regina Eck, CNM sent at 02/06/2019  4:48 PM EDT ----- Notify patient her bone density showed low bone mass in right hip. Spine is normal. Make sure to have 4 servings of calcium in diet or 600 mg of calcium citrate twice daily with vitamin D for enhanced absorption. Regular weight bearing exercise. Repeat in 2 years

## 2019-02-07 NOTE — Telephone Encounter (Signed)
Patient notified of results as written by provider 

## 2019-02-07 NOTE — Telephone Encounter (Signed)
Left message for call back.

## 2019-02-07 NOTE — Telephone Encounter (Signed)
Patient is returning call to [facility].

## 2019-03-11 ENCOUNTER — Ambulatory Visit (INDEPENDENT_AMBULATORY_CARE_PROVIDER_SITE_OTHER): Payer: BC Managed Care – PPO

## 2019-03-11 ENCOUNTER — Ambulatory Visit: Payer: BC Managed Care – PPO | Admitting: Sports Medicine

## 2019-03-11 ENCOUNTER — Other Ambulatory Visit: Payer: Self-pay

## 2019-03-11 ENCOUNTER — Encounter: Payer: Self-pay | Admitting: Sports Medicine

## 2019-03-11 DIAGNOSIS — M2022 Hallux rigidus, left foot: Secondary | ICD-10-CM

## 2019-03-11 DIAGNOSIS — M79672 Pain in left foot: Secondary | ICD-10-CM

## 2019-03-11 DIAGNOSIS — M778 Other enthesopathies, not elsewhere classified: Secondary | ICD-10-CM | POA: Diagnosis not present

## 2019-03-11 DIAGNOSIS — M79671 Pain in right foot: Secondary | ICD-10-CM

## 2019-03-11 DIAGNOSIS — M2011 Hallux valgus (acquired), right foot: Secondary | ICD-10-CM | POA: Diagnosis not present

## 2019-03-11 DIAGNOSIS — M67472 Ganglion, left ankle and foot: Secondary | ICD-10-CM | POA: Diagnosis not present

## 2019-03-11 DIAGNOSIS — M2021 Hallux rigidus, right foot: Secondary | ICD-10-CM | POA: Diagnosis not present

## 2019-03-11 DIAGNOSIS — M2012 Hallux valgus (acquired), left foot: Secondary | ICD-10-CM | POA: Diagnosis not present

## 2019-03-11 MED ORDER — TRIAMCINOLONE ACETONIDE 10 MG/ML IJ SUSP
10.0000 mg | Freq: Once | INTRAMUSCULAR | Status: AC
  Administered 2019-03-11: 10 mg

## 2019-03-11 NOTE — Patient Instructions (Signed)
Bunion  A bunion is a bump on the base of the big toe that forms when the bones of the big toe joint move out of position. Bunions may be small at first, but they often get larger over time. They can make walking painful. What are the causes? A bunion may be caused by:  Wearing narrow or pointed shoes that force the big toe to press against the other toes.  Abnormal foot development that causes the foot to roll inward (pronate).  Changes in the foot that are caused by certain diseases, such as rheumatoid arthritis or polio.  A foot injury. What increases the risk? The following factors may make you more likely to develop this condition:  Wearing shoes that squeeze the toes together.  Having certain diseases, such as: ? Rheumatoid arthritis. ? Polio. ? Cerebral palsy.  Having family members who have bunions.  Being born with a foot deformity, such as flat feet or low arches.  Doing activities that put a lot of pressure on the feet, such as ballet dancing. What are the signs or symptoms? The main symptom of a bunion is a noticeable bump on the big toe. Other symptoms may include:  Pain.  Swelling around the big toe.  Redness and inflammation.  Thick or hardened skin on the big toe or between the toes.  Stiffness or loss of motion in the big toe.  Trouble with walking. How is this diagnosed? A bunion may be diagnosed based on your symptoms, medical history, and activities. You may have tests, such as:  X-rays. These allow your health care provider to check the position of the bones in your foot and look for damage to your joint. They also help your health care provider determine the severity of your bunion and the best way to treat it.  Joint aspiration. In this test, a sample of fluid is removed from the toe joint. This test may be done if you are in a lot of pain. It helps rule out diseases that cause painful swelling of the joints, such as arthritis. How is this  treated? Treatment depends on the severity of your symptoms. The goal of treatment is to relieve symptoms and prevent the bunion from getting worse. Your health care provider may recommend:  Wearing shoes that have a wide toe box.  Using bunion pads to cushion the affected area.  Taping your toes together to keep them in a normal position.  Placing a device inside your shoe (orthotics) to help reduce pressure on your toe joint.  Taking medicine to ease pain, inflammation, and swelling.  Applying heat or ice to the affected area.  Doing stretching exercises.  Surgery to remove scar tissue and move the toes back into their normal position. This treatment is rare. Follow these instructions at home: Managing pain, stiffness, and swelling   If directed, put ice on the painful area: ? Put ice in a plastic bag. ? Place a towel between your skin and the bag. ? Leave the ice on for 20 minutes, 2-3 times a day. Activity   If directed, apply heat to the affected area before you exercise. Use the heat source that your health care provider recommends, such as a moist heat pack or a heating pad. ? Place a towel between your skin and the heat source. ? Leave the heat on for 20-30 minutes. ? Remove the heat if your skin turns bright red. This is especially important if you are unable to feel pain,   heat, or cold. You may have a greater risk of getting burned.  Do exercises as told by your health care provider. General instructions  Support your toe joint with proper footwear, shoe padding, or taping as told by your health care provider.  Take over-the-counter and prescription medicines only as told by your health care provider.  Keep all follow-up visits as told by your health care provider. This is important. Contact a health care provider if your symptoms:  Get worse.  Do not improve in 2 weeks. Get help right away if you have:  Severe pain and trouble with walking. Summary  A  bunion is a bump on the base of the big toe that forms when the bones of the big toe joint move out of position.  Bunions can make walking painful.  Treatment depends on the severity of your symptoms.  Support your toe joint with proper footwear, shoe padding, or taping as told by your health care provider. This information is not intended to replace advice given to you by your health care provider. Make sure you discuss any questions you have with your health care provider. Document Released: 05/01/2005 Document Revised: 11/05/2017 Document Reviewed: 09/11/2017 Elsevier Patient Education  2020 [employer]  

## 2019-03-11 NOTE — Progress Notes (Signed)
Subjective: Airis Avina is a 58 y.o. female patient who presents to office for evaluation of R>L foot pain. Patient complains of progressive pain especially over the last 2 years in the R>L foot at the big toe joint. Ranks pain 4-5/10 and is now interferring with daily activities. Patient has tried changing shoes with no relief in symptoms. Patient also admits to noticing swelling and a bump over the left 2nd toe that appeared all of a sudden since 4 months ago, has not changed in size, denies warmth, redness, or swelling, denies any other pedal complaints. Denies injury/trip/fall/sprain/any causative factors.   Review of Systems  All other systems reviewed and are negative.   Patient Active Problem List   Diagnosis Date Noted  . GAD (generalized anxiety disorder) 03/02/2018  . PTSD (post-traumatic stress disorder) 03/02/2018  . Chronic venous insufficiency 10/28/2013  . Varicose veins of bilateral lower extremities with other complications [id number]  . Chronic migraine without aura 04/22/2012  . 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 PERSISTENT DISORDER INITIATING/MAINTAINING SLEEP 10/06/2009  . INADEQUATE SLEEP HYGIENE 10/06/2009  . DEPRESSION 10/05/2009  . ALLERGIC RHINITIS 10/05/2009  . 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 MIGRAINES, HX OF 10/05/2009    Current Outpatient Medications on File Prior to Visit  Medication Sig Dispense Refill  . AIMOVIG 140 MG/ML SOAJ Inject 1 mL (140 mg dose) into the skin every 30 (thirty) days.  5  . baclofen (LIORESAL) 10 MG tablet Take 10 mg by mouth 3 (three) times daily.    [name] CALCIUM PO Take by mouth daily.    . Cetirizine HCl (ZYRTEC ALLERGY PO) Take 10 mg by mouth daily.    . chlorproMAZINE (THORAZINE) 25 MG tablet Take 1 tablet by mouth as needed.    . Cholecalciferol (VITAMIN D PO) Take 7,500 Int'l Units by mouth daily.     . clonazePAM (KLONOPIN) 1 MG tablet Take 1 tablet (1 mg total) by mouth at bedtime. 90 tablet 1  .  Cyanocobalamin (B-12 PO) Take 5,000 mcg by mouth.    . cyclobenzaprine (FLEXERIL) 5 MG tablet Take 5 mg by mouth at bedtime as needed for muscle spasms (take 1-2 tabs as needed).    [name] Calcium (STOOL SOFTENER PO) Take by mouth at bedtime.    . Famotidine (PEPCID PO) Take by mouth. Once or twice daily    . Ketorolac Tromethamine (TORADOL IJ) Inject as directed as needed.    . lamoTRIgine (LAMICTAL) 100 MG tablet Take 1 tablet (100 mg total) by mouth daily. 90 tablet 1  . MAGNESIUM PO Take 1,350 mg by mouth. 3 pills at night    . Multiple Vitamins-Minerals (CENTRUM SILVER PO) Take 1 tablet by mouth daily.    [name] (SYMPROIC) 0.2 MG TABS Take 1 tablet by mouth daily.    . NURTEC 75 MG TBDP TAKE 1 TABLET BY MOUTH AT ONSET OF FOR MIGRAINE    . omeprazole (PRILOSEC) 40 MG capsule TAKE 1 CAPSULE BY MOUTH 20 minutes BEFORE breakfast every DAY    . ondansetron (ZOFRAN) 8 MG tablet Take by mouth.    . polyethylene glycol powder (GLYCOLAX/MIRALAX) powder Take by mouth daily.    . Probiotic Product (PROBIOTIC PO) Take by mouth daily.    . promethazine (PHENERGAN) 25 MG tablet Take by mouth as needed.     . sertraline (ZOLOFT) 25 MG tablet Take 1.5 tablets (37.5 mg total) by mouth daily. 135 tablet 1  . tiZANidine (ZANAFLEX) 4 MG tablet Take  1 tablet by mouth as needed.    . traZODone (DESYREL) 100 MG tablet Take 1 tablet (100 mg total) by mouth at bedtime. 90 tablet 1  . VITAMIN K PO Take 150 mcg by mouth.     No current facility-administered medications on file prior to visit.     Allergies  Allergen Reactions  . Nsaids Other (See Comments)    Other reaction(s): Abdominal Pain Ulcerative colitis  . Phentermine     Pt stated, "Made my IC flare up"    Objective:  General: Alert and oriented x3 in no acute distress  Dermatology: No open lesions bilateral lower extremities, no webspace macerations, no ecchymosis bilateral, all nails x 10 are well manicured, + cyst at 2nd  toe on left.  Vascular: Dorsalis Pedis and Posterior Tibial pedal pulses palpable, Capillary Fill Time 3 seconds,(+) pedal hair growth bilateral, no edema bilateral lower extremities, Temperature gradient within normal limits.  Neurology: Johney Maine sensation intact via light touch bilateral.   Musculoskeletal: Mild tenderness with palpation at Right>left foot at 1st MTPJ, No pain to left 2nd toe,  Strength within normal limits in all groups bilateral.   Gait: Antalgic gait  Xrays  Right and Left Foot   Impression:1st mtpj narrowing with dorsal osteophytes, mild soft tissue swelling, no fracture, long 2nd metatarsal, no other acute findings.  Assessment and Plan: Problem List Items Addressed This Visit    None    Visit Diagnoses    Hallux rigidus of both feet    -  Primary   Relevant Orders   DG Foot Complete Right (Completed)   DG Foot Complete Left (Completed)   Capsulitis of foot, unspecified laterality       Digital mucous cyst of toe of left foot       Foot pain, bilateral          -Complete examination performed -Xrays reviewed -Discussed treatment options -After oral consent and aseptic prep, injected a mixture containing 1 ml of 2%  plain lidocaine, 1 ml 0.5% plain marcaine, 0.5 ml of kenalog 10 and 0.5 ml of dexamethasone phosphate into Right 1st MTPJ without complication. Post-injection care discussed with patient. -Dispensed bilateral bunion sleeve and recommend coban to left 2nd toe for cyst at night for compression to see if this will help to resolve it -Recommend topical pain rub as needed -Recommend arthritic panel if not better and possible aspiration of cyst if not better at next visit -Patient to return to office in 6 weeks or sooner if condition worsens.  Landis Martins, DPM

## 2019-04-22 ENCOUNTER — Other Ambulatory Visit: Payer: Self-pay

## 2019-04-22 ENCOUNTER — Ambulatory Visit: Payer: BC Managed Care – PPO | Admitting: Sports Medicine

## 2019-04-22 ENCOUNTER — Encounter: Payer: Self-pay | Admitting: Sports Medicine

## 2019-04-22 DIAGNOSIS — M79674 Pain in right toe(s): Secondary | ICD-10-CM

## 2019-04-22 DIAGNOSIS — M79675 Pain in left toe(s): Secondary | ICD-10-CM

## 2019-04-22 DIAGNOSIS — B351 Tinea unguium: Secondary | ICD-10-CM

## 2019-04-22 NOTE — Progress Notes (Signed)
Subjective: Marisa Hamilton is a 58 y.o. female patient seen today in office with complaint of mildly painful thickened and discolored 1st toe nails. Patient is desiring treatment for nail changes; has tried OTC topicals/Medication in the past with no improvement. Reports that nails are becoming difficult to manage because of the thickness. Patient reports that her right big toe joint feels better after injection and her left toe cyst seems about the same but not as painful as before when she wraps it.  Patient has no other pedal complaints at this time.   Patient Active Problem List   Diagnosis Date Noted  . GAD (generalized anxiety disorder) 03/02/2018  . PTSD (post-traumatic stress disorder) 03/02/2018  . Chronic venous insufficiency 10/28/2013  . Varicose veins of bilateral lower extremities with other complications [id number]  . Chronic migraine without aura 04/22/2012  . 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 PERSISTENT DISORDER INITIATING/MAINTAINING SLEEP 10/06/2009  . INADEQUATE SLEEP HYGIENE 10/06/2009  . DEPRESSION 10/05/2009  . ALLERGIC RHINITIS 10/05/2009  . 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 MIGRAINES, HX OF 10/05/2009    Current Outpatient Medications on File Prior to Visit  Medication Sig Dispense Refill  . AIMOVIG 140 MG/ML SOAJ Inject 1 mL (140 mg dose) into the skin every 30 (thirty) days.  5  . baclofen (LIORESAL) 10 MG tablet Take 10 mg by mouth 3 (three) times daily.    [name] CALCIUM PO Take by mouth daily.    . Cetirizine HCl (ZYRTEC ALLERGY PO) Take 10 mg by mouth daily.    . chlorproMAZINE (THORAZINE) 25 MG tablet Take 1 tablet by mouth as needed.    . Cholecalciferol (VITAMIN D PO) Take 7,500 Int'l Units by mouth daily.     . clonazePAM (KLONOPIN) 1 MG tablet Take 1 tablet (1 mg total) by mouth at bedtime. 90 tablet 1  . Cyanocobalamin (B-12 PO) Take 5,000 mcg by mouth.    . cyclobenzaprine (FLEXERIL) 5 MG tablet Take 5 mg by mouth at bedtime as needed  for muscle spasms (take 1-2 tabs as needed).    [name] Calcium (STOOL SOFTENER PO) Take by mouth at bedtime.    . Famotidine (PEPCID PO) Take by mouth. Once or twice daily    . Ketorolac Tromethamine (TORADOL IJ) Inject as directed as needed.    . lamoTRIgine (LAMICTAL) 100 MG tablet Take 1 tablet (100 mg total) by mouth daily. 90 tablet 1  . MAGNESIUM PO Take 1,350 mg by mouth. 3 pills at night    . Multiple Vitamins-Minerals (CENTRUM SILVER PO) Take 1 tablet by mouth daily.    [name] (SYMPROIC) 0.2 MG TABS Take 1 tablet by mouth daily.    . NURTEC 75 MG TBDP TAKE 1 TABLET BY MOUTH AT ONSET OF FOR MIGRAINE    . omeprazole (PRILOSEC) 40 MG capsule TAKE 1 CAPSULE BY MOUTH 20 minutes BEFORE breakfast every DAY    . ondansetron (ZOFRAN) 8 MG tablet Take by mouth.    . polyethylene glycol powder (GLYCOLAX/MIRALAX) powder Take by mouth daily.    . Probiotic Product (PROBIOTIC PO) Take by mouth daily.    . promethazine (PHENERGAN) 25 MG tablet Take by mouth as needed.     . sertraline (ZOLOFT) 25 MG tablet Take 1.5 tablets (37.5 mg total) by mouth daily. 135 tablet 1  . tiZANidine (ZANAFLEX) 4 MG tablet Take 1 tablet by mouth as needed.    . traZODone (DESYREL) 100 MG tablet Take 1 tablet (100 mg total)  by mouth at bedtime. 90 tablet 1  . VITAMIN K PO Take 150 mcg by mouth.     No current facility-administered medications on file prior to visit.     Allergies  Allergen Reactions  . Nsaids Other (See Comments)    Other reaction(s): Abdominal Pain Ulcerative colitis  . Phentermine     Pt stated, "Made my IC flare up"    Objective: Physical Exam  General: Well developed, nourished, no acute distress, awake, alert and oriented x 3  Vascular: Dorsalis pedis artery 2/4 bilateral, Posterior tibial artery 2/4 bilateral, skin temperature warm to warm proximal to distal bilateral lower extremities, no varicosities, pedal hair present bilateral.  Neurological: Gross sensation  present via light touch bilateral.   Dermatological: Skin is warm, dry, and supple bilateral, Nails 1-10 are tender, short thick, and discolored with mild subungal debris with bilateral hallux nails most involved, small cyst at interphalangeal joint of left second toe with no signs of infection, no webspace macerations present bilateral, no open lesions present bilateral, no callus/corns/hyperkeratotic tissue present bilateral. No signs of infection bilateral.  Musculoskeletal: Limited first metatarsophalangeal joint range of motion with dorsal bone spur consistent with hallux rigidus bilateral with no symptoms however there is significant limitation on end range of dorsiflexion right greater than left. Assessment and Plan:  Problem List Items Addressed This Visit    None    Visit Diagnoses    Nail fungus    -  Primary   Relevant Orders   Culture, fungus without smear   Toe pain, bilateral          -Examined patient -Discussed long-term care for hallux rigidus and cyst at this time we will continue to monitor since patient symptoms have stabilized or much improved since injection on right -Discussed treatment options for painful dystrophic nails  -Fungal culture was obtained by removing a portion of the hard nail itself from each of the involved toenails using a sterile nail nipper and sent to [facility] lab. Patient tolerated the biopsy procedure well without discomfort or need for anesthesia.  -Patient to return in 4 weeks for follow up evaluation and discussion of fungal culture results or sooner if symptoms worsen.  Landis Martins, DPM

## 2019-06-03 ENCOUNTER — Other Ambulatory Visit: Payer: Self-pay

## 2019-06-03 DIAGNOSIS — F5105 Insomnia due to other mental disorder: Secondary | ICD-10-CM

## 2019-06-03 DIAGNOSIS — F3342 Major depressive disorder, recurrent, in full remission: Secondary | ICD-10-CM

## 2019-06-03 DIAGNOSIS — F99 Mental disorder, not otherwise specified: Secondary | ICD-10-CM

## 2019-06-03 MED ORDER — CLONAZEPAM 1 MG PO TABS: 1.0000 mg | ORAL_TABLET | Freq: Every day | ORAL | 0 refills | Status: DC

## 2019-06-03 MED ORDER — LAMOTRIGINE 100 MG PO TABS: 100.0000 mg | ORAL_TABLET | Freq: Every day | ORAL | 0 refills | Status: DC

## 2019-06-10 ENCOUNTER — Ambulatory Visit (INDEPENDENT_AMBULATORY_CARE_PROVIDER_SITE_OTHER): Payer: BC Managed Care – PPO | Admitting: Family Medicine

## 2019-06-10 ENCOUNTER — Other Ambulatory Visit: Payer: Self-pay

## 2019-06-10 ENCOUNTER — Encounter (INDEPENDENT_AMBULATORY_CARE_PROVIDER_SITE_OTHER): Payer: Self-pay | Admitting: Family Medicine

## 2019-06-10 VITALS — BP 93/64 | HR 72 | Temp 98.3°F | Ht 65.0 in | Wt 193.0 lb

## 2019-06-10 DIAGNOSIS — E559 Vitamin D deficiency, unspecified: Secondary | ICD-10-CM

## 2019-06-10 DIAGNOSIS — R0602 Shortness of breath: Secondary | ICD-10-CM

## 2019-06-10 DIAGNOSIS — K9041 Non-celiac gluten sensitivity: Secondary | ICD-10-CM

## 2019-06-10 DIAGNOSIS — R739 Hyperglycemia, unspecified: Secondary | ICD-10-CM

## 2019-06-10 DIAGNOSIS — R5383 Other fatigue: Secondary | ICD-10-CM

## 2019-06-10 DIAGNOSIS — Z6832 Body mass index (BMI) 32.0-32.9, adult: Secondary | ICD-10-CM

## 2019-06-10 DIAGNOSIS — E739 Lactose intolerance, unspecified: Secondary | ICD-10-CM

## 2019-06-10 DIAGNOSIS — E669 Obesity, unspecified: Secondary | ICD-10-CM

## 2019-06-10 DIAGNOSIS — Z1331 Encounter for screening for depression: Secondary | ICD-10-CM

## 2019-06-10 DIAGNOSIS — Z9189 Other specified personal risk factors, not elsewhere classified: Secondary | ICD-10-CM

## 2019-06-10 DIAGNOSIS — G8929 Other chronic pain: Secondary | ICD-10-CM | POA: Diagnosis not present

## 2019-06-10 DIAGNOSIS — Z0289 Encounter for other administrative examinations: Secondary | ICD-10-CM

## 2019-06-10 DIAGNOSIS — K5909 Other constipation: Secondary | ICD-10-CM

## 2019-06-11 LAB — COMPREHENSIVE METABOLIC PANEL
ALT: 22 IU/L (ref 0–32)
AST: 26 IU/L (ref 0–40)
Albumin/Globulin Ratio: 1.7 (ref 1.2–2.2)
Albumin: 4.7 g/dL (ref 3.8–4.9)
Alkaline Phosphatase: 72 IU/L (ref 39–117)
BUN/Creatinine Ratio: 15 (ref 12–28)
BUN: 15 mg/dL (ref 8–27)
Bilirubin Total: 0.2 mg/dL (ref 0.0–1.2)
CO2: 25 mmol/L (ref 20–29)
Calcium: 9.7 mg/dL (ref 8.7–10.3)
Chloride: 101 mmol/L (ref 96–106)
Creatinine, Ser: 1 mg/dL (ref 0.57–1.00)
GFR calc Af Amer: 71 mL/min/{1.73_m2} (ref >59)
GFR calc non Af Amer: 61 mL/min/{1.73_m2} (ref >59)
Globulin, Total: 2.8 g/dL (ref 1.5–4.5)
Glucose: 100 mg/dL — ABNORMAL HIGH (ref 65–99)
Potassium: 4.9 mmol/L (ref 3.5–5.2)
Sodium: 142 mmol/L (ref 134–144)
Total Protein: 7.5 g/dL (ref 6.0–8.5)

## 2019-06-11 LAB — HEMOGLOBIN A1C
Est. average glucose Bld gHb Est-mCnc: 117 mg/dL
Hgb A1c MFr Bld: 5.7 % — ABNORMAL HIGH (ref 4.8–5.6)

## 2019-06-11 LAB — CBC WITH DIFFERENTIAL/PLATELET
Basophils Absolute: 0.1 10*3/uL (ref 0.0–0.2)
Basos: 1 %
EOS (ABSOLUTE): 0.2 10*3/uL (ref 0.0–0.4)
Eos: 2 %
Hematocrit: 42.9 % (ref 34.0–46.6)
Hemoglobin: 14.5 g/dL (ref 11.1–15.9)
Immature Grans (Abs): 0 10*3/uL (ref 0.0–0.1)
Immature Granulocytes: 0 %
Lymphocytes Absolute: 2.2 10*3/uL (ref 0.7–3.1)
Lymphs: 24 %
MCH: 30.1 pg (ref 26.6–33.0)
MCHC: 33.8 g/dL (ref 31.5–35.7)
MCV: 89 fL (ref 79–97)
Monocytes Absolute: 0.6 10*3/uL (ref 0.1–0.9)
Monocytes: 6 %
Neutrophils Absolute: 6.2 10*3/uL (ref 1.4–7.0)
Neutrophils: 67 %
Platelets: 288 10*3/uL (ref 150–450)
RBC: 4.82 x10E6/uL (ref 3.77–5.28)
RDW: 13.2 % (ref 11.7–15.4)
WBC: 9.2 10*3/uL (ref 3.4–10.8)

## 2019-06-11 LAB — FOLATE: Folate: >20 ng/mL (ref >3.0)

## 2019-06-11 LAB — LIPID PANEL WITH LDL/HDL RATIO
Cholesterol, Total: 187 mg/dL (ref 100–199)
HDL: 68 mg/dL (ref >39)
LDL Chol Calc (NIH): 89 mg/dL (ref 0–99)
LDL/HDL Ratio: 1.3 ratio (ref 0.0–3.2)
Triglycerides: 181 mg/dL — ABNORMAL HIGH (ref 0–149)
VLDL Cholesterol Cal: 30 mg/dL (ref 5–40)

## 2019-06-11 LAB — T3: T3, Total: 99 ng/dL (ref 71–180)

## 2019-06-11 LAB — INSULIN, RANDOM: INSULIN: 22.9 u[IU]/mL (ref 2.6–24.9)

## 2019-06-11 LAB — VITAMIN D 25 HYDROXY (VIT D DEFICIENCY, FRACTURES): Vit D, 25-Hydroxy: 80 ng/mL (ref 30.0–100.0)

## 2019-06-11 LAB — VITAMIN B12: Vitamin B-12: >2000 pg/mL — ABNORMAL HIGH (ref 232–1245)

## 2019-06-11 LAB — T4, FREE: Free T4: 0.97 ng/dL (ref 0.82–1.77)

## 2019-06-11 LAB — TSH: TSH: 2.55 u[IU]/mL (ref 0.450–4.500)

## 2019-06-11 NOTE — Progress Notes (Signed)
Dear [name], MD,   Thank you for referring Annison Birchard to our clinic. The following note includes my evaluation and treatment recommendations.  Chief Complaint:   OBESITY Marisa Hamilton (MR# [id number]) is a 59 y.o. female who presents for evaluation and treatment of obesity and related comorbidities. Current BMI is Body mass index is 32.12 kg/m. Marisa Hamilton has been struggling with her weight for many years and has been unsuccessful in either losing weight, maintaining weight loss, or reaching her healthy weight goal.  Marisa Hamilton is currently in the action stage of change and ready to dedicate time achieving and maintaining a healthier weight. Marisa Hamilton is interested in becoming our patient and working on intensive lifestyle modifications including (but not limited to) diet and exercise for weight loss.  Marisa Hamilton's habits were reviewed today and are as follows: Her family eats meals together, she thinks her family will eat healthier with her, her desired weight loss is 43 lbs, she started gaining weight with medications 15 years ago, her heaviest weight ever was 196 pounds, she has significant food cravings issues, she snacks frequently in the evenings, she is frequently drinking liquids with calories, she frequently makes poor food choices, she has problems with excessive hunger, she frequently eats larger portions than normal and she struggles with emotional eating.  Depression Screen Marisa Hamilton's Food and Mood (modified PHQ-9) score was 19.  Depression screen [facility] 2/9 06/10/2019  Decreased Interest 2  Down, Depressed, Hopeless 3  PHQ - 2 Score 5  Altered sleeping 3  Tired, decreased energy 3  Change in appetite 3  Feeling bad or failure about yourself  3  Trouble concentrating 1  Moving slowly or fidgety/restless 1  Suicidal thoughts 0  PHQ-9 Score 19  Difficult doing work/chores Somewhat difficult   Subjective:   1. Other fatigue Marisa Hamilton admits to daytime somnolence and admits to  waking up still tired. Patent has a history of symptoms of daytime fatigue and morning headache. Marisa Hamilton generally gets 4 or 6 hours of sleep per night, and states that she has difficulty falling asleep. Snoring is present. Apneic episodes are not present. Epworth Sleepiness Score is 5.  2. Shortness of breath on exertion Marisa Hamilton notes increasing shortness of breath with exercising and seems to be worsening over time with weight gain. She notes getting out of breath sooner with activity than she used to. This has not gotten worse recently. [name] denies shortness of breath at rest or orthopnea.  3. Other chronic pain Marisa Hamilton has multiple chronic pain issues in multiple body areas, as well as migraine headaches and chronic fatigue syndrome. She exercises 3-4 times per week with pilates and walking.  4. Vitamin D deficiency Marisa Hamilton is on Vit D and she has no recent labs. She notes fatigue.  5. Hyperglycemia Marisa Hamilton has a history of mildly elevated glucose levels in Epic. She notes polyphagia.  6. Other constipation Marisa Hamilton has constipation with predominant irritable bowel syndrome. She is followed by GI. She states it is well controlled at this point with medications.  7. Lactose intolerance Marisa Hamilton cannot tolerate moderate to large amounts of milk products.  8. Gluten intolerance Marisa Hamilton states gluten upsets her stomach and can trigger irritable bowel syndrome.  9. At risk for heart disease Marisa Hamilton is at a higher than average risk for cardiovascular disease due to obesity. Reviewed: no chest pain on exertion, no dyspnea on exertion, and no swelling of ankles.  Assessment/Plan:   1. Other fatigue Marisa Hamilton does feel that  her weight is causing her energy to be lower than it should be. Fatigue may be related to obesity, depression or many other causes. Labs will be ordered, and in the meanwhile, Marisa Hamilton will focus on self care including making healthy food choices, increasing physical activity and focusing on stress  reduction.  - EKG 12-Lead - Vitamin B12 - CBC with Differential/Platelet - Folate - T3 - T4, free - TSH  2. Shortness of breath on exertion Marisa Hamilton does feel that she gets out of breath more easily that she used to when she exercises. Marisa Hamilton's shortness of breath appears to be obesity related and exercise induced. She has agreed to work on weight loss and gradually increase exercise to treat her exercise induced shortness of breath. Will continue to monitor closely.  - Lipid Panel With LDL/HDL Ratio  3. Other chronic pain Marisa Hamilton will continue to work on weight loss and diet to decrease inflammation.  4. Vitamin D deficiency Low Vitamin D level contributes to fatigue and are associated with obesity, breast, and colon cancer. We will check labs today and Marisa Hamilton will follow-up for routine testing of Vitamin D, at least 2-3 times per year to avoid over-replacement.  - VITAMIN D 25 Hydroxy (Vit-D Deficiency, Fractures)  5. Hyperglycemia Fasting labs will be obtained today and results with be discussed with Marisa Hamilton Pod in 2 weeks at her follow up visit. In the meanwhile Marisa Hamilton was started on a lower simple carbohydrate diet and will work on weight loss efforts.  - Comprehensive metabolic panel - Hemoglobin A1c - Insulin, random  6. Other constipation Marisa Hamilton was informed that a decrease in bowel movement frequency is normal while losing weight, but stools should not be hard or painful. We will continue to monitor and make sure the patient is eating enough high fiber foods. Orders and follow up as documented in patient record.   Counseling Getting to Good Bowel Health: Your goal is to have one soft bowel movement each day. Drink at least 8 glasses of water each day. Eat plenty of fiber (goal is over 25 grams each day). It is best to get most of your fiber from dietary sources which includes leafy green vegetables, fresh fruit, and whole grains. You may need to add fiber with the help of OTC fiber  supplements. These include Metamucil, Citrucel, and Flaxseed. If you are still having trouble, try adding Miralax or Magnesium Citrate. If all of these changes do not work, Cabin crew.  7. Lactose intolerance Marketia will minimize lactose rich foods, options were discussed today.  8. Gluten intolerance Jahmiya will avoid high gluten foods.  9. Depression screening Shakeitha had a positive depression screening. Depression is commonly associated with obesity and often results in emotional eating behaviors. We will monitor this closely and work on CBT to help improve the non-hunger eating patterns. Referral to Psychology may be required if no improvement is seen as she continues in our clinic.  10. At risk for heart disease Lawson was given approximately 30 minutes of coronary artery disease prevention counseling today. She is 59 y.o. female and has risk factors for heart disease including obesity. We discussed intensive lifestyle modifications today with an emphasis on specific weight loss instructions and strategies.   Repetitive spaced learning was employed today to elicit superior memory formation and behavioral change.  11. Class 1 obesity with serious comorbidity and body mass index (BMI) of 32.0 to 32.9 in adult, unspecified obesity type Jazzlin is currently in the action stage of change  and her goal is to continue with weight loss efforts. I recommend Maite begin the structured treatment plan as follows:  She has agreed to the Category 2 Plan.  Exercise goals: No exercise has been prescribed at this time.   Behavioral modification strategies: increasing lean protein intake and meal planning and cooking strategies.  She was informed of the importance of frequent follow-up visits to maximize her success with intensive lifestyle modifications for her multiple health conditions. She was informed we would discuss her lab results at her next visit unless there is a critical issue that needs  to be addressed sooner. Georga agreed to keep her next visit at the agreed upon time to discuss these results.  Objective:   Blood pressure 93/64, pulse 72, temperature 98.3 F (36.8 C), temperature source Oral, height 5' 5"  (1.651 m), weight 193 lb (87.5 kg), last menstrual period 03/10/2013, SpO2 98 %. Body mass index is 32.12 kg/m.  EKG: Normal sinus rhythm, rate 71 BPM.  Indirect Calorimeter completed today shows a VO2 of 210 and a REE of 1463.  Her calculated basal metabolic rate is 0102 thus her basal metabolic rate is worse than expected.  General: Cooperative, alert, well developed, in no acute distress. HEENT: Conjunctivae and lids unremarkable. Cardiovascular: Regular rhythm.  Lungs: Normal work of breathing. Neurologic: No focal deficits.   Lab Results  Component Value Date   CREATININE 1.00 06/10/2019   BUN 15 06/10/2019   NA 142 06/10/2019   K 4.9 06/10/2019   CL 101 06/10/2019   CO2 25 06/10/2019   Lab Results  Component Value Date   ALT 22 06/10/2019   AST 26 06/10/2019   ALKPHOS 72 06/10/2019   BILITOT 0.2 06/10/2019   Lab Results  Component Value Date   HGBA1C 5.7 (H) 06/10/2019   HGBA1C 5.6 08/04/2013   Lab Results  Component Value Date   INSULIN 22.9 06/10/2019   Lab Results  Component Value Date   TSH 2.550 06/10/2019   Lab Results  Component Value Date   CHOL 187 06/10/2019   HDL 68 06/10/2019   LDLCALC 89 06/10/2019   TRIG 181 (H) 06/10/2019   CHOLHDL 2.7 08/04/2013   Lab Results  Component Value Date   WBC 9.2 06/10/2019   HGB 14.5 06/10/2019   HCT 42.9 06/10/2019   MCV 89 06/10/2019   PLT 288 06/10/2019   No results found for: IRON, TIBC, FERRITIN  Attestation Statements:   Reviewed by clinician on day of visit: allergies, medications, problem list, medical history, surgical history, family history, social history, and previous encounter notes.    I, Trixie Dredge, am acting as transcriptionist for Dennard Nip, MD.  I  have reviewed the above documentation for accuracy and completeness, and I agree with the above. - Dennard Nip, MD

## 2019-06-23 ENCOUNTER — Other Ambulatory Visit: Payer: Self-pay

## 2019-06-23 ENCOUNTER — Ambulatory Visit (INDEPENDENT_AMBULATORY_CARE_PROVIDER_SITE_OTHER): Payer: BC Managed Care – PPO | Admitting: Psychiatry

## 2019-06-23 ENCOUNTER — Encounter: Payer: Self-pay | Admitting: Psychiatry

## 2019-06-23 DIAGNOSIS — F99 Mental disorder, not otherwise specified: Secondary | ICD-10-CM

## 2019-06-23 DIAGNOSIS — F3342 Major depressive disorder, recurrent, in full remission: Secondary | ICD-10-CM | POA: Diagnosis not present

## 2019-06-23 DIAGNOSIS — F5105 Insomnia due to other mental disorder: Secondary | ICD-10-CM

## 2019-06-23 DIAGNOSIS — F331 Major depressive disorder, recurrent, moderate: Secondary | ICD-10-CM

## 2019-06-23 DIAGNOSIS — F411 Generalized anxiety disorder: Secondary | ICD-10-CM | POA: Diagnosis not present

## 2019-06-23 MED ORDER — TRAZODONE HCL 100 MG PO TABS: 100.0000 mg | ORAL_TABLET | Freq: Every day | ORAL | 1 refills | Status: DC

## 2019-06-23 MED ORDER — SERTRALINE HCL 25 MG PO TABS: 25.0000 mg | ORAL_TABLET | Freq: Two times a day (BID) | ORAL | 0 refills | Status: DC

## 2019-06-23 MED ORDER — CLONAZEPAM 1 MG PO TABS: 1.0000 mg | ORAL_TABLET | Freq: Every day | ORAL | 0 refills | Status: DC

## 2019-06-23 NOTE — Progress Notes (Signed)
Marisa Hamilton [id number] July 03, 1960 59 y.o.  Subjective:   Patient ID:  Marisa Hamilton is a 59 y.o. (DOB 20-Jul-1960) female.  Chief Complaint:  Chief Complaint  Patient presents with  . Follow-up    Medication Management  . Depression    Medication Management  . Anxiety    Depression        Associated symptoms include headaches.  Associated symptoms include no decreased concentration and no suicidal ideas.  Marisa Hamilton presents to the office today for follow-up of depression and anxiety  Last seen in August 2020.  No meds were changed. Remains on sertrline 37.5 mg, lamotrigine, clonazepam 26m HS.     Wonderful until Saturday.  [facility] [place] [facility]  Nervous about it and that she can't meds needed.    This weekend felt worse, on edge and anxious and irritable.  Anger outburst Saturday without reason.  Yesterday dysfunctional.  Also just started a diet as well 2 weeks ago.  Harder to do things. Still Overall feeling good and doesn't want change.  Cannot stop clonazepam and still sleep.  Sleep remained steady.  Control of HA has helped mood "all the difference in the world".  Better handling stressor of mother and in laws thourhg therapy and meds.  Patient reports stable mood and denies depressed or irritable moods.  Patient denies any recent difficulty with anxiety.  Patient denies difficulty with sleep initiation or maintenance.8 hours.  Denies appetite disturbance.  Patient reports that energy and motivation have been good.  Patient denies any difficulty with concentration.  Patient denies any suicidal ideation.  Failed psychiatric medication trials include Lexapro, lithium, amitriptyline, Emsam, duloxetine, Pristiq,  Wellbutrin, fluoxetine,  venlafaxine, valproic acid,buspirone, Abilify, clonidine, Topamax, Depakote, Deplin, carbamazepine.   Sleep med failures include amitriptyline, mirtazapine, trazodone, doxepin which caused nightmares,  Ambien, gabapentin, Sonata, cyclobenzaprine, Xanax.  Son OCD on Zoloft  Review of Systems:  Review of Systems  Neurological: Positive for headaches. Negative for dizziness, tremors and weakness.  Psychiatric/Behavioral: Positive for depression. Negative for agitation, behavioral problems, confusion, decreased concentration, dysphoric mood, hallucinations, self-injury, sleep disturbance and suicidal ideas. The patient is nervous/anxious. The patient is not hyperactive.     Medications: I have reviewed the patient's current medications.  Current Outpatient Medications  Medication Sig Dispense Refill  . AIMOVIG 140 MG/ML SOAJ Inject 1 mL (140 mg dose) into the skin every 30 (thirty) days.  5  . baclofen (LIORESAL) 10 MG tablet Take 10 mg by mouth 3 (three) times daily.    .[name] KitchenCALCIUM PO Take 600 mg by mouth 2 (two) times daily.     . chlorproMAZINE (THORAZINE) 25 MG tablet Take 1 tablet by mouth as needed.    . Cholecalciferol (VITAMIN D PO) Take 7,500 Int'l Units by mouth daily.     . clonazePAM (KLONOPIN) 1 MG tablet Take 1 tablet (1 mg total) by mouth at bedtime. 90 tablet 0  . Cyanocobalamin (B-12 PO) Take 5,000 mcg by mouth.    .Mariane BaumgartenCalcium (STOOL SOFTENER PO) Take by mouth at bedtime.    . Famotidine (PEPCID PO) Take by mouth. Once or twice daily    . FIBER ADULT GUMMIES PO Take 2 each by mouth daily.    .[name] KitchenKetorolac Tromethamine (TORADOL IJ) Inject as directed as needed.    . lamoTRIgine (LAMICTAL) 100 MG tablet Take 1 tablet (100 mg total) by mouth daily. 90 tablet 0  . Lasmiditan Succinate (REYVOW) 50 MG  TABS Take by mouth.    [name] MAGNESIUM PO Take 1,350 mg by mouth. 3 pills at night    . Multiple Vitamins-Minerals (CENTRUM SILVER PO) Take 1 tablet by mouth daily.    [name] (SYMPROIC) 0.2 MG TABS Take 1 tablet by mouth daily.    [name] omeprazole (PRILOSEC) 40 MG capsule TAKE 1 CAPSULE BY MOUTH 20 minutes BEFORE breakfast every DAY    . ondansetron (ZOFRAN) 8 MG  tablet Take by mouth.    . polyethylene glycol powder (GLYCOLAX/MIRALAX) powder Take by mouth daily.    . Probiotic Product (PROBIOTIC PO) Take by mouth daily.    . sertraline (ZOLOFT) 25 MG tablet Take 1 tablet (25 mg total) by mouth 2 (two) times daily. 180 tablet 0  . simethicone (MYLICON) 923 MG chewable tablet Chew 125 mg by mouth every 6 (six) hours as needed for flatulence.    [name] tiZANidine (ZANAFLEX) 4 MG tablet Take 1 tablet by mouth as needed.    . traZODone (DESYREL) 100 MG tablet Take 1 tablet (100 mg total) by mouth at bedtime. 90 tablet 1  . Ubrogepant (UBRELVY) 50 MG TABS Take by mouth.    [name] VITAMIN K PO Take 150 mcg by mouth.    [name] Eptinezumab-jjmr (VYEPTI) 100 MG/ML injection Inject into the vein.     No current facility-administered medications for this visit.    Medication Side Effects: None  Allergies:  Allergies  Allergen Reactions  . Nsaids Other (See Comments)    Other reaction(s): Abdominal Pain Ulcerative colitis  . Gluten Meal Other (See Comments)    Stomach upset  . Milk-Related Compounds Other (See Comments)    Stomach upset  . Phentermine     Pt stated, "Made my IC flare up"    Past Medical History:  Diagnosis Date  . Anxiety   . Back pain   . CFS (chronic fatigue syndrome)   . Concussion 03-17-17  . Constipation   . Cystitis, interstitial    HAD BLADDER SX FOR SAME  . Depression   . Fainting spell   . Fibromyalgia   . Food allergy   . Foot pain   . GERD (gastroesophageal reflux disease)   . Headache(784.0)   . IBS (irritable bowel syndrome)    TAKES PROBIOTICS  . Insomnia   . Lactose intolerance   . Leg pain   . Migraine   . MVP (mitral valve prolapse)   . Nausea   . Neck pain   . PONV (postoperative nausea and vomiting)   . Restless leg syndrome   . Stomach ache   . Ulcerative colitis ([place])   . Varicose veins     Family History  Problem Relation Age of Onset  . Osteoporosis Mother   . Atrial fibrillation Mother   . Heart  disease Mother   . Stroke Mother   . Depression Mother   . Anxiety disorder Mother   . Obesity Mother   . Diabetes Father   . Hypertension Father   . Depression Father   . Obesity Father   . Breast cancer Maternal Grandmother   . Cancer Maternal Grandmother        ovarian  . Diabetes Paternal Grandmother   . Migraines Brother        CHRONIC    Social History   Socioeconomic History  . Marital status: Married    Spouse name: Therapist, sports  . Number of children: Not on file  . Years of education:  Not on file  . Highest education level: Not on file  Occupational History  . Occupation: Retired Wellsite geologist  Tobacco Use  . Smoking status: Never Smoker  . Smokeless tobacco: Never Used  Substance and Sexual Activity  . Alcohol use: Yes    Alcohol/week: 1.0 standard drinks    Types: 1 Standard drinks or equivalent per week  . Drug use: No  . Sexual activity: Yes    Partners: Male    Birth control/protection: Other-see comments    Comment: husband vasectomy  Other Topics Concern  . Not on file  Social History Narrative  . Not on file   Social Determinants of Health   Financial Resource Strain:   . Difficulty of Paying Living Expenses: Not on file  Food Insecurity:   . Worried About Charity fundraiser in the Last Year: Not on file  . Ran Out of Food in the Last Year: Not on file  Transportation Needs:   . Lack of Transportation (Medical): Not on file  . Lack of Transportation (Non-Medical): Not on file  Physical Activity:   . Days of Exercise per Week: Not on file  . Minutes of Exercise per Session: Not on file  Stress:   . Feeling of Stress : Not on file  Social Connections:   . Frequency of Communication with Friends and Family: Not on file  . Frequency of Social Gatherings with Friends and Family: Not on file  . Attends Religious Services: Not on file  . Active Member of Clubs or Organizations: Not on file  . Attends Archivist Meetings: Not on  file  . Marital Status: Not on file  Intimate Partner Violence:   . Fear of Current or Ex-Partner: Not on file  . Emotionally Abused: Not on file  . Physically Abused: Not on file  . Sexually Abused: Not on file    Past Medical History, Surgical history, Social history, and Family history were reviewed and updated as appropriate.   Please see review of systems for further details on the patient's review from today.   Objective:   Physical Exam:  LMP 03/10/2013   Physical Exam Constitutional:      General: She is not in acute distress.    Appearance: She is well-developed.  Musculoskeletal:        General: No deformity.  Neurological:     Mental Status: She is alert and oriented to person, place, and time.     Motor: No atrophy.     Coordination: Coordination normal.     Gait: Gait normal.  Psychiatric:        Attention and Perception: She is attentive.        Mood and Affect: Mood is anxious and depressed. Affect is not labile, blunt, angry or inappropriate.        Speech: Speech normal.        Behavior: Behavior normal.        Thought Content: Thought content normal. Thought content does not include homicidal or suicidal ideation. Thought content does not include homicidal or suicidal plan.        Judgment: Judgment normal.     Comments: Insight intact. No auditory or visual hallucinations. No delusions.  Was fine until this weekend and has been more depressed anxious and irritable for a few days     Lab Review:     Component Value Date/Time   NA 142 06/10/2019 1248   K 4.9 06/10/2019 1248  CL 101 06/10/2019 1248   CO2 25 06/10/2019 1248   GLUCOSE 100 (H) 06/10/2019 1248   GLUCOSE 103 (H) 10/18/2016 1830   BUN 15 06/10/2019 1248   CREATININE 1.00 06/10/2019 1248   CREATININE 0.87 08/04/2013 1100   CALCIUM 9.7 06/10/2019 1248   PROT 7.5 06/10/2019 1248   ALBUMIN 4.7 06/10/2019 1248   AST 26 06/10/2019 1248   ALT 22 06/10/2019 1248   ALKPHOS 72 06/10/2019  1248   BILITOT 0.2 06/10/2019 1248   GFRNONAA 61 06/10/2019 1248   GFRAA 71 06/10/2019 1248       Component Value Date/Time   WBC 9.2 06/10/2019 1248   WBC 8.3 12/10/2013 1426   RBC 4.82 06/10/2019 1248   RBC 4.73 12/10/2013 1426   HGB 14.5 06/10/2019 1248   HGB 13.8 07/30/2013 1333   HCT 42.9 06/10/2019 1248   PLT 288 06/10/2019 1248   MCV 89 06/10/2019 1248   MCH 30.1 06/10/2019 1248   MCH 29.8 12/10/2013 1426   MCHC 33.8 06/10/2019 1248   MCHC 34.1 12/10/2013 1426   RDW 13.2 06/10/2019 1248   LYMPHSABS 2.2 06/10/2019 1248   MONOABS 0.6 12/10/2013 1426   EOSABS 0.2 06/10/2019 1248   BASOSABS 0.1 06/10/2019 1248    No results found for: POCLITH, LITHIUM   No results found for: PHENYTOIN, PHENOBARB, VALPROATE, CBMZ    06/10/19 Normal D 80, B12 >2000, normal TSH, CBC, CMP  .res Assessment: Plan:    Marisa Hamilton was seen today for follow-up, depression and anxiety.  Diagnoses and all orders for this visit:  Major depressive disorder, recurrent episode, moderate (HCC)  Generalized anxiety disorder -     sertraline (ZOLOFT) 25 MG tablet; Take 1 tablet (25 mg total) by mouth 2 (two) times daily.  Insomnia due to other mental disorder -     clonazePAM (KLONOPIN) 1 MG tablet; Take 1 tablet (1 mg total) by mouth at bedtime. -     traZODone (DESYREL) 100 MG tablet; Take 1 tablet (100 mg total) by mouth at bedtime.  Major depression, recurrent, full remission (HCC) -     sertraline (ZOLOFT) 25 MG tablet; Take 1 tablet (25 mg total) by mouth 2 (two) times daily.   Please see After Visit Summary for patient specific instructions.  Greater than 50% of face to face time with patient was spent on counseling and coordination of care. 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  Most of the improvement has come through therapy and control of the headaches with some benefit from the medication as well particularly the sertraline.   Sertraline is also help with her anxiety.  The benefits of the meds for sleep are clear as we tried to discontinue clonazepam and she had worsening of not only sleep but also her other psychiatric symptoms.  She is acutely worse this weekend which is probably related to fear about losing her headache treatments.  Her headache treatments have made such a dramatic effect on her quality of life she fears losing that.  Was given some reassurance that is likely the new doctor taking over her care will continue what has been working.  The other factor is starting a sugar-free diet and she recognizes she is having some withdrawal from sugar.  The clonazepam is medically necessary.  She is failed multiple other sleep medications.  She is aware of sleep hygiene issues in detail.  She understands of the risk of long-term cognitive problems related to  it.  She accepts those risks.  Newer studies dispute the risk of dementia.   sHe is tolerating meds well.  She does not want any medication changes.  06/10/19 Normal D 80, B12 >2000, normal TSH, CBC, CMP  No med changes until evaluates HA meds. Consider sertraline increase if needed.  Yes until irrritability is resolved which may be sugar withdrawal.  May reduce it back later.  Then at next visit try NAC for mild cognitive complaints. Other option Aricept, stimulant.   FU 8  Lynder Parents MD, DFAPA  Future Appointments  Date Time Provider [place]  06/24/2019 12:00 PM Starlyn Skeans, MD [facility] None  11/21/2019  1:00 PM Regina Eck, CNM [place] None    No orders of the defined types were placed in this encounter.     -------------------------------

## 2019-06-24 ENCOUNTER — Encounter (INDEPENDENT_AMBULATORY_CARE_PROVIDER_SITE_OTHER): Payer: Self-pay | Admitting: Family Medicine

## 2019-06-24 ENCOUNTER — Ambulatory Visit (INDEPENDENT_AMBULATORY_CARE_PROVIDER_SITE_OTHER): Payer: BC Managed Care – PPO | Admitting: Family Medicine

## 2019-06-24 VITALS — BP 104/68 | HR 76 | Temp 98.5°F | Ht 65.0 in | Wt 191.0 lb

## 2019-06-24 DIAGNOSIS — E781 Pure hyperglyceridemia: Secondary | ICD-10-CM

## 2019-06-24 DIAGNOSIS — E559 Vitamin D deficiency, unspecified: Secondary | ICD-10-CM

## 2019-06-24 DIAGNOSIS — Z9189 Other specified personal risk factors, not elsewhere classified: Secondary | ICD-10-CM

## 2019-06-24 DIAGNOSIS — Z6831 Body mass index (BMI) 31.0-31.9, adult: Secondary | ICD-10-CM

## 2019-06-24 DIAGNOSIS — R7303 Prediabetes: Secondary | ICD-10-CM | POA: Diagnosis not present

## 2019-06-24 DIAGNOSIS — R748 Abnormal levels of other serum enzymes: Secondary | ICD-10-CM | POA: Diagnosis not present

## 2019-06-24 DIAGNOSIS — E669 Obesity, unspecified: Secondary | ICD-10-CM

## 2019-06-24 MED ORDER — METFORMIN HCL 500 MG PO TABS: 500.0000 mg | ORAL_TABLET | Freq: Every day | ORAL | 0 refills | Status: DC

## 2019-06-24 MED ORDER — VITAMIN D (ERGOCALCIFEROL) 1.25 MG (50000 UNIT) PO CAPS: 50000.0000 [IU] | ORAL_CAPSULE | ORAL | 0 refills | Status: DC

## 2019-06-24 NOTE — Progress Notes (Signed)
Chief Complaint:   OBESITY Marisa Hamilton is here to discuss her progress with her obesity treatment plan along with follow-up of her obesity related diagnoses. Marisa Hamilton is on the Category 2 Plan and states she is following her eating plan approximately 90% of the time. Marisa Hamilton states she is doing pilates and walking for 10-60 minutes 5 times per week.  Today's visit was #: 2 Starting weight: 193 lbs Starting date: 06/10/2019 Today's weight: 191 lbs Today's date: 06/24/2019 Total lbs lost to date: 2 Total lbs lost since last in-office visit: 2  Interim History: Marisa Hamilton has done well with weight loss on her Category 2 plan. Her hunger was mostly controlled with minimal GI upset, although she found eating meat to be difficult.  Subjective:   1. Pre-diabetes Marisa Hamilton has a new diagnosis of pre-diabetes. Her A1c, glucose, and insulin levels are elevated. She notes a family history of diabetes mellitus. I discussed labs with the patient today.  2. Vitamin D deficiency Marisa Hamilton is on Vit D OTC 7,500 IU daily. Last Vit D Hamilton is at goal but she is at risk of over-replacement. I discussed labs with the patient today.  3. Hypertriglyceridemia Marisa Hamilton has a new diagnosis of hypertriglycerides. Her triglycerides are above goal. She denies abdominal pain. She is attempting to improve with diet. I discussed labs with the patient today.  4. Elevated vitamin Hamilton Hamilton Marisa Hamilton is over-replaced. She is on OTC Hamilton supplementation. I discussed labs with the patient today.  5. At risk for diabetes mellitus Marisa Hamilton is at higher than average risk for developing diabetes due to her obesity.   Assessment/Plan:   1. Pre-diabetes Marisa Hamilton will continue to work on weight loss, exercise, and decreasing simple carbohydrates to help decrease the risk of diabetes. Marisa Hamilton agreed to start metformin 500 mg daily with no refill. We will continue to monitor.  - metFORMIN (GLUCOPHAGE) 500 MG tablet; Take 1 tablet (500 mg total) by  mouth daily with breakfast.  Dispense: 30 tablet; Refill: 0  2. Vitamin D deficiency Marisa Hamilton contributes to fatigue and are associated with obesity, breast, and colon cancer. Marisa Hamilton agreed to change Vit D to 3 times per week. She will follow-up for routine testing of Vitamin D, at least 2-3 times per year to avoid over-replacement. We will recheck labs in 3 months.  - Vitamin D, Ergocalciferol, (DRISDOL) 1.25 MG (50000 UNIT) CAPS capsule; Take 1 capsule (50,000 Units total) by mouth every 3 (three) days.  Dispense: 10 capsule; Refill: 0  3. Hypertriglyceridemia Marisa Hamilton will continue her diet and we will recheck labs in 3 months.  4. Elevated vitamin Hamilton Hamilton Marisa Hamilton agreed to change Vit Hamilton to 3 times per week, and we will recheck labs in 3 months.  5. At risk for diabetes mellitus Marisa Hamilton was given approximately 30 minutes of diabetes education and counseling today. We discussed intensive lifestyle modifications today with an emphasis on weight loss as well as increasing exercise and decreasing simple carbohydrates in her diet. We also reviewed medication options with an emphasis on risk versus benefit of those discussed.   Repetitive spaced learning was employed today to elicit superior memory formation and behavioral change.  6. Class 1 obesity with serious comorbidity and body mass index (BMI) of 31.0 to 31.9 in adult, unspecified obesity type Marisa Hamilton is currently in the action stage of change. As such, her goal is to continue with weight loss efforts. She has agreed to the Category 2 Plan or the Vegetarian  Plan.   Exercise goals: As is  Behavioral modification strategies: increasing lean protein intake and meal planning and cooking strategies.  Zellie has agreed to follow-up with our clinic in 2 weeks with [facility], FNP-C. She was informed of the importance of frequent follow-up visits to maximize her success with intensive lifestyle modifications for her multiple health  conditions.   Objective:   Blood pressure 104/68, pulse 76, temperature 98.5 F (36.9 C), temperature source Oral, height 5' 5"  (1.651 m), weight 191 lb (86.6 kg), last menstrual period 03/10/2013, SpO2 94 %. Body mass index is 31.78 kg/m.  General: Cooperative, alert, well developed, in no acute distress. HEENT: Conjunctivae and lids unremarkable. Cardiovascular: Regular rhythm.  Lungs: Normal work of breathing. Neurologic: No focal deficits.   Lab Results  Component Value Date   CREATININE 1.00 06/10/2019   BUN 15 06/10/2019   NA 142 06/10/2019   K 4.9 06/10/2019   CL 101 06/10/2019   CO2 25 06/10/2019   Lab Results  Component Value Date   ALT 22 06/10/2019   AST 26 06/10/2019   ALKPHOS 72 06/10/2019   BILITOT 0.2 06/10/2019   Lab Results  Component Value Date   HGBA1C 5.7 (H) 06/10/2019   HGBA1C 5.6 08/04/2013   Lab Results  Component Value Date   INSULIN 22.9 06/10/2019   Lab Results  Component Value Date   TSH 2.550 06/10/2019   Lab Results  Component Value Date   CHOL 187 06/10/2019   HDL 68 06/10/2019   LDLCALC 89 06/10/2019   TRIG 181 (H) 06/10/2019   CHOLHDL 2.7 08/04/2013   Lab Results  Component Value Date   WBC 9.2 06/10/2019   HGB 14.5 06/10/2019   HCT 42.9 06/10/2019   MCV 89 06/10/2019   PLT 288 06/10/2019   No results found for: IRON, TIBC, FERRITIN  Attestation Statements:   Reviewed by clinician on day of visit: allergies, medications, problem list, medical history, surgical history, family history, social history, and previous encounter notes.   I, Trixie Dredge, am acting as transcriptionist for Dennard Nip, MD.  I have reviewed the above documentation for accuracy and completeness, and I agree with the above. -  Dennard Nip, MD

## 2019-07-01 ENCOUNTER — Telehealth (INDEPENDENT_AMBULATORY_CARE_PROVIDER_SITE_OTHER): Payer: Self-pay

## 2019-07-01 NOTE — Telephone Encounter (Signed)
Patient called in stating she started Metformin one week ago and this morning severe diarrhea started. She states her bottom near where she wipes is in pain with the touch of tissue and she noticed blood on the last time she wiped. After speaking with Dr Leafy Ro informed the patient to hold the Metformin at this time and to increase her water intake to ensure she is hydrated. Also informed her if her symptoms persist to contact her PCP as this sounds more like a GI bug rather then a side effect. Patient verbalized understanding.   Marisa Hamilton, [place]

## 2019-07-02 ENCOUNTER — Other Ambulatory Visit: Payer: Self-pay

## 2019-07-02 DIAGNOSIS — F99 Mental disorder, not otherwise specified: Secondary | ICD-10-CM

## 2019-07-02 DIAGNOSIS — F5105 Insomnia due to other mental disorder: Secondary | ICD-10-CM

## 2019-07-02 MED ORDER — CLONAZEPAM 1 MG PO TABS: 1.0000 mg | ORAL_TABLET | Freq: Every day | ORAL | 5 refills | Status: DC

## 2019-07-08 ENCOUNTER — Ambulatory Visit (INDEPENDENT_AMBULATORY_CARE_PROVIDER_SITE_OTHER): Payer: BC Managed Care – PPO | Admitting: Family Medicine

## 2019-07-09 ENCOUNTER — Encounter (INDEPENDENT_AMBULATORY_CARE_PROVIDER_SITE_OTHER): Payer: Self-pay | Admitting: Family Medicine

## 2019-07-09 ENCOUNTER — Ambulatory Visit (INDEPENDENT_AMBULATORY_CARE_PROVIDER_SITE_OTHER): Payer: BC Managed Care – PPO | Admitting: Family Medicine

## 2019-07-09 ENCOUNTER — Other Ambulatory Visit: Payer: Self-pay

## 2019-07-09 VITALS — BP 100/60 | HR 77 | Temp 97.9°F | Ht 65.0 in | Wt 190.0 lb

## 2019-07-09 DIAGNOSIS — Z9189 Other specified personal risk factors, not elsewhere classified: Secondary | ICD-10-CM

## 2019-07-09 DIAGNOSIS — Z6831 Body mass index (BMI) 31.0-31.9, adult: Secondary | ICD-10-CM

## 2019-07-09 DIAGNOSIS — E559 Vitamin D deficiency, unspecified: Secondary | ICD-10-CM

## 2019-07-09 DIAGNOSIS — M545 Low back pain, unspecified: Secondary | ICD-10-CM

## 2019-07-09 DIAGNOSIS — E781 Pure hyperglyceridemia: Secondary | ICD-10-CM | POA: Diagnosis not present

## 2019-07-09 DIAGNOSIS — E669 Obesity, unspecified: Secondary | ICD-10-CM

## 2019-07-09 DIAGNOSIS — R7303 Prediabetes: Secondary | ICD-10-CM

## 2019-07-09 DIAGNOSIS — G8929 Other chronic pain: Secondary | ICD-10-CM

## 2019-07-09 NOTE — Patient Instructions (Signed)
[email or web address]

## 2019-07-09 NOTE — Progress Notes (Signed)
Chief Complaint:   OBESITY Marisa Hamilton is here to discuss her progress with her obesity treatment plan along with follow-up of her obesity related diagnoses. Makaylynn is on the Category 2 Plan or the [place] and states she is following her eating plan approximately 90% of the time. Laporche states she is exercising for 0 minutes 0 times per week.  Today's visit was #: 3 Starting weight: 193 lbs Starting date: 06/10/2019 Today's weight: 190 lbs Today's date: 07/09/2019 Total lbs lost to date: 3 lbs Total lbs lost since last in-office visit: 1 lb  Interim History:   Subjective:   1. Prediabetes Saoirse has a diagnosis of prediabetes based on her elevated HgA1c and was informed this puts her at greater risk of developing diabetes. She continues to work on diet and exercise to decrease her risk of diabetes. She denies nausea or hypoglycemia.  Veena was taking metformin 500 mg daily and had 1 day of diarrhea.  She stopped metformin but thinks it may have been a GI bug.  Lab Results  Component Value Date   HGBA1C 5.7 (H) 06/10/2019   Lab Results  Component Value Date   INSULIN 22.9 06/10/2019   2. Hypertriglyceridemia Lasharn has elevated triglycerides and has been trying to improve her cholesterol levels with intensive lifestyle modification including a low saturated fat diet, exercise and weight loss. She denies any chest pain, claudication or myalgias.  Lab Results  Component Value Date   ALT 22 06/10/2019   AST 26 06/10/2019   ALKPHOS 72 06/10/2019   BILITOT 0.2 06/10/2019   Lab Results  Component Value Date   CHOL 187 06/10/2019   HDL 68 06/10/2019   LDLCALC 89 06/10/2019   TRIG 181 (H) 06/10/2019   CHOLHDL 2.7 08/04/2013   3. Vitamin D deficiency Tyauna's Vitamin D level was 80.0 on 06/10/2019. She is currently taking vit D. She denies nausea, vomiting or muscle weakness.  4. Chronic low back pain, unspecified back pain laterality, unspecified whether sciatica  present With fibromyalgia.  Ailish does pilates and had a recent sprain.  She has tried muscle relaxer, chiropractor.  5. At risk for diabetes mellitus Shakima is at higher than average risk for developing diabetes due to her obesity.   Assessment/Plan:   1. Prediabetes Turquoise will continue to work on weight loss, exercise, and decreasing simple carbohydrates to help decrease the risk of diabetes.  Will retry metformin.  2. Hypertriglyceridemia Will work on prediabetes.  3. Vitamin D deficiency Low Vitamin D level contributes to fatigue and are associated with obesity, breast, and colon cancer. She agrees to continue to take prescription Vitamin D @50 ,000 IU every week and will follow-up for routine testing of Vitamin D, at least 2-3 times per year to avoid over-replacement.  4. Chronic low back pain, unspecified back pain laterality, unspecified whether sciatica present Reviewed PT, muscle relaxation/massage, fibro resources, sports med/osteopathic resources.  5. At risk for diabetes mellitus Shanai was given approximately 15 minutes of diabetes education and counseling today. We discussed intensive lifestyle modifications today with an emphasis on weight loss as well as increasing exercise and decreasing simple carbohydrates in her diet. We also reviewed medication options with an emphasis on risk versus benefit of those discussed.   Repetitive spaced learning was employed today to elicit superior memory formation and behavioral change.  6. Class 1 obesity with serious comorbidity and body mass index (BMI) of 31.0 to 31.9 in adult, unspecified obesity type Azelia is currently in the  action stage of change. As such, her goal is to continue with weight loss efforts. She has agreed to the Category 2 Plan or the [place].   Exercise goals: Pilates.  Behavioral modification strategies: increasing lean protein intake and increasing water intake.  Thomasine has agreed to follow-up with our  clinic in 2 weeks. She was informed of the importance of frequent follow-up visits to maximize her success with intensive lifestyle modifications for her multiple health conditions.   Objective:   Blood pressure 100/60, pulse 77, temperature 97.9 F (36.6 C), temperature source Oral, height 5' 5"  (1.651 m), weight 190 lb (86.2 kg), last menstrual period 03/10/2013, SpO2 97 %. Body mass index is 31.62 kg/m.  General: Cooperative, alert, well developed, in no acute distress. HEENT: Conjunctivae and lids unremarkable. Cardiovascular: Regular rhythm.  Lungs: Normal work of breathing. Neurologic: No focal deficits.   Lab Results  Component Value Date   CREATININE 1.00 06/10/2019   BUN 15 06/10/2019   NA 142 06/10/2019   K 4.9 06/10/2019   CL 101 06/10/2019   CO2 25 06/10/2019   Lab Results  Component Value Date   ALT 22 06/10/2019   AST 26 06/10/2019   ALKPHOS 72 06/10/2019   BILITOT 0.2 06/10/2019   Lab Results  Component Value Date   HGBA1C 5.7 (H) 06/10/2019   HGBA1C 5.6 08/04/2013   Lab Results  Component Value Date   INSULIN 22.9 06/10/2019   Lab Results  Component Value Date   TSH 2.550 06/10/2019   Lab Results  Component Value Date   CHOL 187 06/10/2019   HDL 68 06/10/2019   LDLCALC 89 06/10/2019   TRIG 181 (H) 06/10/2019   CHOLHDL 2.7 08/04/2013   Lab Results  Component Value Date   WBC 9.2 06/10/2019   HGB 14.5 06/10/2019   HCT 42.9 06/10/2019   MCV 89 06/10/2019   PLT 288 06/10/2019   Attestation Statements:   Reviewed by clinician on day of visit: allergies, medications, problem list, medical history, surgical history, family history, social history, and previous encounter notes.  I, Water quality scientist, CMA, am acting as Location manager for [employer], DO.  I have reviewed the above documentation for accuracy and completeness, and I agree with the above. Briscoe Deutscher, DO

## 2019-07-31 ENCOUNTER — Encounter (INDEPENDENT_AMBULATORY_CARE_PROVIDER_SITE_OTHER): Payer: Self-pay | Admitting: Family Medicine

## 2019-07-31 ENCOUNTER — Ambulatory Visit (INDEPENDENT_AMBULATORY_CARE_PROVIDER_SITE_OTHER): Payer: BC Managed Care – PPO | Admitting: Family Medicine

## 2019-07-31 ENCOUNTER — Other Ambulatory Visit: Payer: Self-pay

## 2019-07-31 VITALS — BP 106/69 | HR 71 | Temp 98.1°F | Ht 65.0 in | Wt 186.0 lb

## 2019-07-31 DIAGNOSIS — E559 Vitamin D deficiency, unspecified: Secondary | ICD-10-CM | POA: Diagnosis not present

## 2019-07-31 DIAGNOSIS — Z6831 Body mass index (BMI) 31.0-31.9, adult: Secondary | ICD-10-CM

## 2019-07-31 DIAGNOSIS — G8929 Other chronic pain: Secondary | ICD-10-CM | POA: Diagnosis not present

## 2019-07-31 DIAGNOSIS — R7303 Prediabetes: Secondary | ICD-10-CM

## 2019-07-31 DIAGNOSIS — E781 Pure hyperglyceridemia: Secondary | ICD-10-CM

## 2019-07-31 DIAGNOSIS — Z9189 Other specified personal risk factors, not elsewhere classified: Secondary | ICD-10-CM

## 2019-07-31 DIAGNOSIS — F3289 Other specified depressive episodes: Secondary | ICD-10-CM

## 2019-07-31 DIAGNOSIS — E669 Obesity, unspecified: Secondary | ICD-10-CM

## 2019-07-31 MED ORDER — METFORMIN HCL 500 MG PO TABS: 500.0000 mg | ORAL_TABLET | Freq: Every day | ORAL | 0 refills | Status: DC

## 2019-07-31 NOTE — Progress Notes (Signed)
Chief Complaint:   OBESITY Marisa Hamilton is here to discuss her progress with her obesity treatment plan along with follow-up of her obesity related diagnoses. Marisa Hamilton is on the Category 2 Plan and the Marisa Hamilton and states she is following her eating plan approximately 80% of the time. Marisa Hamilton states she is exercising for 0 minutes 0 times per week.  Today's visit was #: 4 Starting weight: 193 lbs Starting date: 06/10/2019 Today's weight: 186 lbs Today's date: 07/31/2019 Total lbs lost to date: 7 lbs Total lbs lost since last in-office visit: 4 lbs  Interim History: Marisa Hamilton reports that she struggles at night after dinner.  She has been snacking on [place], crackers, and Kind.  She says she eats pork and beef.  Subjective:   1. Prediabetes Marisa Hamilton has a diagnosis of prediabetes based on her elevated HgA1c and was informed this puts her at greater risk of developing diabetes. She continues to work on diet and exercise to decrease her risk of diabetes. She denies nausea or hypoglycemia.  She is taking metformin 500 mg daily.  Lab Results  Component Value Date   HGBA1C 5.7 (H) 06/10/2019   Lab Results  Component Value Date   INSULIN 22.9 06/10/2019   2. Vitamin D deficiency Marisa Hamilton's Vitamin D level was 80.0 on 06/10/2019. She is currently taking vit D. She denies nausea, vomiting or muscle weakness.  3. Hypertriglyceridemia Marisa Hamilton has hypertriglyceridemia and has been trying to improve her cholesterol levels with intensive lifestyle modification including a low saturated fat diet, exercise and weight loss. She denies any chest pain, claudication or myalgias.  Lab Results  Component Value Date   ALT 22 06/10/2019   AST 26 06/10/2019   ALKPHOS 72 06/10/2019   BILITOT 0.2 06/10/2019   Lab Results  Component Value Date   CHOL 187 06/10/2019   HDL 68 06/10/2019   LDLCALC 89 06/10/2019   TRIG 181 (H) 06/10/2019   CHOLHDL 2.7 08/04/2013   4. Other chronic pain Marisa Hamilton suffers from  chronic pain along with fibromyalgia.  5. Other depression, with emotional eating Marisa Hamilton is struggling with emotional eating and using food for comfort to the extent that it is negatively impacting her health. She has been working on behavior modification techniques to help reduce her emotional eating and has been unsuccessful. She shows no sign of suicidal or homicidal ideations.  Assessment/Plan:   1. Prediabetes Marisa Hamilton will continue to work on weight loss, exercise, and decreasing simple carbohydrates to help decrease the risk of diabetes.   Orders - metFORMIN (GLUCOPHAGE) 500 MG tablet; Take 1 tablet (500 mg total) by mouth daily with breakfast.  Dispense: 30 tablet; Refill: 0  2. Vitamin D deficiency Low Vitamin D level contributes to fatigue and are associated with obesity, breast, and colon cancer. She agrees to continue to take Vitamin D @7500  IU every 3 days and will follow-up for routine testing of Vitamin D, at least 2-3 times per year to avoid over-replacement.  3. Hypertriglyceridemia Cardiovascular risk and specific lipid/LDL goals reviewed.  We discussed several lifestyle modifications today and Marisa Hamilton will continue to work on diet, exercise and weight loss efforts. Orders and follow up as documented in patient record.   Counseling Intensive lifestyle modifications are the first line treatment for this issue. . Dietary changes: Increase soluble fiber. Decrease simple carbohydrates. . Exercise changes: Moderate to vigorous-intensity aerobic activity 150 minutes per week if tolerated. . Lipid-lowering medications: see documented in medical record.  4. Other chronic pain  We will continue to monitor. Orders and follow up as documented in patient record.  5. Other depression, with emotional eating Patient was referred to Dr. Mallie Mussel, our Bariatric Psychologist, for evaluation due to her elevated PHQ-9 score and significant struggles with emotional eating.  6. At risk for heart  disease Marisa Hamilton was given approximately 15 minutes of coronary artery disease prevention counseling today. She is 59 y.o. female and has risk factors for heart disease including obesity. We discussed intensive lifestyle modifications today with an emphasis on specific weight loss instructions and strategies.   Repetitive spaced learning was employed today to elicit superior memory formation and behavioral change.  7. Class 1 obesity with serious comorbidity and body mass index (BMI) of 31.0 to 31.9 in adult, unspecified obesity type  Marisa Hamilton is currently in the action stage of change. As such, her goal is to continue with weight loss efforts. She has agreed to the Category 2 Plan.   Exercise goals: As is.  Behavioral modification strategies: increasing lean protein intake, ways to avoid night time snacking, emotional eating strategies and planning for success.  Marisa Hamilton has agreed to follow-up with our clinic in 2 weeks. She was informed of the importance of frequent follow-up visits to maximize her success with intensive lifestyle modifications for her multiple health conditions.   Objective:   Blood pressure 106/69, pulse 71, temperature 98.1 F (36.7 C), temperature source Oral, height 5' 5"  (1.651 m), weight 186 lb (84.4 kg), last menstrual period 03/10/2013, SpO2 98 %. Body mass index is 30.95 kg/m.  General: Cooperative, alert, well developed, in no acute distress. HEENT: Conjunctivae and lids unremarkable. Cardiovascular: Regular rhythm.  Lungs: Normal work of breathing. Neurologic: No focal deficits.   Lab Results  Component Value Date   CREATININE 1.00 06/10/2019   BUN 15 06/10/2019   NA 142 06/10/2019   K 4.9 06/10/2019   CL 101 06/10/2019   CO2 25 06/10/2019   Lab Results  Component Value Date   ALT 22 06/10/2019   AST 26 06/10/2019   ALKPHOS 72 06/10/2019   BILITOT 0.2 06/10/2019   Lab Results  Component Value Date   HGBA1C 5.7 (H) 06/10/2019   HGBA1C 5.6  08/04/2013   Lab Results  Component Value Date   INSULIN 22.9 06/10/2019   Lab Results  Component Value Date   TSH 2.550 06/10/2019   Lab Results  Component Value Date   CHOL 187 06/10/2019   HDL 68 06/10/2019   LDLCALC 89 06/10/2019   TRIG 181 (H) 06/10/2019   CHOLHDL 2.7 08/04/2013   Lab Results  Component Value Date   WBC 9.2 06/10/2019   HGB 14.5 06/10/2019   HCT 42.9 06/10/2019   MCV 89 06/10/2019   PLT 288 06/10/2019   Attestation Statements:   Reviewed by clinician on day of visit: allergies, medications, problem list, medical history, surgical history, family history, social history, and previous encounter notes.  I, Water quality scientist, CMA, am acting as Location manager for [employer], DO.  I have reviewed the above documentation for accuracy and completeness, and I agree with the above. Briscoe Deutscher, DO

## 2019-08-05 ENCOUNTER — Encounter: Payer: Self-pay | Admitting: Certified Nurse Midwife

## 2019-08-18 ENCOUNTER — Other Ambulatory Visit: Payer: Self-pay

## 2019-08-18 ENCOUNTER — Encounter: Payer: Self-pay | Admitting: Psychiatry

## 2019-08-18 ENCOUNTER — Ambulatory Visit (INDEPENDENT_AMBULATORY_CARE_PROVIDER_SITE_OTHER): Payer: BC Managed Care – PPO | Admitting: Psychiatry

## 2019-08-18 DIAGNOSIS — F900 Attention-deficit hyperactivity disorder, predominantly inattentive type: Secondary | ICD-10-CM | POA: Diagnosis not present

## 2019-08-18 DIAGNOSIS — F99 Mental disorder, not otherwise specified: Secondary | ICD-10-CM

## 2019-08-18 DIAGNOSIS — F331 Major depressive disorder, recurrent, moderate: Secondary | ICD-10-CM | POA: Diagnosis not present

## 2019-08-18 DIAGNOSIS — F411 Generalized anxiety disorder: Secondary | ICD-10-CM | POA: Diagnosis not present

## 2019-08-18 DIAGNOSIS — F5105 Insomnia due to other mental disorder: Secondary | ICD-10-CM

## 2019-08-18 DIAGNOSIS — F3342 Major depressive disorder, recurrent, in full remission: Secondary | ICD-10-CM

## 2019-08-18 MED ORDER — SERTRALINE HCL 25 MG PO TABS: 25.0000 mg | ORAL_TABLET | Freq: Two times a day (BID) | ORAL | 1 refills | Status: DC

## 2019-08-18 MED ORDER — CLONAZEPAM 1 MG PO TABS: 1.0000 mg | ORAL_TABLET | Freq: Every day | ORAL | 5 refills | Status: DC

## 2019-08-18 MED ORDER — METHYLPHENIDATE HCL 5 MG PO TABS: 7.5000 mg | ORAL_TABLET | Freq: Two times a day (BID) | ORAL | 0 refills | Status: DC

## 2019-08-18 MED ORDER — TRAZODONE HCL 100 MG PO TABS: 100.0000 mg | ORAL_TABLET | Freq: Every day | ORAL | 1 refills | Status: DC

## 2019-08-18 MED ORDER — LAMOTRIGINE 100 MG PO TABS
100.0000 mg | ORAL_TABLET | Freq: Every day | ORAL | 1 refills | Status: DC
Start: 2019-08-18 — End: 2019-12-23

## 2019-08-18 NOTE — Progress Notes (Signed)
Marisa Hamilton [id number] 05-Jan-1961 59 y.o.  Subjective:   Patient ID:  Marisa Hamilton is a 59 y.o. (DOB 1960/08/08) female.  Chief Complaint:  No chief complaint on file.   Depression        Associated symptoms include headaches.  Associated symptoms include no decreased concentration and no suicidal ideas.  Neala Miggins presents to the office today for follow-up of depression and anxiety  seen in August 2020.  No meds were changed. Remains on sertrline 37.5 mg, lamotrigine, clonazepam 41m HS.     Last seen June 23, 2019.  She reported the following: Wonderful until Saturday.  [place] [place] [facility] [place]  [facility]  Nervous about it and that she can't meds needed.   This weekend felt worse, on edge and anxious and irritable.  Anger outburst Saturday without reason.  Yesterday dysfunctional.  Also just started a diet as well 2 weeks ago.  Harder to do things. Still Overall feeling good and doesn't want change.  Cannot stop clonazepam and still sleep.  Sleep remained steady.  Control of HA has helped mood "all the difference in the world".  Better handling stressor of mother and in laws thourhg therapy and meds.  We did decided not to alter any psychiatric meds until she had her headache meds reevaluated.  Back problems and hasn't been able to change HA meds.  Then had food poisoning last week.  Thinks can start new HA med Monday.Byepti.   HA sort of controlled. Health problems are pretty frustrating bc never feel good.  There is always something.  Mo will disbelieve her.   Vaccinated with JWynetta Emeryand [employer] \ Continued cognitive complaints consistent with an ADD pattern.  Uncertain as to whether she actually had this as a child or perhaps as a complication of chronic migraine or depression.  She is having disability related and would like to try medication for it.  Patient reports mild depressed or irritable moods.  Patient denies any recent difficulty with  anxiety except with mother.  Patient denies difficulty with sleep initiation or maintenance.8 hours lately but variable.  Denies appetite disturbance.  Patient reports that energy and motivation have been good.    Patient denies any suicidal ideation.  Failed psychiatric medication trials include Lexapro, lithium, amitriptyline, Emsam, duloxetine, Pristiq,  Wellbutrin, fluoxetine,  venlafaxine, valproic acid,buspirone, Abilify, clonidine, Topamax, Depakote, Deplin, carbamazepine.   Sleep med failures include amitriptyline, mirtazapine, trazodone, doxepin which caused nightmares, Ambien, gabapentin, Sonata, cyclobenzaprine, Xanax.  Son OCD on Zoloft  Review of Systems:  Review of Systems  Musculoskeletal: Positive for back pain.  Neurological: Positive for headaches. Negative for dizziness, tremors and weakness.  Psychiatric/Behavioral: Positive for depression. Negative for agitation, behavioral problems, confusion, decreased concentration, dysphoric mood, hallucinations, self-injury, sleep disturbance and suicidal ideas. The patient is nervous/anxious. The patient is not hyperactive.     Medications: I have reviewed the patient's current medications.  Current Outpatient Medications  Medication Sig Dispense Refill  . AIMOVIG 140 MG/ML SOAJ Inject 1 mL (140 mg dose) into the skin every 30 (thirty) days.  5  . baclofen (LIORESAL) 10 MG tablet Take 10 mg by mouth. Take one tablet by mouth three times a day as needed    . CALCIUM PO Take 600 mg by mouth 2 (two) times daily.     . chlorproMAZINE (THORAZINE) 25 MG tablet Take 1 tablet by mouth as needed.    . Cholecalciferol (VITAMIN D PO) Take 7,500 Int'l Units by  mouth every 3 (three) days.     . clonazePAM (KLONOPIN) 1 MG tablet Take 1 tablet (1 mg total) by mouth at bedtime. 90 tablet 5  . Cyanocobalamin (B-12 PO) Take 5,000 mcg by mouth.    [name] Calcium (STOOL SOFTENER PO) Take by mouth at bedtime as needed.     . Famotidine (PEPCID  PO) Take by mouth. Once or twice daily    . FIBER ADULT GUMMIES PO Take 2 each by mouth daily.    [name] Ketorolac Tromethamine (TORADOL IJ) Inject as directed as needed.    . lamoTRIgine (LAMICTAL) 100 MG tablet Take 1 tablet (100 mg total) by mouth daily. 90 tablet 1  . Lasmiditan Succinate (REYVOW) 50 MG TABS Take by mouth daily as needed.     [name] MAGNESIUM PO Take 1,350 mg by mouth. 3 pills at night    . metFORMIN (GLUCOPHAGE) 500 MG tablet Take 1 tablet (500 mg total) by mouth daily with breakfast. 30 tablet 0  . Multiple Vitamins-Minerals (CENTRUM SILVER PO) Take 1 tablet by mouth daily.    [name] (SYMPROIC) 0.2 MG TABS Take 1 tablet by mouth daily.    [name] omeprazole (PRILOSEC) 40 MG capsule TAKE 1 CAPSULE BY MOUTH 20 minutes BEFORE breakfast every DAY    . ondansetron (ZOFRAN) 8 MG tablet Take by mouth.    . polyethylene glycol powder (GLYCOLAX/MIRALAX) powder Take by mouth daily as needed.     . Probiotic Product (PROBIOTIC PO) Take by mouth daily.    . Rimegepant Sulfate (NURTEC) 75 MG TBDP Take 1 tablet by mouth. Take one tablet every 24 hours as needed for migraine onset    . sertraline (ZOLOFT) 25 MG tablet Take 1 tablet (25 mg total) by mouth 2 (two) times daily. 180 tablet 1  . simethicone (MYLICON) 623 MG chewable tablet Chew 125 mg by mouth every 6 (six) hours as needed for flatulence.    [name] tiZANidine (ZANAFLEX) 4 MG tablet Take 1 tablet by mouth as needed.    . traZODone (DESYREL) 100 MG tablet Take 1 tablet (100 mg total) by mouth at bedtime. 90 tablet 1  . Vitamin D, Ergocalciferol, (DRISDOL) 1.25 MG (50000 UNIT) CAPS capsule Take 1 capsule (50,000 Units total) by mouth every 3 (three) days. 10 capsule 0  . VITAMIN K PO Take 150 mcg by mouth.    . methylphenidate (RITALIN) 5 MG tablet Take 1.5 tablets (7.5 mg total) by mouth 2 (two) times daily. 90 tablet 0   No current facility-administered medications for this visit.    Medication Side Effects: None  Allergies:   Allergies  Allergen Reactions  . Nsaids Other (See Comments)    Other reaction(s): Abdominal Pain Ulcerative colitis  . Gluten Meal Other (See Comments)    Stomach upset  . Milk-Related Compounds Other (See Comments)    Stomach upset  . Phentermine     Pt stated, "Made my IC flare up"    Past Medical History:  Diagnosis Date  . Anxiety   . Back pain   . CFS (chronic fatigue syndrome)   . Concussion 03-17-17  . Constipation   . Cystitis, interstitial    HAD BLADDER SX FOR SAME  . Depression   . Fainting spell   . Fibromyalgia   . Food allergy   . Foot pain   . GERD (gastroesophageal reflux disease)   . Headache(784.0)   . IBS (irritable bowel syndrome)    TAKES PROBIOTICS  . Insomnia   .  Lactose intolerance   . Leg pain   . Migraine   . MVP (mitral valve prolapse)   . Nausea   . Neck pain   . PONV (postoperative nausea and vomiting)   . Restless leg syndrome   . Stomach ache   . Ulcerative colitis ([place])   . Varicose veins     Family History  Problem Relation Age of Onset  . Osteoporosis Mother   . Atrial fibrillation Mother   . Heart disease Mother   . Stroke Mother   . Depression Mother   . Anxiety disorder Mother   . Obesity Mother   . Diabetes Father   . Hypertension Father   . Depression Father   . Obesity Father   . Breast cancer Maternal Grandmother   . Cancer Maternal Grandmother        ovarian  . Diabetes Paternal Grandmother   . Migraines Brother        CHRONIC    Social History   Socioeconomic History  . Marital status: Married    Spouse name: Therapist, sports  . Number of children: Not on file  . Years of education: Not on file  . Highest education level: Not on file  Occupational History  . Occupation: Retired Wellsite geologist  Tobacco Use  . Smoking status: Never Smoker  . Smokeless tobacco: Never Used  Substance and Sexual Activity  . Alcohol use: Yes    Alcohol/week: 1.0 standard drinks    Types: 1 Standard drinks or  equivalent per week  . Drug use: No  . Sexual activity: Yes    Partners: Male    Birth control/protection: Other-see comments    Comment: husband vasectomy  Other Topics Concern  . Not on file  Social History Narrative  . Not on file   Social Determinants of Health   Financial Resource Strain:   . Difficulty of Paying Living Expenses:   Food Insecurity:   . Worried About Charity fundraiser in the Last Year:   . Arboriculturist in the Last Year:   Transportation Needs:   . Film/video editor (Medical):   [name] Lack of Transportation (Non-Medical):   Physical Activity:   . Days of Exercise per Week:   . Minutes of Exercise per Session:   Stress:   . Feeling of Stress :   Social Connections:   . Frequency of Communication with Friends and Family:   . Frequency of Social Gatherings with Friends and Family:   . Attends Religious Services:   . Active Member of Clubs or Organizations:   . Attends Archivist Meetings:   [name] Marital Status:   Intimate Partner Violence:   . Fear of Current or Ex-Partner:   . Emotionally Abused:   [name] Physically Abused:   . Sexually Abused:     Past Medical History, Surgical history, Social history, and Family history were reviewed and updated as appropriate.   Please see review of systems for further details on the patient's review from today.   Objective:   Physical Exam:  LMP 03/10/2013   Physical Exam Constitutional:      General: She is not in acute distress.    Appearance: She is well-developed.  Musculoskeletal:        General: No deformity.  Neurological:     Mental Status: She is alert and oriented to person, place, and time.     Motor: No atrophy.     Coordination: Coordination normal.  Gait: Gait normal.  Psychiatric:        Attention and Perception: She is attentive.        Mood and Affect: Mood is anxious and depressed. Affect is not labile, blunt, angry or inappropriate.        Speech: Speech normal.         Behavior: Behavior normal.        Thought Content: Thought content normal. Thought content does not include homicidal or suicidal ideation. Thought content does not include homicidal or suicidal plan.        Judgment: Judgment normal.     Comments: Insight intact. No auditory or visual hallucinations. No delusions.  Has been some more depressed anxious and irritable with the health problems.     Lab Review:     Component Value Date/Time   NA 142 06/10/2019 1248   K 4.9 06/10/2019 1248   CL 101 06/10/2019 1248   CO2 25 06/10/2019 1248   GLUCOSE 100 (H) 06/10/2019 1248   GLUCOSE 103 (H) 10/18/2016 1830   BUN 15 06/10/2019 1248   CREATININE 1.00 06/10/2019 1248   CREATININE 0.87 08/04/2013 1100   CALCIUM 9.7 06/10/2019 1248   PROT 7.5 06/10/2019 1248   ALBUMIN 4.7 06/10/2019 1248   AST 26 06/10/2019 1248   ALT 22 06/10/2019 1248   ALKPHOS 72 06/10/2019 1248   BILITOT 0.2 06/10/2019 1248   GFRNONAA 61 06/10/2019 1248   GFRAA 71 06/10/2019 1248       Component Value Date/Time   WBC 9.2 06/10/2019 1248   WBC 8.3 12/10/2013 1426   RBC 4.82 06/10/2019 1248   RBC 4.73 12/10/2013 1426   HGB 14.5 06/10/2019 1248   HGB 13.8 07/30/2013 1333   HCT 42.9 06/10/2019 1248   PLT 288 06/10/2019 1248   MCV 89 06/10/2019 1248   MCH 30.1 06/10/2019 1248   MCH 29.8 12/10/2013 1426   MCHC 33.8 06/10/2019 1248   MCHC 34.1 12/10/2013 1426   RDW 13.2 06/10/2019 1248   LYMPHSABS 2.2 06/10/2019 1248   MONOABS 0.6 12/10/2013 1426   EOSABS 0.2 06/10/2019 1248   BASOSABS 0.1 06/10/2019 1248    No results found for: POCLITH, LITHIUM   No results found for: PHENYTOIN, PHENOBARB, VALPROATE, CBMZ    06/10/19 Normal D 80, B12 >2000, normal TSH, CBC, CMP  .res Assessment: Plan:    Diagnoses and all orders for this visit:  Major depressive disorder, recurrent episode, moderate (HCC)  Attention deficit hyperactivity disorder (ADHD), predominantly inattentive type -     methylphenidate  (RITALIN) 5 MG tablet; Take 1.5 tablets (7.5 mg total) by mouth 2 (two) times daily.  Generalized anxiety disorder -     sertraline (ZOLOFT) 25 MG tablet; Take 1 tablet (25 mg total) by mouth 2 (two) times daily.  Insomnia due to other mental disorder -     traZODone (DESYREL) 100 MG tablet; Take 1 tablet (100 mg total) by mouth at bedtime. -     clonazePAM (KLONOPIN) 1 MG tablet; Take 1 tablet (1 mg total) by mouth at bedtime.  Major depression, recurrent, full remission (HCC) -     sertraline (ZOLOFT) 25 MG tablet; Take 1 tablet (25 mg total) by mouth 2 (two) times daily. -     lamoTRIgine (LAMICTAL) 100 MG tablet; Take 1 tablet (100 mg total) by mouth daily.   Please see After Visit Summary for patient specific instructions.  Greater than 50% of face to face time with patient was spent  on counseling and coordination of care. 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  Most of the improvement has come through therapy and control of the headaches with some benefit from the medication as well particularly the sertraline.  Sertraline is also help with her anxiety.  The benefits of the meds for sleep are clear as we tried to discontinue clonazepam and she had worsening of not only sleep but also her other psychiatric symptoms.  She is acutely worse this weekend which is probably related to fear about losing her headache treatments.  Her headache treatments have made such a dramatic effect on her quality of life she fears losing that.  Was given some reassurance that is likely the new doctor taking over her care will continue what has been working.  The other factor is starting a sugar-free diet and she recognizes she is having some withdrawal from sugar.  The clonazepam is medically necessary.  She is failed multiple other sleep medications.  She is aware of sleep hygiene issues in detail.  She understands of the risk of long-term cognitive problems  related to it.  She accepts those risks.  Newer studies dispute the risk of dementia.   sHe is tolerating meds well.  She does not want any medication changes.  06/10/19 Normal D 80, B12 >2000, normal TSH, CBC, CMP  No med changes until evaluates HA meds. Better somewhat after sertraline 50 mg daily increase.  May reduce it back later.  Consider try NAC for mild cognitive complaints. Other option Aricept, stimulant.  Ok trial low dose Ritalin 2.5-7.49m  BID  Discussed potential benefits, risks, and side effects of stimulants with patient to include increased heart rate, palpitations, insomnia, increased anxiety, increased irritability, or decreased appetite.  Instructed patient to contact office if experiencing any significant tolerability issues.  FU 3 mos  CLynder ParentsMD, DFAPA  Future Appointments  Date Time [place] 08/21/2019 11:40 AM WBriscoe Deutscher DO [facility] None    No orders of the defined types were placed in this encounter.     -------------------------------

## 2019-08-21 ENCOUNTER — Other Ambulatory Visit: Payer: Self-pay

## 2019-08-21 ENCOUNTER — Ambulatory Visit (INDEPENDENT_AMBULATORY_CARE_PROVIDER_SITE_OTHER): Payer: BC Managed Care – PPO | Admitting: Family Medicine

## 2019-08-21 ENCOUNTER — Encounter (INDEPENDENT_AMBULATORY_CARE_PROVIDER_SITE_OTHER): Payer: Self-pay | Admitting: Family Medicine

## 2019-08-21 VITALS — BP 98/57 | HR 77 | Temp 97.5°F | Ht 65.0 in | Wt 187.0 lb

## 2019-08-21 DIAGNOSIS — F3289 Other specified depressive episodes: Secondary | ICD-10-CM | POA: Diagnosis not present

## 2019-08-21 DIAGNOSIS — R7303 Prediabetes: Secondary | ICD-10-CM

## 2019-08-21 DIAGNOSIS — R632 Polyphagia: Secondary | ICD-10-CM | POA: Diagnosis not present

## 2019-08-21 DIAGNOSIS — M797 Fibromyalgia: Secondary | ICD-10-CM

## 2019-08-21 DIAGNOSIS — Z6831 Body mass index (BMI) 31.0-31.9, adult: Secondary | ICD-10-CM

## 2019-08-21 DIAGNOSIS — E669 Obesity, unspecified: Secondary | ICD-10-CM

## 2019-08-21 MED ORDER — SAXENDA 18 MG/3ML ~~LOC~~ SOPN: 3.0000 mg | PEN_INJECTOR | Freq: Every day | SUBCUTANEOUS | 0 refills | Status: DC

## 2019-08-21 MED ORDER — BD PEN NEEDLE NANO 2ND GEN 32G X 4 MM MISC: 1.0000 | Freq: Every day | 0 refills | Status: DC
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
## 2019-08-21 NOTE — Progress Notes (Signed)
Chief Complaint:   OBESITY Marisa Hamilton is here to discuss her progress with her obesity treatment plan along with follow-up of her obesity related diagnoses. Marisa Hamilton is on the Category 2 Plan and states she is following her eating plan approximately 75% of the time. Marisa Hamilton states she is exercising for 0 minutes 0 times per week.  Today's visit was #: 5 Starting weight: 193 lbs Starting date: 06/10/2019 Today's weight: 187 lbs Today's date: 08/21/2019 Total lbs lost to date: 6 lbs Total lbs lost since last in-office visit: 0  Interim History: Marisa Hamilton has had an appointment with Dr. Clovis Pu.  We discussed starting a GLP-1 RA.  Subjective:   1. Fibromyalgia Yazmyne has the diagnosis of fibromyalgia. The symptoms are of mild and intermittent severity.   2. Prediabetes Esbeidy has a diagnosis of prediabetes based on her elevated HgA1c and was informed this puts her at greater risk of developing diabetes. She continues to work on diet and exercise to decrease her risk of diabetes. She denies nausea or hypoglycemia.  Lab Results  Component Value Date   HGBA1C 5.7 (H) 06/10/2019   Lab Results  Component Value Date   INSULIN 22.9 06/10/2019   3. Polyphagia Genola endorses excessive hunger.   4. Other depression, with emotional eating Damyiah is struggling with emotional eating and using food for comfort to the extent that it is negatively impacting her health. She has been working on behavior modification techniques to help reduce her emotional eating and has been somewhat successful.   Assessment/Plan:   1. Fibromyalgia Intensive lifestyle modifications are the first line treatment for this issue. We discussed several lifestyle modifications today and she will continue to work on diet, exercise and weight loss efforts.We will continue to monitor. Orders and follow up as documented in patient record.   Counseling . Try [email or web address]-[email or web address]/, which is a series of self-care modules  designed to teach patients several techniques to manage pain.   2. Prediabetes Marisa Hamilton will continue to work on weight loss, exercise, and decreasing simple carbohydrates to help decrease the risk of diabetes.  It is okay for her to stop taking metformin.  3. Polyphagia Intensive lifestyle modifications are the first line treatment for this issue. We discussed several lifestyle modifications today and she will continue to work on diet, exercise and weight loss efforts. Orders and follow up as documented in patient record.  Counseling . Polyphagia is excessive hunger. . Causes can include: low blood sugars, hypERthyroidism, PMS, lack of sleep, stress, insulin resistance, diabetes, certain medications, and diets that are deficient in protein and fiber.   4. Other depression, with emotional eating Patient was referred to Dr. Mallie Mussel, our Bariatric Psychologist, for evaluation due to her elevated PHQ-9 score and significant struggles with emotional eating.  5. Class 1 obesity with serious comorbidity and body mass index (BMI) of 31.0 to 31.9 in adult, unspecified obesity type  Orders - Liraglutide -Weight Management (SAXENDA) 18 MG/3ML SOPN; Inject 0.5 mLs (3 mg total) into the skin daily.  Dispense: 1 pen; Refill: 0 - Insulin Pen Needle (BD PEN NEEDLE NANO 2ND GEN) 32G X 4 MM MISC; 1 Package by Does not apply route daily.  Dispense: 100 each; Refill: 0  Marisa Hamilton is currently in the action stage of change. As such, her goal is to continue with weight loss efforts. She has agreed to the Category 2 Plan.   Exercise goals: All adults should avoid inactivity. Some physical activity is better than none, and adults who  participate in any amount of physical activity gain some health benefits.  Behavioral modification strategies: increasing lean protein intake and increasing water intake.  Marisa Hamilton has agreed to follow-up with our clinic in 2 weeks. She was informed of the importance of frequent follow-up  visits to maximize her success with intensive lifestyle modifications for her multiple health conditions.   Objective:   Blood pressure (!) 98/57, pulse 77, temperature (!) 97.5 F (36.4 C), temperature source Oral, height 5' 5"  (1.651 m), weight 187 lb (84.8 kg), last menstrual period 03/10/2013, SpO2 97 %. Body mass index is 31.12 kg/m.  General: Cooperative, alert, well developed, in no acute distress. HEENT: Conjunctivae and lids unremarkable. Cardiovascular: Regular rhythm.  Lungs: Normal work of breathing. Neurologic: No focal deficits.   Lab Results  Component Value Date   CREATININE 1.00 06/10/2019   BUN 15 06/10/2019   NA 142 06/10/2019   K 4.9 06/10/2019   CL 101 06/10/2019   CO2 25 06/10/2019   Lab Results  Component Value Date   ALT 22 06/10/2019   AST 26 06/10/2019   ALKPHOS 72 06/10/2019   BILITOT 0.2 06/10/2019   Lab Results  Component Value Date   HGBA1C 5.7 (H) 06/10/2019   HGBA1C 5.6 08/04/2013   Lab Results  Component Value Date   INSULIN 22.9 06/10/2019   Lab Results  Component Value Date   TSH 2.550 06/10/2019   Lab Results  Component Value Date   CHOL 187 06/10/2019   HDL 68 06/10/2019   LDLCALC 89 06/10/2019   TRIG 181 (H) 06/10/2019   CHOLHDL 2.7 08/04/2013   Lab Results  Component Value Date   WBC 9.2 06/10/2019   HGB 14.5 06/10/2019   HCT 42.9 06/10/2019   MCV 89 06/10/2019   PLT 288 06/10/2019   Attestation Statements:   Reviewed by clinician on day of visit: allergies, medications, problem list, medical history, surgical history, family history, social history, and previous encounter notes.  I, Water quality scientist, CMA, am acting as Location manager for [employer], DO.  I have reviewed the above documentation for accuracy and completeness, and I agree with the above. Briscoe Deutscher, DO

## 2019-08-25 ENCOUNTER — Telehealth: Payer: Self-pay | Admitting: Sports Medicine

## 2019-08-25 NOTE — Telephone Encounter (Signed)
Patient never received a callback for her results.

## 2019-08-25 NOTE — Telephone Encounter (Signed)
Will you let patient know that her fungal culture shows that she has both fungus in her nails as well as mold. Treatment options: Oral Lamisil 80% effective one pill a day for 90 days (we will need LFTs prior to starting this medication, we can mail her the requistion form) Laser treatment in office 80% effective  Topical antifungal nail solution 10-20% effective (can fax Rx to [place])  Patient can let us know which treatment option(s) she wants to try for her nails. Thanks Dr. Cannon Kettle

## 2019-08-26 ENCOUNTER — Telehealth: Payer: Self-pay | Admitting: *Deleted

## 2019-08-26 MED ORDER — NONFORMULARY OR COMPOUNDED ITEM: 11 refills | Status: DC

## 2019-08-26 NOTE — Addendum Note (Signed)
Addended by: Harriett Sine D on: 08/26/2019 02:22 PM   Modules accepted: Orders

## 2019-08-26 NOTE — Telephone Encounter (Signed)
I informed pt of results and Dr. Leeanne Rio review and orders. Pt states she would like to have the laser and topical. I explained to pt [place] [phone] would call with insurance coverage and delivery information. Faxed orders to [facility].

## 2019-08-26 NOTE — Telephone Encounter (Signed)
Entered in error

## 2019-08-26 NOTE — Telephone Encounter (Addendum)
Left message for pt to call for results twice.

## 2019-09-01 ENCOUNTER — Other Ambulatory Visit: Payer: Self-pay

## 2019-09-01 ENCOUNTER — Encounter (INDEPENDENT_AMBULATORY_CARE_PROVIDER_SITE_OTHER): Payer: Self-pay | Admitting: Family Medicine

## 2019-09-01 ENCOUNTER — Telehealth (INDEPENDENT_AMBULATORY_CARE_PROVIDER_SITE_OTHER): Payer: BC Managed Care – PPO | Admitting: Psychology

## 2019-09-01 DIAGNOSIS — F5089 Other specified eating disorder: Secondary | ICD-10-CM

## 2019-09-05 ENCOUNTER — Other Ambulatory Visit: Payer: BC Managed Care – PPO

## 2019-09-09 ENCOUNTER — Encounter (INDEPENDENT_AMBULATORY_CARE_PROVIDER_SITE_OTHER): Payer: Self-pay | Admitting: *Deleted

## 2019-09-09 NOTE — Progress Notes (Signed)
  Office: [phone]  /  Fax: [phone]    Date: Sep 22, 2019   Appointment Start Time: 2:16pm Duration: 32 minutes Provider: Glennie Isle, Psy.D. Type of Session: Individual Therapy  Location of Patient: Home Location of Provider: Provider's Home Type of Contact: Telepsychological Visit via MyChart Video Visit  Session Content: Marisa Hamilton is a 59 y.o. female presenting via [place] Visit for a follow-up appointment to address the previously established treatment goal of increasing coping skills. Today's appointment was a telepsychological visit due to COVID-19. Timara provided verbal consent for today's telepsychological appointment and she is aware she is responsible for securing confidentiality on her end of the session. Prior to proceeding with today's appointment, [name]'s physical location at the time of this appointment was obtained as well a phone number she could be reached at in the event of technical difficulties. [name] and this provider participated in today's telepsychological service. Of note, this provider had to reconnect via MyChart Video Visit at 2:38pm due to a connection issue.   This provider conducted a brief check-in. Emotional and physical hunger was reviewed. She shared what she observed since the last appointment with this provider. Positive reinforcement was provided. Psychoeducation regarding triggers for emotional eating was provided. [name] was provided a handout, and encouraged to utilize the handout between now and the next appointment to increase awareness of triggers and frequency. [name] agreed. [name] provided verbal consent during today's appointment for this provider to send a handout about triggers via e-mail. [name] was receptive to today's appointment as evidenced by openness to sharing, responsiveness to feedback, and willingness to explore triggers for emotional eating.  Mental Status Examination:  Appearance: well groomed and appropriate hygiene    Behavior: appropriate to circumstances Mood: euthymic Affect: mood congruent Speech: normal in rate, volume, and tone Eye Contact: appropriate Psychomotor Activity: appropriate Gait: unable to assess Thought Process: linear, logical, and goal directed  Thought Content/Perception: no hallucinations, delusions, bizarre thinking or behavior reported or observed and no evidence of suicidal and homicidal ideation, plan, and intent Orientation: time, person, place and purpose of appointment Memory/Concentration: memory, attention, language, and fund of knowledge intact  Insight/Judgment: good  Interventions:  Conducted a brief chart review Provided empathic reflections and validation Reviewed content from the previous session Employed supportive psychotherapy interventions to facilitate reduced distress and to improve coping skills with identified stressors Employed motivational interviewing skills to assess patient's willingness/desire to adhere to recommended medical treatments and assignments Psychoeducation provided regarding triggers for emotional eating  DSM-5 Diagnosis(es): 307.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 [name] has demonstrated progress in her goal as evidenced by increased awareness of hunger patterns.   Plan: The next appointment will be scheduled in approximately two weeks, which will be via MyChart Video Visit. The next session will focus on working towards the established treatment goal.

## 2019-09-12 ENCOUNTER — Ambulatory Visit (INDEPENDENT_AMBULATORY_CARE_PROVIDER_SITE_OTHER): Payer: BC Managed Care – PPO | Admitting: *Deleted

## 2019-09-12 ENCOUNTER — Other Ambulatory Visit: Payer: Self-pay

## 2019-09-12 DIAGNOSIS — B351 Tinea unguium: Secondary | ICD-10-CM

## 2019-09-12 NOTE — Patient Instructions (Signed)

## 2019-09-12 NOTE — Progress Notes (Signed)
Patient presents today for the 1st laser treatment. Diagnosed with mycotic nail infection by Dr. Cannon Kettle.   Toenail most affected hallux nails bilateral (R>L).  All other systems are negative.  Nails were filed thin. Laser therapy was administered to 1st toenails bilateral and patient tolerated the treatment well. All safety precautions were in place.   She is also using a topical compound as well.   Follow up in 4 weeks for laser # 2.  Picture of nails taken today to document visual progress

## 2019-09-15 ENCOUNTER — Encounter (INDEPENDENT_AMBULATORY_CARE_PROVIDER_SITE_OTHER): Payer: Self-pay | Admitting: Family Medicine

## 2019-09-15 ENCOUNTER — Other Ambulatory Visit: Payer: Self-pay

## 2019-09-15 ENCOUNTER — Ambulatory Visit (INDEPENDENT_AMBULATORY_CARE_PROVIDER_SITE_OTHER): Payer: BC Managed Care – PPO | Admitting: Family Medicine

## 2019-09-15 VITALS — BP 108/71 | HR 75 | Temp 98.3°F | Ht 65.0 in | Wt 186.0 lb

## 2019-09-15 DIAGNOSIS — G8929 Other chronic pain: Secondary | ICD-10-CM

## 2019-09-15 DIAGNOSIS — M545 Low back pain, unspecified: Secondary | ICD-10-CM

## 2019-09-15 DIAGNOSIS — E669 Obesity, unspecified: Secondary | ICD-10-CM

## 2019-09-15 DIAGNOSIS — Z6831 Body mass index (BMI) 31.0-31.9, adult: Secondary | ICD-10-CM

## 2019-09-15 DIAGNOSIS — F3289 Other specified depressive episodes: Secondary | ICD-10-CM

## 2019-09-15 DIAGNOSIS — Z9189 Other specified personal risk factors, not elsewhere classified: Secondary | ICD-10-CM | POA: Diagnosis not present

## 2019-09-15 DIAGNOSIS — R7303 Prediabetes: Secondary | ICD-10-CM | POA: Diagnosis not present

## 2019-09-16 ENCOUNTER — Ambulatory Visit: Payer: BC Managed Care – PPO | Admitting: Sports Medicine

## 2019-09-16 ENCOUNTER — Encounter (INDEPENDENT_AMBULATORY_CARE_PROVIDER_SITE_OTHER): Payer: Self-pay | Admitting: Family Medicine

## 2019-09-16 ENCOUNTER — Encounter: Payer: Self-pay | Admitting: Sports Medicine

## 2019-09-16 DIAGNOSIS — M79672 Pain in left foot: Secondary | ICD-10-CM | POA: Diagnosis not present

## 2019-09-16 DIAGNOSIS — M778 Other enthesopathies, not elsewhere classified: Secondary | ICD-10-CM | POA: Diagnosis not present

## 2019-09-16 DIAGNOSIS — M2021 Hallux rigidus, right foot: Secondary | ICD-10-CM | POA: Diagnosis not present

## 2019-09-16 DIAGNOSIS — M79671 Pain in right foot: Secondary | ICD-10-CM | POA: Diagnosis not present

## 2019-09-16 DIAGNOSIS — B351 Tinea unguium: Secondary | ICD-10-CM

## 2019-09-16 DIAGNOSIS — M2022 Hallux rigidus, left foot: Secondary | ICD-10-CM

## 2019-09-16 DIAGNOSIS — M79674 Pain in right toe(s): Secondary | ICD-10-CM | POA: Diagnosis not present

## 2019-09-16 DIAGNOSIS — M79675 Pain in left toe(s): Secondary | ICD-10-CM

## 2019-09-16 MED ORDER — TRIAMCINOLONE ACETONIDE 10 MG/ML IJ SUSP
10.0000 mg | Freq: Once | INTRAMUSCULAR | Status: AC
  Administered 2019-09-16: 10 mg

## 2019-09-16 NOTE — Progress Notes (Signed)
Subjective: Marisa Hamilton is a 59 y.o. female patient who returns to office for evaluation of R>L foot pain. Patient reports pain again at right big toe joint that has flared back up and admits that she did also have some pain at right hallux nail but had laser which helped with the fungus and thickness and pressure to corners. Patient denies any redness, warmth, swelling, drainage, or signs of infection.  Patient Active Problem List   Diagnosis Date Noted  . GAD (generalized anxiety disorder) 03/02/2018  . PTSD (post-traumatic stress disorder) 03/02/2018  . Chronic venous insufficiency 10/28/2013  . Varicose veins of bilateral lower extremities with other complications [id number]  . Chronic migraine without aura 04/22/2012  . 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 PERSISTENT DISORDER INITIATING/MAINTAINING SLEEP 10/06/2009  . INADEQUATE SLEEP HYGIENE 10/06/2009  . DEPRESSION 10/05/2009  . ALLERGIC RHINITIS 10/05/2009  . 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 MIGRAINES, HX OF 10/05/2009    Current Outpatient Medications on File Prior to Visit  Medication Sig Dispense Refill  . AIMOVIG 140 MG/ML SOAJ Inject 1 mL (140 mg dose) into the skin every 30 (thirty) days.  5  . baclofen (LIORESAL) 10 MG tablet Take 10 mg by mouth. Take one tablet by mouth three times a day as needed    . CALCIUM PO Take 600 mg by mouth 2 (two) times daily.     . chlorproMAZINE (THORAZINE) 25 MG tablet Take 1 tablet by mouth as needed.    . Cholecalciferol (VITAMIN D PO) Take 7,500 Int'l Units by mouth every 3 (three) days.     . clonazePAM (KLONOPIN) 1 MG tablet Take 1 tablet (1 mg total) by mouth at bedtime. 90 tablet 5  . Cyanocobalamin (B-12 PO) Take 5,000 mcg by mouth.    [name] Calcium (STOOL SOFTENER PO) Take by mouth at bedtime as needed.     . Famotidine (PEPCID PO) Take by mouth. Once or twice daily    . FIBER ADULT GUMMIES PO Take 2 each by mouth daily.    [name] HYDROcodone-acetaminophen  (NORCO/VICODIN) 5-325 MG tablet TAKE 1 TABLET BY MOUTH EVERY 6 HOURS as needed for pain FOR 5 DAYS    . Insulin Pen Needle (BD PEN NEEDLE NANO 2ND GEN) 32G X 4 MM MISC 1 Package by Does not apply route daily. 100 each 0  . ketorolac (TORADOL) 60 MG/2ML SOLN injection Inject 60 mg into the muscle once.    [name] Ketorolac Tromethamine (TORADOL IJ) Inject as directed as needed.    . lamoTRIgine (LAMICTAL) 100 MG tablet Take 1 tablet (100 mg total) by mouth daily. 90 tablet 1  . Lasmiditan Succinate (REYVOW) 50 MG TABS Take by mouth daily as needed.     . Liraglutide -Weight Management (SAXENDA) 18 MG/3ML SOPN Inject 0.5 mLs (3 mg total) into the skin daily. 1 pen 0  . MAGNESIUM PO Take 1,350 mg by mouth. 3 pills at night    . meloxicam (MOBIC) 15 MG tablet Take 15 mg by mouth daily.    . metaxalone (SKELAXIN) 800 MG tablet Take 800 mg by mouth 3 (three) times daily as needed.    . metFORMIN (GLUCOPHAGE) 500 MG tablet Take 1 tablet (500 mg total) by mouth daily with breakfast. 30 tablet 0  . methylphenidate (RITALIN) 5 MG tablet Take 1.5 tablets (7.5 mg total) by mouth 2 (two) times daily. 90 tablet 0  . Multiple Vitamins-Minerals (CENTRUM SILVER PO) Take 1 tablet by mouth daily.    [name]  Naldemedine Tosylate (SYMPROIC) 0.2 MG TABS Take 1 tablet by mouth daily.    . NONFORMULARY OR COMPOUNDED ITEM [facility]: Antifungal topical - Terbinafine 3%, Fluconazole 2%, Tea Tree Oil 5%, Urea 10%, Ibuprofen 2%, in DMSO Suspension #58ml. Apply to the affected toenail(s) once (at bedtime) or twice daily. 30 each 11  . omeprazole (PRILOSEC) 40 MG capsule TAKE 1 CAPSULE BY MOUTH 20 minutes BEFORE breakfast every DAY    . ondansetron (ZOFRAN) 8 MG tablet Take by mouth.    . oxyCODONE-acetaminophen (PERCOCET) 10-325 MG tablet Take 1-2 tablets by mouth 3 (three) times daily as needed.    . polyethylene glycol powder (GLYCOLAX/MIRALAX) powder Take by mouth daily as needed.     . predniSONE (DELTASONE) 20 MG tablet  TAKE 3 TABLETS BY MOUTH FOR 3 DAYS, THEN 2 TABLETS BY MOUTH FOR 3 DAYS, THEN1 TABLET BY MOUTH FOR 3 DAYS.    . Probiotic Product (PROBIOTIC PO) Take by mouth daily.    . Rimegepant Sulfate (NURTEC) 75 MG TBDP Take 1 tablet by mouth. Take one tablet every 24 hours as needed for migraine onset    . sertraline (ZOLOFT) 25 MG tablet Take 1 tablet (25 mg total) by mouth 2 (two) times daily. 180 tablet 1  . simethicone (MYLICON) 0000000 MG chewable tablet Chew 125 mg by mouth every 6 (six) hours as needed for flatulence.    [name] tiZANidine (ZANAFLEX) 4 MG tablet Take 1 tablet by mouth as needed.    . traMADol (ULTRAM) 50 MG tablet SMARTSIG:1-2 Tablet(s) By Mouth 2-3 Times Daily PRN    . traZODone (DESYREL) 100 MG tablet Take 1 tablet (100 mg total) by mouth at bedtime. 90 tablet 1  . Vitamin D, Ergocalciferol, (DRISDOL) 1.25 MG (50000 UNIT) CAPS capsule Take 1 capsule (50,000 Units total) by mouth every 3 (three) days. 10 capsule 0  . VITAMIN K PO Take 150 mcg by mouth.     No current facility-administered medications on file prior to visit.    Allergies  Allergen Reactions  . Nsaids Other (See Comments)    Other reaction(s): Abdominal Pain Ulcerative colitis  . Gluten Meal Other (See Comments)    Stomach upset  . Milk-Related Compounds Other (See Comments)    Stomach upset  . Phentermine     Pt stated, "Made my IC flare up"    Objective:  General: Alert and oriented x3 in no acute distress  Dermatology: No open lesions bilateral lower extremities, no webspace macerations, no ecchymosis bilateral, all nails x 10 are short undergoing treatment for nail fungus/laser with no acute signs of infection to nails, + cyst at 2nd toe on left.  Vascular: Dorsalis Pedis and Posterior Tibial pedal pulses palpable, Capillary Fill Time 3 seconds,(+) pedal hair growth bilateral, no edema bilateral lower extremities, Temperature gradient within normal limits.  Neurology: Johney Maine sensation intact via light touch  bilateral.   Musculoskeletal: Mild tenderness with palpation at Right>left foot at 1st MTPJ, Dorsal bone spurs palpable at 1st MTPJs bilateral, No pain to nail at right 1st toe. Limited Range of motion bilateral with most pain at end dorsiflexion on right.  Strength within normal limits in all groups bilateral.   Assessment and Plan: Problem List Items Addressed This Visit    None    Visit Diagnoses    Capsulitis of foot, unspecified laterality    -  Primary   Hallux rigidus of both feet       Right foot pain  Foot pain, bilateral       Nail fungus       Toe pain, bilateral          -Complete examination performed -Re-Discussed treatment options -After oral consent and aseptic prep, injected a mixture containing 1 ml of 2%  plain lidocaine, 1 ml 0.5% plain marcaine, 0.5 ml of kenalog 10 and 0.5 ml of dexamethasone phosphate into Right 1st MTPJ without complication. This is the 1st injection for year 2021. Post-injection care discussed with patient. -Recommend icing PRN -Gave patient copy of fungal culture results; continue with laser -Continue with good supportive shoes daily  -Patient to return to office as needed or sooner if condition worsens.  Landis Martins, DPM

## 2019-09-16 NOTE — Progress Notes (Signed)
Chief Complaint:   OBESITY Marisa Hamilton is here to discuss her progress with her obesity treatment plan along with follow-up of her obesity related diagnoses. Marisa Hamilton is on the Category 2 Plan and states she is following her eating plan approximately 75% of the time. Marisa Hamilton states she is exercising for 0 minutes 0 times per week.  Today's visit was #: 6 Starting weight: 193 lbs Starting date: 06/10/2019 Today's weight: 186 lbs Today's date: 09/15/2019 Total lbs lost to date: 7 lbs Total lbs lost since last in-office visit: 1 lb  Interim History:  Unfortunately, Saxenda was not covered by [employer].  We reviewed Angelly's daily eating patterns. Marisa Hamilton says she has been eating too much at night and admits that this has been an issue for years. She does consume > 25% of her daily calories at and after dinner and she has a strong urge to eat between dinner and sleep onset.  Subjective:   1. Prediabetes [name] has a diagnosis of prediabetes based on her elevated HgA1c and was informed this puts her at greater risk of developing diabetes. She continues to work on diet and exercise to decrease her risk of diabetes. She denies nausea or hypoglycemia.  Lab Results  Component Value Date   HGBA1C 5.7 (H) 06/10/2019   Lab Results  Component Value Date   INSULIN 22.9 06/10/2019   2. Chronic low back pain  Marisa Hamilton has chronic low back pain. This is affecting her ability to exercise.   3. 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  Assessment/Plan:   1. Prediabetes Marisa Hamilton will continue to work on weight loss, exercise, and decreasing simple carbohydrates to help decrease the risk of diabetes.   2. Chronic low back pain Will follow because mobility and pain control are important for weight management.  Will refer to Sports Medicine for OMT and Shakemia will be starting pilates soon.  Orders - Ambulatory referral to Sports Medicine  3. Other  depression, with emotional eating, night eating Behavior modification techniques were discussed today to help [name] deal with her emotional/non-hunger eating behaviors.  We reviewed the Topamax as a potential treatment since Saxenda was not covered by insurance and since she has mood disorder as well as chronic migraines. The dose would likely be between 25-50 mg IR at dinner. I will consult with her specialists in order to make sure that there is no contraindication.  4. At risk for heart disease Marisa Hamilton was given approximately 15 minutes of coronary artery disease prevention counseling today. She is 59 y.o. female and has risk factors for heart disease including obesity. We discussed intensive lifestyle modifications today with an emphasis on specific weight loss instructions and strategies.   Repetitive spaced learning was employed today to elicit superior memory formation and behavioral change.  5. Class 1 obesity with serious comorbidity and body mass index (BMI) of 31.0 to 31.9 in adult, unspecified obesity type Marisa Hamilton  currently in the action stage of change. As such, her goal is to continue with weight loss efforts. She has agreed to the Category 2 Plan and the [place].   Exercise goals: All adults should avoid inactivity. Some physical activity is better than none, and adults who participate in any amount of physical activity gain some health benefits.  Behavioral modification strategies: increasing lean protein intake and increasing water intake.  Marisa Hamilton has agreed to follow-up with our clinic in 2 weeks. She was informed of the importance of  frequent follow-up visits to maximize her success with intensive lifestyle modifications for her multiple health conditions.   Objective:   Blood pressure 108/71, pulse 75, temperature 98.3 F (36.8 C), temperature source Oral, height 5' 5"  (1.651 m), weight 186 lb (84.4 kg), last menstrual period 03/10/2013, SpO2 95 %. Body mass index is  30.95 kg/m.  General: Cooperative, alert, well developed, in no acute distress. HEENT: Conjunctivae and lids unremarkable. Cardiovascular: Regular rhythm.  Lungs: Normal work of breathing. Neurologic: No focal deficits.   Lab Results  Component Value Date   CREATININE 1.00 06/10/2019   BUN 15 06/10/2019   NA 142 06/10/2019   K 4.9 06/10/2019   CL 101 06/10/2019   CO2 25 06/10/2019   Lab Results  Component Value Date   ALT 22 06/10/2019   AST 26 06/10/2019   ALKPHOS 72 06/10/2019   BILITOT 0.2 06/10/2019   Lab Results  Component Value Date   HGBA1C 5.7 (H) 06/10/2019   HGBA1C 5.6 08/04/2013   Lab Results  Component Value Date   INSULIN 22.9 06/10/2019   Lab Results  Component Value Date   TSH 2.550 06/10/2019   Lab Results  Component Value Date   CHOL 187 06/10/2019   HDL 68 06/10/2019   LDLCALC 89 06/10/2019   TRIG 181 (H) 06/10/2019   CHOLHDL 2.7 08/04/2013   Lab Results  Component Value Date   WBC 9.2 06/10/2019   HGB 14.5 06/10/2019   HCT 42.9 06/10/2019   MCV 89 06/10/2019   PLT 288 06/10/2019   Attestation Statements:   Reviewed by clinician on day of visit: allergies, medications, problem list, medical history, surgical history, family history, social history, and previous encounter notes.  I, Water quality scientist, CMA, am acting as Location manager for [employer], DO.  I have reviewed the above documentation for accuracy and completeness, and I agree with the above. Briscoe Deutscher, DO

## 2019-09-17 ENCOUNTER — Other Ambulatory Visit: Payer: Self-pay | Admitting: Sports Medicine

## 2019-09-17 ENCOUNTER — Telehealth: Payer: Self-pay | Admitting: Sports Medicine

## 2019-09-17 MED ORDER — TRAMADOL HCL 50 MG PO TABS: 50.0000 mg | ORAL_TABLET | Freq: Three times a day (TID) | ORAL | 0 refills | Status: AC | PRN

## 2019-09-17 NOTE — Telephone Encounter (Signed)
Pt called stating she received an injection on 09/16/19 w/stover and shes experiencing a lot of pain shje would like to know what she can do for the pain and ift its normal

## 2019-09-17 NOTE — Telephone Encounter (Signed)
Yes sound like her joint flared up after injection. Continue with rest, ice, elevation. I sent some tramadol to her pharmacy to see if this would help -Dr. Chauncey Cruel

## 2019-09-17 NOTE — Telephone Encounter (Signed)
I spoke with pt and informed pt it sounded as if she had a steroid flare of symptoms, and to ice at least 3-4 times a day for 15-20 minutes protecting the skin from the ice with a light cloth, it may last 3-5 days, to rest, ice and elevate. I told pt to take the meloxicam prescribed yesterday by a different doctor and pt states she can not take and was later called by that doctor and told not to take the meloxicam. I discontinued the meloxicam in pt's Med & Orders. I asked pt if she could take oral steroids and pt states she could.

## 2019-09-17 NOTE — Addendum Note (Signed)
Addended by: Harriett Sine D on: 09/17/2019 11:18 AM   Modules accepted: Orders

## 2019-09-17 NOTE — Telephone Encounter (Signed)
Left message with Dr. Leeanne Rio 09/17/2019 11:50am orders.

## 2019-09-17 NOTE — Progress Notes (Signed)
Sent tramdol for pain after injection

## 2019-09-22 ENCOUNTER — Telehealth (INDEPENDENT_AMBULATORY_CARE_PROVIDER_SITE_OTHER): Payer: BC Managed Care – PPO | Admitting: Psychology

## 2019-09-22 DIAGNOSIS — F5089 Other specified eating disorder: Secondary | ICD-10-CM | POA: Diagnosis not present

## 2019-09-23 NOTE — Progress Notes (Signed)
  Office: [phone]  /  Fax: [phone]    Date: Oct 07, 2019   Appointment Start Time: 3:58pm Duration: 30 minutes Provider: Glennie Isle, Psy.D. Type of Session: Individual Therapy  Location of Patient: Home Location of Provider: Provider's Home Type of Contact: Telepsychological Visit via MyChart Video Visit  Session Content: Marisa Hamilton is a 59 y.o. female presenting via [place] Visit for a follow-up appointment to address the previously established treatment goal of increasing coping skills. Today's appointment was a telepsychological visit due to COVID-19. [name] provided verbal consent for today's telepsychological appointment and she is aware she is responsible for securing confidentiality on her end of the session. Prior to proceeding with today's appointment, [name]'s physical location at the time of this appointment was obtained as well a phone number she could be reached at in the event of technical difficulties. Trenity and this provider participated in today's telepsychological service.   This provider conducted a brief check-in. [name] shared she reflected on the triggers previously discussed. She also reported pain impacts her ability to be there for others. Psychoeducation regarding the importance of self-care utilizing the oxygen mask metaphor was provided. Psychoeducation regarding pleasurable activities, including its impact on emotional eating and overall well-being was also provided. [name] was provided with a handout with various options of pleasurable activities, and was encouraged to engage in one activity a day and additional activities as needed when triggered to emotionally eat. [name] agreed. [name] provided verbal consent during today's appointment for this provider to send a handout about pleasurable activities via e-mail. [name] was receptive to today's appointment as evidenced by openness to sharing, responsiveness to feedback, and willingness to engage in pleasurable  activities to assist with coping.  Mental Status Examination:  Appearance: well groomed and appropriate hygiene  Behavior: appropriate to circumstances Mood: euthymic Affect: mood congruent Speech: normal in rate, volume, and tone Eye Contact: appropriate Psychomotor Activity: appropriate Gait: unable to assess Thought Process: linear, logical, and goal directed  Thought Content/Perception: no hallucinations, delusions, bizarre thinking or behavior reported or observed and no evidence of suicidal and homicidal ideation, plan, and intent Orientation: time, person, place and purpose of appointment Memory/Concentration: memory, attention, language, and fund of knowledge intact  Insight/Judgment: good  Interventions:  Conducted a brief chart review Provided empathic reflections and validation Reviewed content from the previous session Employed supportive psychotherapy interventions to facilitate reduced distress and to improve coping skills with identified stressors Employed motivational interviewing skills to assess patient's willingness/desire to adhere to recommended medical treatments and assignments Psychoeducation provided regarding pleasurable activities Psychoeducation provided regarding self-care  DSM-5 Diagnosis(es): 307.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 [name] has demonstrated progress in her goal as evidenced by increased awareness of hunger patterns and increased awareness of triggers for emotional eating. [name] also demonstrates willingness to engage in pleasurable activities.  Plan: The next appointment will be scheduled in three weeks, which will be via MyChart Video Visit. The next session will focus on working towards the established treatment goal.

## 2019-10-06 ENCOUNTER — Encounter (INDEPENDENT_AMBULATORY_CARE_PROVIDER_SITE_OTHER): Payer: Self-pay | Admitting: Family Medicine

## 2019-10-06 ENCOUNTER — Ambulatory Visit (INDEPENDENT_AMBULATORY_CARE_PROVIDER_SITE_OTHER): Payer: BC Managed Care – PPO | Admitting: Family Medicine

## 2019-10-06 ENCOUNTER — Other Ambulatory Visit: Payer: Self-pay

## 2019-10-06 VITALS — BP 116/77 | HR 72 | Temp 98.3°F | Ht 65.0 in | Wt 189.0 lb

## 2019-10-06 DIAGNOSIS — F3289 Other specified depressive episodes: Secondary | ICD-10-CM | POA: Diagnosis not present

## 2019-10-06 DIAGNOSIS — R7303 Prediabetes: Secondary | ICD-10-CM

## 2019-10-06 DIAGNOSIS — R5383 Other fatigue: Secondary | ICD-10-CM | POA: Diagnosis not present

## 2019-10-06 DIAGNOSIS — Z9189 Other specified personal risk factors, not elsewhere classified: Secondary | ICD-10-CM

## 2019-10-06 DIAGNOSIS — Z6831 Body mass index (BMI) 31.0-31.9, adult: Secondary | ICD-10-CM

## 2019-10-06 DIAGNOSIS — E669 Obesity, unspecified: Secondary | ICD-10-CM

## 2019-10-06 MED ORDER — TOPIRAMATE 25 MG PO TABS: 25.0000 mg | ORAL_TABLET | Freq: Two times a day (BID) | ORAL | 0 refills | Status: DC

## 2019-10-07 ENCOUNTER — Telehealth (INDEPENDENT_AMBULATORY_CARE_PROVIDER_SITE_OTHER): Payer: BC Managed Care – PPO | Admitting: Psychology

## 2019-10-07 ENCOUNTER — Telehealth: Payer: Self-pay | Admitting: Psychiatry

## 2019-10-07 ENCOUNTER — Ambulatory Visit: Payer: BC Managed Care – PPO | Admitting: Family Medicine

## 2019-10-07 ENCOUNTER — Encounter: Payer: Self-pay | Admitting: Family Medicine

## 2019-10-07 DIAGNOSIS — M25571 Pain in right ankle and joints of right foot: Secondary | ICD-10-CM

## 2019-10-07 DIAGNOSIS — G8929 Other chronic pain: Secondary | ICD-10-CM

## 2019-10-07 DIAGNOSIS — M5441 Lumbago with sciatica, right side: Secondary | ICD-10-CM | POA: Diagnosis not present

## 2019-10-07 DIAGNOSIS — M25579 Pain in unspecified ankle and joints of unspecified foot: Secondary | ICD-10-CM | POA: Insufficient documentation

## 2019-10-07 DIAGNOSIS — M545 Low back pain, unspecified: Secondary | ICD-10-CM | POA: Insufficient documentation

## 2019-10-07 DIAGNOSIS — F5089 Other specified eating disorder: Secondary | ICD-10-CM | POA: Diagnosis not present

## 2019-10-07 LAB — CBC WITH DIFFERENTIAL/PLATELET
Basophils Absolute: 0.1 10*3/uL (ref 0.0–0.2)
Basos: 1 %
EOS (ABSOLUTE): 0.3 10*3/uL (ref 0.0–0.4)
Eos: 3 %
Hemoglobin: 14 g/dL (ref 11.1–15.9)
Immature Grans (Abs): 0 10*3/uL (ref 0.0–0.1)
Immature Granulocytes: 0 %
Lymphocytes Absolute: 2.3 10*3/uL (ref 0.7–3.1)
Lymphs: 26 %
MCH: 30 pg (ref 26.6–33.0)
MCHC: 33.8 g/dL (ref 31.5–35.7)
MCV: 89 fL (ref 79–97)
Monocytes Absolute: 0.6 10*3/uL (ref 0.1–0.9)
Monocytes: 6 %
Neutrophils Absolute: 5.7 10*3/uL (ref 1.4–7.0)
Neutrophils: 64 %
Platelets: 265 10*3/uL (ref 150–450)
RBC: 4.66 x10E6/uL (ref 3.77–5.28)
RDW: 13.4 % (ref 11.7–15.4)
WBC: 9 10*3/uL (ref 3.4–10.8)

## 2019-10-07 LAB — COMPREHENSIVE METABOLIC PANEL
ALT: 23 IU/L (ref 0–32)
AST: 24 IU/L (ref 0–40)
Albumin/Globulin Ratio: 1.7 (ref 1.2–2.2)
Albumin: 4.2 g/dL (ref 3.8–4.9)
Alkaline Phosphatase: 67 IU/L (ref 48–121)
BUN/Creatinine Ratio: 38 — ABNORMAL HIGH (ref 12–28)
BUN: 28 mg/dL — ABNORMAL HIGH (ref 8–27)
Bilirubin Total: <0.2 mg/dL (ref 0.0–1.2)
CO2: 26 mmol/L (ref 20–29)
Calcium: 9.3 mg/dL (ref 8.7–10.3)
Chloride: 101 mmol/L (ref 96–106)
Creatinine, Ser: 0.73 mg/dL (ref 0.57–1.00)
GFR calc Af Amer: 104 mL/min/{1.73_m2} (ref >59)
GFR calc non Af Amer: 90 mL/min/{1.73_m2} (ref >59)
Globulin, Total: 2.5 g/dL (ref 1.5–4.5)
Glucose: 87 mg/dL (ref 65–99)
Potassium: 5 mmol/L (ref 3.5–5.2)
Sodium: 139 mmol/L (ref 134–144)
Total Protein: 6.7 g/dL (ref 6.0–8.5)

## 2019-10-07 LAB — ANEMIA PANEL
Ferritin: 33 ng/mL (ref 15–150)
Folate, Hemolysate: 584 ng/mL
Folate, RBC: 1411 ng/mL (ref >498)
Hematocrit: 41.4 % (ref 34.0–46.6)
Iron Saturation: 20 % (ref 15–55)
Iron: 68 ug/dL (ref 27–159)
Retic Ct Pct: 1.8 % (ref 0.6–2.6)
Total Iron Binding Capacity: 346 ug/dL (ref 250–450)
UIBC: 278 ug/dL (ref 131–425)
Vitamin B-12: 1907 pg/mL — ABNORMAL HIGH (ref 232–1245)

## 2019-10-07 LAB — HEMOGLOBIN A1C
Est. average glucose Bld gHb Est-mCnc: 114 mg/dL
Hgb A1c MFr Bld: 5.6 % (ref 4.8–5.6)

## 2019-10-07 LAB — VITAMIN D 25 HYDROXY (VIT D DEFICIENCY, FRACTURES): Vit D, 25-Hydroxy: 65.9 ng/mL (ref 30.0–100.0)

## 2019-10-07 MED ORDER — VITAMIN D (ERGOCALCIFEROL) 1.25 MG (50000 UNIT) PO CAPS
50000.0000 [IU] | ORAL_CAPSULE | ORAL | 0 refills | Status: DC
Start: 2019-10-07 — End: 2019-12-23

## 2019-10-07 MED ORDER — DULOXETINE HCL 20 MG PO CPEP
20.0000 mg | ORAL_CAPSULE | Freq: Every day | ORAL | 0 refills | Status: DC
Start: 2019-10-07 — End: 2019-10-16

## 2019-10-07 NOTE — Progress Notes (Signed)
Chief Complaint:   OBESITY Marisa Hamilton is here to discuss her progress with her obesity treatment plan along with follow-up of her obesity related diagnoses. [name] is on the Category 2 Plan and the [place] and states she is following her eating plan approximately 70% of the time. [name] states she is doing pilates for 50 minutes 3 times per week.  Today's visit was #: 7 Starting weight: 193 lbs Starting date: 06/10/2019 Today's weight: 189 lbs Today's date: 10/06/2019 Total lbs lost to date: 4 lbs Total lbs lost since last in-office visit: 0  Interim History: Mardi reports that her back is improving.  Topamax was approved for her to take by her psychiatrist and neurologist.  She says she is frustrated with her lack of weight loss.  Subjective:   1. Prediabetes Marisa Hamilton has a diagnosis of prediabetes based on her elevated HgA1c and was informed this puts her at greater risk of developing diabetes. She continues to work on diet and exercise to decrease her risk of diabetes. She denies nausea or hypoglycemia.  Lab Results  Component Value Date   HGBA1C 5.7 (H) 06/10/2019   Lab Results  Component Value Date   INSULIN 22.9 06/10/2019   2. Other fatigue Marisa Hamilton reports being more fatigued than usual.  3. Other depression, with emotional eating Marisa Hamilton is struggling with emotional eating and using food for comfort to the extent that it is negatively impacting her health. She has been working on behavior modification techniques to help reduce her emotional eating and has been successful. She shows no sign of suicidal or homicidal ideations.  4. At risk for constipation Marisa Hamilton is at increased risk for constipation due to inadequate water intake, changes in diet, and/or use of medications such as GLP1 agonists. Marisa Hamilton denies hard, infrequent stools currently.   Assessment/Plan:   1. Prediabetes Marisa Hamilton will continue to work on weight loss, exercise, and decreasing simple carbohydrates to  help decrease the risk of diabetes.   2. Other fatigue Will check labs today, as per below.  Orders - Comprehensive metabolic panel - CBC with Differential/Platelet - Hemoglobin A1c - VITAMIN D 25 Hydroxy (Vit-D Deficiency, Fractures) - Anemia panel  3. Other depression, with emotional eating Behavior modification techniques were discussed today to help [name] deal with her emotional/non-hunger eating behaviors.  Orders and follow up as documented in patient record.  Marisa Hamilton will start Topamax 25 mg daily at dinner.  4. At risk for constipation Marisa Hamilton was given approximately 15 minutes of counseling today regarding prevention of constipation. She was encouraged to increase water and fiber intake.   5. Class 1 obesity with serious comorbidity and body mass index (BMI) of 31.0 to 31.9 in adult, unspecified obesity type Marisa Hamilton is currently in the action stage of change. As such, her goal is to continue with weight loss efforts. She has agreed to the Category 2 Plan.   Exercise goals: As is.  Behavioral modification strategies: increasing lean protein intake, decreasing simple carbohydrates, increasing vegetables and increasing high fiber foods.  Marisa Hamilton has agreed to follow-up with our clinic in 3 weeks. She was informed of the importance of frequent follow-up visits to maximize her success with intensive lifestyle modifications for her multiple health conditions.   Marisa Hamilton was informed we would discuss her lab results at her next visit unless there is a critical issue that needs to be addressed sooner. Marisa Hamilton agreed to keep her next visit at the agreed upon time to discuss these results.  Objective:  Blood pressure 116/77, pulse 72, temperature 98.3 F (36.8 C), temperature source Oral, height 5' 5"  (1.651 m), weight 189 lb (85.7 kg), last menstrual period 03/10/2013, SpO2 92 %. Body mass index is 31.45 kg/m.  General: Cooperative, alert, well developed, in no acute distress. HEENT:  Conjunctivae and lids unremarkable. Cardiovascular: Regular rhythm.  Lungs: Normal work of breathing. Neurologic: No focal deficits.   Lab Results  Component Value Date   CREATININE 1.00 06/10/2019   BUN 15 06/10/2019   NA 142 06/10/2019   K 4.9 06/10/2019   CL 101 06/10/2019   CO2 25 06/10/2019   Lab Results  Component Value Date   ALT 22 06/10/2019   AST 26 06/10/2019   ALKPHOS 72 06/10/2019   BILITOT 0.2 06/10/2019   Lab Results  Component Value Date   HGBA1C 5.7 (H) 06/10/2019   HGBA1C 5.6 08/04/2013   Lab Results  Component Value Date   INSULIN 22.9 06/10/2019   Lab Results  Component Value Date   TSH 2.550 06/10/2019   Lab Results  Component Value Date   CHOL 187 06/10/2019   HDL 68 06/10/2019   LDLCALC 89 06/10/2019   TRIG 181 (H) 06/10/2019   CHOLHDL 2.7 08/04/2013   Lab Results  Component Value Date   WBC 9.2 06/10/2019   HGB 14.5 06/10/2019   HCT 42.9 06/10/2019   MCV 89 06/10/2019   PLT 288 06/10/2019   Attestation Statements:   Reviewed by clinician on day of visit: allergies, medications, problem list, medical history, surgical history, family history, social history, and previous encounter notes.  I, Water quality scientist, CMA, am acting as Location manager for [employer], DO.  I have reviewed the above documentation for accuracy and completeness, and I agree with the above. Briscoe Deutscher, DO

## 2019-10-07 NOTE — Assessment & Plan Note (Signed)
Foot pain.  Patient has what appears to be more of a functional leg length discrepancy and is not anatomical.  Do not feel that patient needs significant amount of changes to her shoes otherwise.  Discussed icing regimen and home exercises, discussed proper shoes even in the house.  Patient will increase activity slowly.  Follow-up again in 4 to 8 weeks

## 2019-10-07 NOTE — Telephone Encounter (Signed)
No she can't switch to duloxetine.  She already tried that in the past and didn't tolerate it.

## 2019-10-07 NOTE — Patient Instructions (Addendum)
Good to see you.  Ice 20 minutes 2 times daily. Usually after activity and before bed. Exercises 3 times a week.  Stop zoloft Cymbalta 44m Once weekly vitamin D for 12 weeks.  Tart cherry [id number] night See me again in 4-5 weeks

## 2019-10-07 NOTE — Progress Notes (Signed)
[place] Phone: [phone] Subjective:   Marisa Hamilton, am serving as a scribe for Dr. Hulan Saas. This visit occurred during the SARS-CoV-2 public health emergency.  Safety protocols were in place, including screening questions prior to the visit, additional usage of staff PPE, and extensive cleaning of exam room while observing appropriate contact time as indicated for disinfecting solutions.   I'm seeing this patient by the request  of:  Marisa Small, MD  CC: Right-sided low back pain  ENI:[id number]  Marisa Hamilton is a 59 y.o. female coming in with complaint of right sided lower back pain primarily in SI joint. Was told that she has one leg longer than the other. Had issues with left side of back in [place]. Does Pilates to help with back pain. Pain increases with lumbar flexion. Has tried heel lifts to help with leg length and has shoes. Is Hamilton longer taking medication for pain.      Past Medical History:  Diagnosis Date  . Anxiety   . Back pain   . CFS (chronic fatigue syndrome)   . Concussion 03-17-17  . Constipation   . Cystitis, interstitial    HAD BLADDER SX FOR SAME  . Depression   . Fainting spell   . Fibromyalgia   . Food allergy   . Foot pain   . GERD (gastroesophageal reflux disease)   . Headache(784.0)   . IBS (irritable bowel syndrome)    TAKES PROBIOTICS  . Insomnia   . Lactose intolerance   . Leg pain   . Migraine   . MVP (mitral valve prolapse)   . Nausea   . Neck pain   . PONV (postoperative nausea and vomiting)   . Restless leg syndrome   . Stomach ache   . Ulcerative colitis ([place])   . Varicose veins    Past Surgical History:  Procedure Laterality Date  . ABDOMINOPLASTY  4/14  . BLADDER SURGERY     FOR IC  . BREAST BIOPSY Left 02-26-12   negative  . BREAST REDUCTION SURGERY  10/10/2011   Procedure: MAMMARY REDUCTION  (BREAST);  Surgeon: Charlene Brooke,  MD;  Location: [facility];  Service: Plastics;  Laterality: Bilateral;  . LASIK    . NASAL SEPTUM SURGERY    . REDUCTION MAMMAPLASTY Bilateral   . VASCULAR SURGERY     VARICOSE VEINS   Social History   Socioeconomic History  . Marital status: Married    Spouse name: Therapist, sports  . Number of children: Not on file  . Years of education: Not on file  . Highest education level: Not on file  Occupational History  . Occupation: Retired Wellsite geologist  Tobacco Use  . Smoking status: Never Smoker  . Smokeless tobacco: Never Used  Substance and Sexual Activity  . Alcohol use: Yes    Alcohol/week: 1.0 standard drinks    Types: 1 Standard drinks or equivalent per week  . Drug use: Hamilton  . Sexual activity: Yes    Partners: Male    Birth control/protection: Other-see comments    Comment: husband vasectomy  Other Topics Concern  . Not on file  Social History Narrative  . Not on file   Social Determinants of Health   Financial Resource Strain:   . Difficulty of Paying Living Expenses:   Food Insecurity:   . Worried About Charity fundraiser in the Last Year:   .  Ran Out of Food in the Last Year:   Transportation Needs:   . Film/video editor (Medical):   [name] Lack of Transportation (Non-Medical):   Physical Activity:   . Days of Exercise per Week:   . Minutes of Exercise per Session:   Stress:   . Feeling of Stress :   Social Connections:   . Frequency of Communication with Friends and Family:   . Frequency of Social Gatherings with Friends and Family:   . Attends Religious Services:   . Active Member of Clubs or Organizations:   . Attends Archivist Meetings:   [name] Marital Status:    Allergies  Allergen Reactions  . Nsaids Other (See Comments)    Other reaction(s): Abdominal Pain Ulcerative colitis  . Gluten Meal Other (See Comments)    Stomach upset  . Milk-Related Compounds Other (See Comments)    Stomach upset  . Phentermine     Pt  stated, "Made my IC flare up"   Family History  Problem Relation Age of Onset  . Osteoporosis Mother   . Atrial fibrillation Mother   . Heart disease Mother   . Stroke Mother   . Depression Mother   . Anxiety disorder Mother   . Obesity Mother   . Diabetes Father   . Hypertension Father   . Depression Father   . Obesity Father   . Breast cancer Maternal Grandmother   . Cancer Maternal Grandmother        ovarian  . Diabetes Paternal Grandmother   . Migraines Brother        CHRONIC       Current Outpatient Medications (Analgesics):  .  Ketorolac Tromethamine (TORADOL IJ), Inject as directed as needed. [name] Succinate (REYVOW) 50 MG TABS, Take by mouth daily as needed.  .  Rimegepant Sulfate (NURTEC) 75 MG TBDP, Take 1 tablet by mouth. Take one tablet every 24 hours as needed for migraine onset  Current Outpatient Medications (Hematological):  [name]  Cyanocobalamin (B-12 PO), Take 5,000 mcg by mouth.  Current Outpatient Medications (Other):  .  baclofen (LIORESAL) 10 MG tablet, Take 10 mg by mouth. Take one tablet by mouth three times a day as needed .  CALCIUM PO, Take 600 mg by mouth 2 (two) times daily.  .  chlorproMAZINE (THORAZINE) 25 MG tablet, Take 1 tablet by mouth as needed. .  Cholecalciferol (VITAMIN D PO), Take 7,500 Int'l Units by mouth every 3 (three) days.  .  clonazePAM (KLONOPIN) 1 MG tablet, Take 1 tablet (1 mg total) by mouth at bedtime. [name] Calcium (STOOL SOFTENER PO), Take by mouth at bedtime as needed.  .  Famotidine (PEPCID PO), Take by mouth. Once or twice daily .  FIBER ADULT GUMMIES PO, Take 2 each by mouth daily. [name]  lamoTRIgine (LAMICTAL) 100 MG tablet, Take 1 tablet (100 mg total) by mouth daily. [name]  MAGNESIUM PO, Take 1,350 mg by mouth. 3 pills at night .  Multiple Vitamins-Minerals (CENTRUM SILVER PO), Take 1 tablet by mouth daily. [name] (SYMPROIC) 0.2 MG TABS, Take 1 tablet by mouth daily. .  NONFORMULARY OR  COMPOUNDED ITEM, [facility]: Antifungal topical - Terbinafine 3%, Fluconazole 2%, Tea Tree Oil 5%, Urea 10%, Ibuprofen 2%, in DMSO Suspension #55m. Apply to the affected toenail(s) once (at bedtime) or twice daily. .[name] omeprazole (PRILOSEC) 40 MG capsule, TAKE 1 CAPSULE BY MOUTH 20 minutes BEFORE breakfast every DAY .  ondansetron (ZOFRAN)  8 MG tablet, Take by mouth. .  polyethylene glycol powder (GLYCOLAX/MIRALAX) powder, Take by mouth daily as needed.  .  Probiotic Product (PROBIOTIC PO), Take by mouth daily. .  sertraline (ZOLOFT) 25 MG tablet, Take 1 tablet (25 mg total) by mouth 2 (two) times daily. .  simethicone (MYLICON) 272 MG chewable tablet, Chew 125 mg by mouth every 6 (six) hours as needed for flatulence. [name]  tiZANidine (ZANAFLEX) 4 MG tablet, Take 1 tablet by mouth as needed. .  topiramate (TOPAMAX) 25 MG tablet, Take 1 tablet (25 mg total) by mouth 2 (two) times daily. .  traZODone (DESYREL) 100 MG tablet, Take 1 tablet (100 mg total) by mouth at bedtime. .  Vitamin D, Ergocalciferol, (DRISDOL) 1.25 MG (50000 UNIT) CAPS capsule, Take 1 capsule (50,000 Units total) by mouth every 3 (three) days. [name]  VITAMIN K PO, Take 150 mcg by mouth. .  DULoxetine (CYMBALTA) 20 MG capsule, Take 1 capsule (20 mg total) by mouth daily. .  Vitamin D, Ergocalciferol, (DRISDOL) 1.25 MG (50000 UNIT) CAPS capsule, Take 1 capsule (50,000 Units total) by mouth every 7 (seven) days.   Reviewed prior external information including notes and imaging from  primary care provider As well as notes that were available from care everywhere and other healthcare systems.  Past medical history, social, surgical and family history all reviewed in electronic medical record.  Hamilton pertanent information unless stated regarding to the chief complaint.   Review of Systems:  Hamilton headache, visual changes, nausea, vomiting, diarrhea, constipation, dizziness, abdominal pain, skin rash, fevers, chills, night sweats,  weight loss, swollen lymph nodes, body aches, joint swelling, chest pain, shortness of breath, mood changes. POSITIVE muscle aches  Objective  Blood pressure 110/64, pulse 84, height 5' 5"  (1.651 m), weight 191 lb (86.6 kg), last menstrual period 03/10/2013, SpO2 98 %.   General: Hamilton apparent distress alert and oriented x3 mood and affect normal, dressed appropriately.  HEENT: Pupils equal, extraocular movements intact  Respiratory: Patient's speak in full sentences and does not appear short of breath  Cardiovascular: Hamilton lower extremity edema, non tender, Hamilton erythema  Neuro: Cranial nerves II through XII are intact, neurovascularly intact in all extremities with 2+ DTRs and 2+ pulses.  Gait normal with good balance and coordination.  MSK: Patient has a functional leg length discrepancy but not anatomical.  Patient does have some tightness with FABER test.  Diffuse tenderness to palpation in the paraspinal musculature right greater than left.  Patient does have some mild tightness also with straight leg test but Hamilton true radicular symptoms.  Lacks the last 5 degrees of extension.    [id number]; 15 additional minutes spent for Therapeutic exercises as stated in above notes.  This included exercises focusing on stretching, strengthening, with significant focus on eccentric aspects.   Long term goals include an improvement in range of motion, strength, endurance as well as avoiding reinjury. Patient's frequency would include in 1-2 times a day, 3-5 times a week for a duration of 6-12 weeks. Low back exercises that included:  Pelvic tilt/bracing instruction to focus on control of the pelvic girdle and lower abdominal muscles  Glute strengthening exercises, focusing on proper firing of the glutes without engaging the low back muscles Proper stretching techniques for maximum relief for the hamstrings, hip flexors, low back and some rotation where tolerated  Low back exercises that included:  Pelvic tilt/bracing  instruction to focus on control of the pelvic girdle and lower abdominal muscles  Glute  strengthening exercises, focusing on proper firing of the glutes without engaging the low back muscles Proper stretching techniques for maximum relief for the hamstrings, hip flexors, low back and some rotation where tolerated    Proper technique shown and discussed handout in great detail with ATC.  All questions were discussed and answered.   Impression and Recommendations:     This case required medical decision making of moderate complexity. The above documentation has been reviewed and is accurate and complete Marisa Pulley, DO       Note: This dictation was prepared with Dragon dictation along with smaller phrase technology. Any transcriptional errors that result from this process are unintentional.

## 2019-10-07 NOTE — Telephone Encounter (Signed)
Patient called and said that she saw dr. Hulan Saas today for lower back pain. He suggested that she stop taking zoloft and take cymbalta in place of that to help with her back pain. She told him that she would not change any medicines without talking to you about it. Please give her a call at [phone]

## 2019-10-07 NOTE — Assessment & Plan Note (Signed)
Patient does have more of a low back pain.  Discussed with patient in great length.  Discussed icing regimen and home exercise, we discussed with patient about her facet arthropathy.  Discussed Cymbalta and discontinued the sertraline.  I do believe that patient's other comorbidities are likely contributing.  Discussed the muscle relaxers on a more regular basis.  Discussed icing regimen.  Follow-up again in 4 to 8 weeks

## 2019-10-08 NOTE — Telephone Encounter (Signed)
LM with information and to call back with further questions or concerns

## 2019-10-10 ENCOUNTER — Other Ambulatory Visit: Payer: BC Managed Care – PPO

## 2019-10-14 NOTE — Progress Notes (Signed)
  Office: [phone]  /  Fax: [phone]    Date: October 28, 2019   Appointment Start Time: 3:58pm Duration: 30 minutes Provider: Glennie Isle, Psy.D. Type of Session: Individual Therapy  Location of Patient: Home Location of Provider: Provider's Home Type of Contact: Telepsychological Visit via MyChart Video Visit  Session Content: Marisa Hamilton is a 59 y.o. female presenting via [place] Visit for a follow-up appointment to address the previously established treatment goal of increasing coping skills. Today's appointment was a telepsychological visit due to COVID-19. Marisa Hamilton provided verbal consent for today's telepsychological appointment and she is aware she is responsible for securing confidentiality on her end of the session. Prior to proceeding with today's appointment, Marisa Hamilton's physical location at the time of this appointment was obtained as well a phone number she could be reached at in the event of technical difficulties. Marisa Hamilton and this provider participated in today's telepsychological service.   This provider conducted a brief check-in. Marisa Hamilton reported engaging in pleasurable activities. She discussed the aforementioned has improved well-being, including emotional eating. Positive reinforcement was provided. She described challenges in the evening as she is tired but it is too early to go to bed resulting in comfort eating. Psychoeducation regarding mindfulness was provided to assist with coping. A handout was provided to Marisa Hamilton with further information regarding mindfulness, including exercises. This provider also explained the benefit of mindfulness as it relates to emotional eating. Marisa Hamilton was encouraged to engage in the provided exercises between now and the next appointment with this provider. Marisa Hamilton agreed. During today's appointment, Marisa Hamilton was led through a mindfulness exercise involving her senses. Marisa Hamilton provided verbal consent during today's appointment for this provider to send a  handout about mindfulness via e-mail. Marisa Hamilton was receptive to today's appointment as evidenced by openness to sharing, responsiveness to feedback, and willingness to engage in mindfulness exercises to assist with coping.  Mental Status Examination:  Appearance: well groomed and appropriate hygiene  Behavior: appropriate to circumstances Mood: euthymic Affect: mood congruent Speech: normal in rate, volume, and tone Eye Contact: appropriate Psychomotor Activity: appropriate Gait: unable to assess Thought Process: linear, logical, and goal directed  Thought Content/Perception: no hallucinations, delusions, bizarre thinking or behavior reported or observed and no evidence of suicidal and homicidal ideation, plan, and intent Orientation: time, person, place, and purpose of appointment Memory/Concentration: memory, attention, language, and fund of knowledge intact  Insight/Judgment: good  Interventions:  Conducted a brief chart review Provided empathic reflections and validation Reviewed content from the previous session Provided positive reinforcement Employed supportive psychotherapy interventions to facilitate reduced distress and to improve coping skills with identified stressors Employed motivational interviewing skills to assess patient's willingness/desire to adhere to recommended medical treatments and assignments Psychoeducation provided regarding mindfulness Engaged patient in mindfulness exercise(s) Employed acceptance and commitment interventions to emphasize mindfulness and acceptance without struggle  DSM-5 Diagnosis(es): 307.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 Marisa Hamilton has demonstrated progress in her goal as evidenced by increased awareness of hunger patterns and increased awareness of  triggers for emotional eating. Marisa Hamilton also continues to demonstrate willingness to engage in learned skill(s).  Plan: The next appointment will be scheduled in approximately two weeks, which will be via MyChart Video Visit. The next session will focus on working towards the established treatment goal.

## 2019-10-16 ENCOUNTER — Encounter: Payer: Self-pay | Admitting: Psychiatry

## 2019-10-16 ENCOUNTER — Ambulatory Visit (INDEPENDENT_AMBULATORY_CARE_PROVIDER_SITE_OTHER): Payer: BC Managed Care – PPO | Admitting: Psychiatry

## 2019-10-16 ENCOUNTER — Other Ambulatory Visit: Payer: Self-pay

## 2019-10-16 DIAGNOSIS — F331 Major depressive disorder, recurrent, moderate: Secondary | ICD-10-CM

## 2019-10-16 DIAGNOSIS — F411 Generalized anxiety disorder: Secondary | ICD-10-CM | POA: Diagnosis not present

## 2019-10-16 DIAGNOSIS — F5105 Insomnia due to other mental disorder: Secondary | ICD-10-CM

## 2019-10-16 DIAGNOSIS — F99 Mental disorder, not otherwise specified: Secondary | ICD-10-CM

## 2019-10-16 DIAGNOSIS — F900 Attention-deficit hyperactivity disorder, predominantly inattentive type: Secondary | ICD-10-CM

## 2019-10-16 MED ORDER — MODAFINIL 100 MG PO TABS: 100.0000 mg | ORAL_TABLET | Freq: Every day | ORAL | 0 refills | Status: DC

## 2019-10-16 NOTE — Progress Notes (Signed)
Marisa Hamilton [id number] Dec 19, 1960 59 y.o.  Subjective:   Patient ID:  Marisa Hamilton is a 59 y.o. (DOB [birth year]) female.  Chief Complaint:  Chief Complaint  Patient presents with  . Follow-up  . Depression  . Anxiety  . Medication Problem    Depression        Associated symptoms include headaches.  Associated symptoms include no decreased concentration and no suicidal ideas.  Marisa Hamilton presents to the office today for follow-up of depression and anxiety  seen in August 2020.  No meds were changed. Remains on sertrline 37.5 mg, lamotrigine, clonazepam 38m HS.     Last seen 08/18/2019 and was on sertraline 50 mg in addition to the other medicines noted. Continued cognitive complaints consistent with an ADD pattern.  Uncertain as to whether she actually had this as a child or perhaps as a complication of chronic migraine or depression.  She is having disability related and would like to try medication for it. Plan:Ok trial low dose Ritalin 2.5-7.559m BID   10/16/2019 appointment with the following noted: Couldn't tolerate the Ritalin bc of bladder problems at the lowest dose. In a lot of physical pain since here and severe food poisoning since here. Also quite a few HA and back pain so more negative days. Frustrated with this. Started infusions for HA.  Off Aimovig and more HA so far.    Patient reports more depression with pain and other health problems which are worse.  Patient denies any recent difficulty with anxiety except with mother.  Patient denies difficulty with sleep initiation or maintenance.8 hours lately but variable. Benefit trazodone.   Denies appetite disturbance.  Patient reports that energy and motivation have been good.    Patient denies any suicidal ideation.  Failed psychiatric medication trials include Lexapro, lithium, amitriptyline, Emsam, duloxetine, Pristiq,  Wellbutrin, fluoxetine,  venlafaxine, valproic acid,buspirone, Abilify,  clonidine, Topamax, Depakote, Deplin, carbamazepine.   Sleep med failures include amitriptyline, mirtazapine, trazodone, doxepin which caused nightmares, Ambien, gabapentin, Sonata, cyclobenzaprine, Xanax.  Son OCD on Zoloft  Review of Systems:  Review of Systems  Musculoskeletal: Positive for back pain.  Neurological: Positive for headaches. Negative for dizziness, tremors and weakness.  Psychiatric/Behavioral: Positive for depression. Negative for agitation, behavioral problems, confusion, decreased concentration, dysphoric mood, hallucinations, self-injury, sleep disturbance and suicidal ideas. The patient is nervous/anxious. The patient is not hyperactive.     Medications: I have reviewed the patient's current medications.  Current Outpatient Medications  Medication Sig Dispense Refill  . baclofen (LIORESAL) 10 MG tablet Take 10 mg by mouth. Take one tablet by mouth three times a day as needed    . CALCIUM PO Take 600 mg by mouth 2 (two) times daily.     . chlorproMAZINE (THORAZINE) 25 MG tablet Take 1 tablet by mouth as needed.    . Cholecalciferol (VITAMIN D PO) Take 7,500 Int'l Units by mouth every 3 (three) days.     . clonazePAM (KLONOPIN) 1 MG tablet Take 1 tablet (1 mg total) by mouth at bedtime. 90 tablet 5  . Cyanocobalamin (B-12 PO) Take 5,000 mcg by mouth.    . Mariane Baumgartenalcium (STOOL SOFTENER PO) Take by mouth at bedtime as needed.     . Famotidine (PEPCID PO) Take by mouth. Once or twice daily    . FIBER ADULT GUMMIES PO Take 2 each by mouth daily.    . [name] Kitchenetorolac Tromethamine (TORADOL IJ) Inject as directed as needed.    . lamoTRIgine (  LAMICTAL) 100 MG tablet Take 1 tablet (100 mg total) by mouth daily. 90 tablet 1  . Lasmiditan Succinate (REYVOW) 50 MG TABS Take by mouth daily as needed.     [name] MAGNESIUM PO Take 1,350 mg by mouth. 3 pills at night    . Multiple Vitamins-Minerals (CENTRUM SILVER PO) Take 1 tablet by mouth daily.    Marisa Hamilton Tosylate (SYMPROIC) 0.2  MG TABS Take 1 tablet by mouth daily.    . NONFORMULARY OR COMPOUNDED ITEM [facility]: Antifungal topical - Terbinafine 3%, Fluconazole 2%, Tea Tree Oil 5%, Urea 10%, Ibuprofen 2%, in DMSO Suspension #40m. Apply to the affected toenail(s) once (at bedtime) or twice daily. 30 each 11  . omeprazole (PRILOSEC) 40 MG capsule TAKE 1 CAPSULE BY MOUTH 20 minutes BEFORE breakfast every DAY    . ondansetron (ZOFRAN) 8 MG tablet Take by mouth.    . polyethylene glycol powder (GLYCOLAX/MIRALAX) powder Take by mouth daily as needed.     . Probiotic Product (PROBIOTIC PO) Take by mouth daily.    . Rimegepant Sulfate (NURTEC) 75 MG TBDP Take 1 tablet by mouth. Take one tablet every 24 hours as needed for migraine onset    . sertraline (ZOLOFT) 25 MG tablet Take 1 tablet (25 mg total) by mouth 2 (two) times daily. 180 tablet 1  . simethicone (MYLICON) 1009MG chewable tablet Chew 125 mg by mouth every 6 (six) hours as needed for flatulence.    .[name] KitchentiZANidine (ZANAFLEX) 4 MG tablet Take 1 tablet by mouth as needed.    . topiramate (TOPAMAX) 25 MG tablet Take 1 tablet (25 mg total) by mouth 2 (two) times daily. 30 tablet 0  . traZODone (DESYREL) 100 MG tablet Take 1 tablet (100 mg total) by mouth at bedtime. 90 tablet 1  . Vitamin D, Ergocalciferol, (DRISDOL) 1.25 MG (50000 UNIT) CAPS capsule Take 1 capsule (50,000 Units total) by mouth every 3 (three) days. 10 capsule 0  . Vitamin D, Ergocalciferol, (DRISDOL) 1.25 MG (50000 UNIT) CAPS capsule Take 1 capsule (50,000 Units total) by mouth every 7 (seven) days. 12 capsule 0  . VITAMIN K PO Take 150 mcg by mouth.     No current facility-administered medications for this visit.    Medication Side Effects: None  Allergies:  Allergies  Allergen Reactions  . Nsaids Other (See Comments)    Other reaction(s): Abdominal Pain Ulcerative colitis  . Gluten Meal Other (See Comments)    Stomach upset  . Milk-Related Compounds Other (See Comments)    Stomach  upset  . Phentermine     Pt stated, "Made my IC flare up"    Past Medical History:  Diagnosis Date  . Anxiety   . Back pain   . CFS (chronic fatigue syndrome)   . Concussion 03-17-17  . Constipation   . Cystitis, interstitial    HAD BLADDER SX FOR SAME  . Depression   . Fainting spell   . Fibromyalgia   . Food allergy   . Foot pain   . GERD (gastroesophageal reflux disease)   . Headache(784.0)   . IBS (irritable bowel syndrome)    TAKES PROBIOTICS  . Insomnia   . Lactose intolerance   . Leg pain   . Migraine   . MVP (mitral valve prolapse)   . Nausea   . Neck pain   . PONV (postoperative nausea and vomiting)   . Restless leg syndrome   . Stomach ache   . Ulcerative colitis (  [place])   . Varicose veins     Family History  Problem Relation Age of Onset  . Osteoporosis Mother   . Atrial fibrillation Mother   . Heart disease Mother   . Stroke Mother   . Depression Mother   . Anxiety disorder Mother   . Obesity Mother   . Diabetes Father   . Hypertension Father   . Depression Father   . Obesity Father   . Breast cancer Maternal Grandmother   . Cancer Maternal Grandmother        ovarian  . Diabetes Paternal Grandmother   . Migraines Brother        CHRONIC    Social History   Socioeconomic History  . Marital status: Married    Spouse name: Therapist, sports  . Number of children: Not on file  . Years of education: Not on file  . Highest education level: Not on file  Occupational History  . Occupation: Retired Wellsite geologist  Tobacco Use  . Smoking status: Never Smoker  . Smokeless tobacco: Never Used  Substance and Sexual Activity  . Alcohol use: Yes    Alcohol/week: 1.0 standard drinks    Types: 1 Standard drinks or equivalent per week  . Drug use: No  . Sexual activity: Yes    Partners: Male    Birth control/protection: Other-see comments    Comment: husband vasectomy  Other Topics Concern  . Not on file  Social History Narrative  . Not on file    Social Determinants of Health   Financial Resource Strain:   . Difficulty of Paying Living Expenses:   Food Insecurity:   . Worried About Charity fundraiser in the Last Year:   . Arboriculturist in the Last Year:   Transportation Needs:   . Film/video editor (Medical):   [name] Lack of Transportation (Non-Medical):   Physical Activity:   . Days of Exercise per Week:   . Minutes of Exercise per Session:   Stress:   . Feeling of Stress :   Social Connections:   . Frequency of Communication with Friends and Family:   . Frequency of Social Gatherings with Friends and Family:   . Attends Religious Services:   . Active Member of Clubs or Organizations:   . Attends Archivist Meetings:   [name] Marital Status:   Intimate Partner Violence:   . Fear of Current or Ex-Partner:   . Emotionally Abused:   [name] Physically Abused:   . Sexually Abused:     Past Medical History, Surgical history, Social history, and Family history were reviewed and updated as appropriate.   Please see review of systems for further details on the patient's review from today.   Objective:   Physical Exam:  LMP 03/10/2013   Physical Exam Constitutional:      General: She is not in acute distress.    Appearance: She is well-developed.  Musculoskeletal:        General: No deformity.  Neurological:     Mental Status: She is alert and oriented to person, place, and time.     Motor: No atrophy.     Coordination: Coordination normal.     Gait: Gait normal.  Psychiatric:        Attention and Perception: She is attentive.        Mood and Affect: Mood is anxious and depressed. Affect is not labile, blunt, angry or inappropriate.  Speech: Speech normal.        Behavior: Behavior normal.        Thought Content: Thought content normal. Thought content does not include homicidal or suicidal ideation. Thought content does not include homicidal or suicidal plan.        Judgment: Judgment normal.      Comments: Insight intact. No auditory or visual hallucinations. No delusions.  Has been some more depressed anxious and irritable with the health problems.     Lab Review:     Component Value Date/Time   NA 139 10/06/2019 1711   K 5.0 10/06/2019 1711   CL 101 10/06/2019 1711   CO2 26 10/06/2019 1711   GLUCOSE 87 10/06/2019 1711   GLUCOSE 103 (H) 10/18/2016 1830   BUN 28 (H) 10/06/2019 1711   CREATININE 0.73 10/06/2019 1711   CREATININE 0.87 08/04/2013 1100   CALCIUM 9.3 10/06/2019 1711   PROT 6.7 10/06/2019 1711   ALBUMIN 4.2 10/06/2019 1711   AST 24 10/06/2019 1711   ALT 23 10/06/2019 1711   ALKPHOS 67 10/06/2019 1711   BILITOT <0.2 10/06/2019 1711   GFRNONAA 90 10/06/2019 1711   GFRAA 104 10/06/2019 1711       Component Value Date/Time   WBC 9.0 10/06/2019 1711   WBC 8.3 12/10/2013 1426   RBC 4.66 10/06/2019 1711   RBC 4.73 12/10/2013 1426   HGB 14.0 10/06/2019 1711   HGB 13.8 07/30/2013 1333   HCT 41.4 10/06/2019 1711   PLT 265 10/06/2019 1711   MCV 89 10/06/2019 1711   MCH 30.0 10/06/2019 1711   MCH 29.8 12/10/2013 1426   MCHC 33.8 10/06/2019 1711   MCHC 34.1 12/10/2013 1426   RDW 13.4 10/06/2019 1711   LYMPHSABS 2.3 10/06/2019 1711   MONOABS 0.6 12/10/2013 1426   EOSABS 0.3 10/06/2019 1711   BASOSABS 0.1 10/06/2019 1711    No results found for: POCLITH, LITHIUM   No results found for: PHENYTOIN, PHENOBARB, VALPROATE, CBMZ    06/10/19 Normal D 80, B12 >2000, normal TSH, CBC, CMP  .res Assessment: Plan:    Laresha was seen today for follow-up, depression, anxiety and medication problem.  Diagnoses and all orders for this visit:  Major depressive disorder, recurrent episode, moderate (HCC)  Attention deficit hyperactivity disorder (ADHD), predominantly inattentive type  Generalized anxiety disorder  Insomnia due to other mental disorder   Please see After Visit Summary for patient specific instructions.  Greater than 50% of face to face time  with patient was spent on counseling and coordination of care. 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  Most of the improvement has come through therapy and control of the headaches with some benefit from the medication as well particularly the sertraline.  Sertraline is also help with her anxiety.  The benefits of the meds for sleep are clear as we tried to discontinue clonazepam and she had worsening of not only sleep but also her other psychiatric symptoms.  She is acutely worse this weekend which is probably related to fear about losing her headache treatments.  Her headache treatments have made such a dramatic effect on her quality of life she fears losing that.  Was given some reassurance that is likely the new doctor taking over her care will continue what has been working.  The other factor is starting a sugar-free diet and she recognizes she is having some withdrawal from sugar.  The clonazepam is medically necessary.  She is  failed multiple other sleep medications.  She is aware of sleep hygiene issues in detail.  She understands of the risk of long-term cognitive problems related to it.  She accepts those risks.  Newer studies dispute the risk of dementia.   sHe is tolerating meds well.  She does not want any medication changes.  06/10/19 Normal D 80, B12 >2000, normal TSH, CBC, CMP  Reduced sertraline to 37.5 mg in April 2021.  Consider try NAC for mild cognitive complaints. Other option Aricept, .   DT intolerance Ritalin trial off label modafinil 50 mg.  Discussed potential benefits, risks, and side effects of stimulants with patient to include increased heart rate, palpitations, insomnia, increased anxiety, increased irritability, or decreased appetite.  Instructed patient to contact office if experiencing any significant tolerability issues.  FU 2 mos  Lynder Parents MD, DFAPA  Future Appointments  Date Time Provider [place]  10/20/2019  2:00 PM Briscoe Deutscher, DO [facility] None  10/28/2019  4:00 PM Nash Dimmer, PsyD [facility] None  11/10/2019  2:30 PM Lyndal Pulley, DO [facility] None    No orders of the defined types were placed in this encounter.     -------------------------------

## 2019-10-20 ENCOUNTER — Telehealth: Payer: Self-pay | Admitting: Psychiatry

## 2019-10-20 ENCOUNTER — Encounter (INDEPENDENT_AMBULATORY_CARE_PROVIDER_SITE_OTHER): Payer: Self-pay | Admitting: Family Medicine

## 2019-10-20 ENCOUNTER — Other Ambulatory Visit: Payer: Self-pay

## 2019-10-20 ENCOUNTER — Ambulatory Visit (INDEPENDENT_AMBULATORY_CARE_PROVIDER_SITE_OTHER): Payer: BC Managed Care – PPO | Admitting: Family Medicine

## 2019-10-20 VITALS — BP 103/69 | HR 77 | Temp 97.9°F | Ht 65.0 in | Wt 187.0 lb

## 2019-10-20 DIAGNOSIS — F5089 Other specified eating disorder: Secondary | ICD-10-CM | POA: Diagnosis not present

## 2019-10-20 DIAGNOSIS — G8929 Other chronic pain: Secondary | ICD-10-CM | POA: Diagnosis not present

## 2019-10-20 DIAGNOSIS — Z79899 Other long term (current) drug therapy: Secondary | ICD-10-CM | POA: Diagnosis not present

## 2019-10-20 DIAGNOSIS — Z9189 Other specified personal risk factors, not elsewhere classified: Secondary | ICD-10-CM | POA: Diagnosis not present

## 2019-10-20 DIAGNOSIS — G43809 Other migraine, not intractable, without status migrainosus: Secondary | ICD-10-CM

## 2019-10-20 DIAGNOSIS — E66811 Obesity, class 1: Secondary | ICD-10-CM

## 2019-10-20 DIAGNOSIS — E669 Obesity, unspecified: Secondary | ICD-10-CM

## 2019-10-20 DIAGNOSIS — Z6831 Body mass index (BMI) 31.0-31.9, adult: Secondary | ICD-10-CM

## 2019-10-20 NOTE — Telephone Encounter (Signed)
Noted that's correct

## 2019-10-20 NOTE — Telephone Encounter (Signed)
Pt was prescribed modafinil last week and she asked the pharmacy if it comes in 50m and it does not. Pt is splitting the pill. Pt said that it is working well.

## 2019-10-21 NOTE — Progress Notes (Signed)
Chief Complaint:   OBESITY Marisa Hamilton is here to discuss her progress with her obesity treatment plan along with follow-up of her obesity related diagnoses. Marisa Hamilton is on the Category 2 Plan and the [place] and states she is following her eating plan approximately 70% of the time. Marisa Hamilton states she is doing pilates for 50 minutes 3 times per week.  Today's visit was #: 8 Starting weight: 193 lbs Starting date: 06/10/2019 Today's weight: 187 lbs Today's date: 10/21/2019 Total lbs lost to date: 6 lbs Total lbs lost since last in-office visit: 2 lbs  Interim History: Marisa Hamilton has had 2 episodes of vomiting after eating ham. Headaches have been worsening.  Denies constipation but is having hard stools.  Assessment/Plan:   1. Other chronic pain Emile will continue to take baclofen as needed for chronic pain.  2. Polypharmacy Medication list reviewed. Will continue to be mindful of weaning medications whenever possible.  3. Other migraine without status migrainosus, not intractable Worsening. Her previous physician left the office. Recommend Garnette set up an appointment with Dr. Jaynee Eagles.  4. Other Specified Feeding or Eating Disorder, Emotional Eating Behaviors Increase Topamax to 50 mg over the next week.  Okay to increase to 75 mg or 100 mg if tolerating well.  5. At risk for side effect of medication Tianah was given approximately 15 minutes of drug side effect counseling today.  We discussed side effect possibility and risk versus benefits. Vale agreed to the medication and will contact this office if these side effects are intolerable.  Repetitive spaced learning was employed today to elicit superior memory formation and behavioral change.  6. Class 1 obesity with serious comorbidity and body mass index (BMI) of 31.0 to 31.9 in adult, unspecified obesity type Marisa Hamilton is currently in the action stage of change. As such, her goal is to continue with weight loss efforts. She has agreed to  the Category 2 Plan and the [place].   Exercise goals: For substantial health benefits, adults should do at least 150 minutes (2 hours and 30 minutes) a week of moderate-intensity, or 75 minutes (1 hour and 15 minutes) a week of vigorous-intensity aerobic physical activity, or an equivalent combination of moderate- and vigorous-intensity aerobic activity. Aerobic activity should be performed in episodes of at least 10 minutes, and preferably, it should be spread throughout the week.  Behavioral modification strategies: increasing lean protein intake, increasing water intake and increasing high fiber foods.  Marisa Hamilton has agreed to follow-up with our clinic in 2 weeks. She was informed of the importance of frequent follow-up visits to maximize her success with intensive lifestyle modifications for her multiple health conditions.   Objective:   Blood pressure 103/69, pulse 77, temperature 97.9 F (36.6 C), temperature source Oral, height 5' 5"  (1.651 m), weight 187 lb (84.8 kg), last menstrual period 03/10/2013, SpO2 97 %. Body mass index is 31.12 kg/m.  General: Cooperative, alert, well developed, in no acute distress. HEENT: Conjunctivae and lids unremarkable. Cardiovascular: Regular rhythm.  Lungs: Normal work of breathing. Neurologic: No focal deficits.   Lab Results  Component Value Date   CREATININE 0.73 10/06/2019   BUN 28 (H) 10/06/2019   NA 139 10/06/2019   K 5.0 10/06/2019   CL 101 10/06/2019   CO2 26 10/06/2019   Lab Results  Component Value Date   ALT 23 10/06/2019   AST 24 10/06/2019   ALKPHOS 67 10/06/2019   BILITOT <0.2 10/06/2019   Lab Results  Component Value  Date   HGBA1C 5.6 10/06/2019   HGBA1C 5.7 (H) 06/10/2019   HGBA1C 5.6 08/04/2013   Lab Results  Component Value Date   INSULIN 22.9 06/10/2019   Lab Results  Component Value Date   TSH 2.550 06/10/2019   Lab Results  Component Value Date   CHOL 187 06/10/2019   HDL 68 06/10/2019   LDLCALC  89 06/10/2019   TRIG 181 (H) 06/10/2019   CHOLHDL 2.7 08/04/2013   Lab Results  Component Value Date   WBC 9.0 10/06/2019   HGB 14.0 10/06/2019   HCT 41.4 10/06/2019   MCV 89 10/06/2019   PLT 265 10/06/2019   Lab Results  Component Value Date   IRON 68 10/06/2019   TIBC 346 10/06/2019   FERRITIN 33 10/06/2019   Attestation Statements:   Reviewed by clinician on day of visit: allergies, medications, problem list, medical history, surgical history, family history, social history, and previous encounter notes.  I, Water quality scientist, CMA, am acting as transcriptionist for Briscoe Deutscher, DO  I have reviewed the above documentation for accuracy and completeness, and I agree with the above. Briscoe Deutscher, DO

## 2019-10-24 ENCOUNTER — Ambulatory Visit (INDEPENDENT_AMBULATORY_CARE_PROVIDER_SITE_OTHER): Payer: Self-pay | Admitting: *Deleted

## 2019-10-24 ENCOUNTER — Other Ambulatory Visit: Payer: Self-pay

## 2019-10-24 DIAGNOSIS — B351 Tinea unguium: Secondary | ICD-10-CM

## 2019-10-24 NOTE — Progress Notes (Signed)
Patient presents today for the 2nd laser treatment. Diagnosed with mycotic nail infection by Dr. Cannon Kettle.   Toenail most affected hallux nails bilateral (R>L).  All other systems are negative.  Nails were filed thin. Laser therapy was administered to 1st toenails bilateral and patient tolerated the treatment well. All safety precautions were in place.   She is also using a topical compound as well.   Follow up in 4 weeks for laser # 3

## 2019-10-28 ENCOUNTER — Telehealth (INDEPENDENT_AMBULATORY_CARE_PROVIDER_SITE_OTHER): Payer: BC Managed Care – PPO | Admitting: Psychology

## 2019-10-28 ENCOUNTER — Other Ambulatory Visit: Payer: Self-pay

## 2019-10-28 DIAGNOSIS — F5089 Other specified eating disorder: Secondary | ICD-10-CM

## 2019-10-29 NOTE — Progress Notes (Signed)
  Office: [phone]  /  Fax: [phone]    Date: November 12, 2019   Appointment Start Time: 2:27pm Duration: 26 minutes Provider: Glennie Isle, Psy.D. Type of Session: Individual Therapy  Location of Patient: Home Location of Provider: Provider's Home Type of Contact: Telepsychological Visit via MyChart Video Visit  Session Content: Marisa Hamilton is a 59 y.o. female presenting via [place] Visit for a follow-up appointment to address the previously established treatment goal of increasing coping skills. Today's appointment was a telepsychological visit due to COVID-19. [name] provided verbal consent for today's telepsychological appointment and she is aware she is responsible for securing confidentiality on her end of the session. Prior to proceeding with today's appointment, [name]'s physical location at the time of this appointment was obtained as well a phone number she could be reached at in the event of technical difficulties. [name] and this provider participated in today's telepsychological service.   This provider conducted a brief check-in. [name] stated she has been dealing with health concerns, which Dr. [name] is aware of. She also described frustration with evening snacking. Thus, session focused on self-compassion. Session also focused further on mindfulness to assist with coping. [name] was led through a mindfulness exercise (A Taste of Mindfulness) and her experience was processed. [name] provided verbal consent during today's appointment for this provider to send a handout for today's exercise via e-mail. This provider also discussed the utilization of YouTube for mindfulness exercises (e.g., exercises by [name]). [name] was receptive to today's appointment as evidenced by openness to sharing, responsiveness to feedback, and willingness to continue engaging in mindfulness exercises.  Mental Status Examination:  Appearance: well groomed and appropriate hygiene  Behavior:  appropriate to circumstances Mood: euthymic Affect: mood congruent Speech: normal in rate, volume, and tone Eye Contact: appropriate Psychomotor Activity: appropriate Gait: unable to assess Thought Process: linear, logical, and goal directed  Thought Content/Perception: no hallucinations, delusions, bizarre thinking or behavior reported or observed and no evidence of suicidal and homicidal ideation, plan, and intent Orientation: time, person, place, and purpose of appointment Memory/Concentration: memory, attention, language, and fund of knowledge intact  Insight/Judgment: good  Interventions:  Conducted a brief chart review Provided empathic reflections and validation Employed supportive psychotherapy interventions to facilitate reduced distress and to improve coping skills with identified stressors Employed motivational interviewing skills to assess patient's willingness/desire to adhere to recommended medical treatments and assignments Engaged patient in mindfulness exercise(s) Employed acceptance and commitment interventions to emphasize mindfulness and acceptance without struggle  DSM-5 Diagnosis(es): 307.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 [name] has demonstrated progress in her goal as evidenced by increased awareness of hunger patterns and increased awareness of triggers for emotional eating. [name] also continues to demonstrate willingness to engage in learned skill(s).  Plan: The next appointment will be scheduled in 2-3 weeks, which will be via MyChart Video Visit. The next session will focus on working towards the established treatment goal.

## 2019-11-03 ENCOUNTER — Other Ambulatory Visit (INDEPENDENT_AMBULATORY_CARE_PROVIDER_SITE_OTHER): Payer: Self-pay | Admitting: Family Medicine

## 2019-11-03 ENCOUNTER — Telehealth: Payer: Self-pay | Admitting: Psychiatry

## 2019-11-03 DIAGNOSIS — F3289 Other specified depressive episodes: Secondary | ICD-10-CM

## 2019-11-03 NOTE — Telephone Encounter (Signed)
PT needs RF Modafanil 115m. It is working very well for her ( She cuts in half). [facility]

## 2019-11-04 ENCOUNTER — Other Ambulatory Visit: Payer: Self-pay

## 2019-11-04 DIAGNOSIS — F900 Attention-deficit hyperactivity disorder, predominantly inattentive type: Secondary | ICD-10-CM

## 2019-11-04 DIAGNOSIS — F331 Major depressive disorder, recurrent, moderate: Secondary | ICD-10-CM

## 2019-11-04 MED ORDER — MODAFINIL 100 MG PO TABS: 100.0000 mg | ORAL_TABLET | Freq: Every day | ORAL | 0 refills | Status: DC

## 2019-11-04 NOTE — Telephone Encounter (Signed)
Patient is taking 1/2 tablet modafinil 100 mg currently and doing really well. She just wanted a refill on file since Dr. Clovis Pu will be out of town. Informed her we would send one over to be put on file for next refill.

## 2019-11-10 ENCOUNTER — Ambulatory Visit: Payer: BC Managed Care – PPO | Admitting: Family Medicine

## 2019-11-10 ENCOUNTER — Other Ambulatory Visit (INDEPENDENT_AMBULATORY_CARE_PROVIDER_SITE_OTHER): Payer: Self-pay | Admitting: Family Medicine

## 2019-11-10 DIAGNOSIS — F3289 Other specified depressive episodes: Secondary | ICD-10-CM

## 2019-11-11 ENCOUNTER — Ambulatory Visit (INDEPENDENT_AMBULATORY_CARE_PROVIDER_SITE_OTHER): Payer: BC Managed Care – PPO | Admitting: Family Medicine

## 2019-11-12 ENCOUNTER — Telehealth (INDEPENDENT_AMBULATORY_CARE_PROVIDER_SITE_OTHER): Payer: BC Managed Care – PPO | Admitting: Psychology

## 2019-11-12 DIAGNOSIS — F5089 Other specified eating disorder: Secondary | ICD-10-CM | POA: Diagnosis not present

## 2019-11-18 NOTE — Progress Notes (Unsigned)
Office: [phone]  /  Fax: [phone]    Date: December 02, 2019   Appointment Start Time: *** Duration: *** minutes Provider: Glennie Isle, Psy.D. Type of Session: Individual Therapy  Location of Patient: {gbptloc:23249} Location of Provider: {Location of Service:22491} Type of Contact: Telepsychological Visit via MyChart Video Visit  Session Content: Marisa Hamilton is a 59 y.o. female presenting via [place] [facility] for a follow-up appointment to address the previously established treatment goal of increasing coping skills. Today's appointment was a telepsychological visit due to COVID-19. Marisa Hamilton provided verbal consent for today's telepsychological appointment and she is aware she is responsible for securing confidentiality on her end of the session. Prior to proceeding with today's appointment, Marisa Hamilton's physical location at the time of this appointment was obtained as well a phone number she could be reached at in the event of technical difficulties. Marisa Hamilton and this provider participated in today's telepsychological service.   This provider conducted a brief check-in and verbally administered the PHQ-9 and GAD-7. *** Marisa Hamilton was receptive to today's appointment as evidenced by openness to sharing, responsiveness to feedback, and {gbreceptiveness:23401}.  Mental Status Examination:  Appearance: {Appearance:22431} Behavior: {Behavior:22445} Mood: {gbmood:21757} Affect: {Affect:22436} Speech: {Speech:22432} Eye Contact: {Eye Contact:22433} Psychomotor Activity: {Motor Activity:22434} Gait: {gbgait:23404} Thought Process: {thought process:22448}  Thought Content/Perception: {disturbances:22451} Orientation: {Orientation:22437} Memory/Concentration: {gbcognition:22449} Insight/Judgment: {Insight:22446}  Structured Assessments Results: The Patient Health Questionnaire-9 (PHQ-9) is a self-report measure that assesses symptoms and severity of depression over the course of the last two weeks.  Marisa Hamilton obtained a score of *** suggesting {GBPHQ9SEVERITY:21752}. Marisa Hamilton finds the endorsed symptoms to be {gbphq9difficulty:21754}. [0= Not at all; 1= Several days; 2= More than half the days; 3= Nearly every day] Little interest or pleasure in doing things ***  Feeling down, depressed, or hopeless ***  Trouble falling or staying asleep, or sleeping too much ***  Feeling tired or having little energy ***  Poor appetite or overeating ***  Feeling bad about yourself --- or that you are a failure or have let yourself or your family down ***  Trouble concentrating on things, such as reading the newspaper or watching television ***  Moving or speaking so slowly that other people could have noticed? Or the opposite --- being so fidgety or restless that you have been moving around a lot more than usual ***  Thoughts that you would be better off dead or hurting yourself in some way ***  PHQ-9 Score ***    The Generalized Anxiety Disorder-7 (GAD-7) is a brief self-report measure that assesses symptoms of anxiety over the course of the last two weeks. Marisa Hamilton obtained a score of *** suggesting {gbgad7severity:21753}. Marisa Hamilton finds the endorsed symptoms to be {gbphq9difficulty:21754}. [0= Not at all; 1= Several days; 2= Over half the days; 3= Nearly every day] Feeling nervous, anxious, on edge ***  Not being able to stop or control worrying ***  Worrying too much about different things ***  Trouble relaxing ***  Being so restless that it's hard to sit still ***  Becoming easily annoyed or irritable ***  Feeling afraid as if something awful might happen ***  GAD-7 Score ***   Interventions:  {Interventions for Progress Notes:23405}  DSM-5 Diagnosis(es): 307.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 Marisa Hamilton has  demonstrated progress in her goal as evidenced by {gbtxprogress:22839}. Marisa Hamilton also {gbtxprogress2:22951}.  Plan: The next appointment will  be scheduled in {gbweeks:21758}, which will be {gbtxmodality:23402}. The next session will focus on {Plan for Next Appointment:23400}.

## 2019-11-21 ENCOUNTER — Ambulatory Visit: Payer: Self-pay | Admitting: Certified Nurse Midwife

## 2019-11-25 ENCOUNTER — Other Ambulatory Visit (INDEPENDENT_AMBULATORY_CARE_PROVIDER_SITE_OTHER): Payer: Self-pay | Admitting: Family Medicine

## 2019-11-25 DIAGNOSIS — F3289 Other specified depressive episodes: Secondary | ICD-10-CM

## 2019-12-02 ENCOUNTER — Ambulatory Visit (INDEPENDENT_AMBULATORY_CARE_PROVIDER_SITE_OTHER): Payer: BC Managed Care – PPO | Admitting: Psychology

## 2019-12-02 ENCOUNTER — Encounter (INDEPENDENT_AMBULATORY_CARE_PROVIDER_SITE_OTHER): Payer: Self-pay | Admitting: Family Medicine

## 2019-12-02 ENCOUNTER — Telehealth (INDEPENDENT_AMBULATORY_CARE_PROVIDER_SITE_OTHER): Payer: BC Managed Care – PPO | Admitting: Psychology

## 2019-12-02 ENCOUNTER — Encounter (INDEPENDENT_AMBULATORY_CARE_PROVIDER_SITE_OTHER): Payer: Self-pay

## 2019-12-02 ENCOUNTER — Ambulatory Visit (INDEPENDENT_AMBULATORY_CARE_PROVIDER_SITE_OTHER): Payer: BC Managed Care – PPO | Admitting: Family Medicine

## 2019-12-02 ENCOUNTER — Other Ambulatory Visit: Payer: Self-pay

## 2019-12-02 VITALS — BP 128/67 | HR 66 | Temp 98.0°F | Ht 65.0 in | Wt 187.0 lb

## 2019-12-02 DIAGNOSIS — K7689 Other specified diseases of liver: Secondary | ICD-10-CM

## 2019-12-02 DIAGNOSIS — Z79899 Other long term (current) drug therapy: Secondary | ICD-10-CM

## 2019-12-02 DIAGNOSIS — Z6831 Body mass index (BMI) 31.0-31.9, adult: Secondary | ICD-10-CM

## 2019-12-02 DIAGNOSIS — G8929 Other chronic pain: Secondary | ICD-10-CM

## 2019-12-02 DIAGNOSIS — F331 Major depressive disorder, recurrent, moderate: Secondary | ICD-10-CM

## 2019-12-02 DIAGNOSIS — D126 Benign neoplasm of colon, unspecified: Secondary | ICD-10-CM

## 2019-12-02 DIAGNOSIS — K5909 Other constipation: Secondary | ICD-10-CM

## 2019-12-02 DIAGNOSIS — K5730 Diverticulosis of large intestine without perforation or abscess without bleeding: Secondary | ICD-10-CM

## 2019-12-02 DIAGNOSIS — G43709 Chronic migraine without aura, not intractable, without status migrainosus: Secondary | ICD-10-CM

## 2019-12-02 DIAGNOSIS — K51919 Ulcerative colitis, unspecified with unspecified complications: Secondary | ICD-10-CM

## 2019-12-02 DIAGNOSIS — E781 Pure hyperglyceridemia: Secondary | ICD-10-CM

## 2019-12-02 DIAGNOSIS — F5089 Other specified eating disorder: Secondary | ICD-10-CM

## 2019-12-02 DIAGNOSIS — J309 Allergic rhinitis, unspecified: Secondary | ICD-10-CM

## 2019-12-02 DIAGNOSIS — F900 Attention-deficit hyperactivity disorder, predominantly inattentive type: Secondary | ICD-10-CM

## 2019-12-02 DIAGNOSIS — F99 Mental disorder, not otherwise specified: Secondary | ICD-10-CM

## 2019-12-02 DIAGNOSIS — E559 Vitamin D deficiency, unspecified: Secondary | ICD-10-CM

## 2019-12-02 DIAGNOSIS — G4761 Periodic limb movement disorder: Secondary | ICD-10-CM

## 2019-12-02 DIAGNOSIS — I83893 Varicose veins of bilateral lower extremities with other complications: Secondary | ICD-10-CM

## 2019-12-02 DIAGNOSIS — F411 Generalized anxiety disorder: Secondary | ICD-10-CM

## 2019-12-02 DIAGNOSIS — N3010 Interstitial cystitis (chronic) without hematuria: Secondary | ICD-10-CM

## 2019-12-02 DIAGNOSIS — M797 Fibromyalgia: Secondary | ICD-10-CM

## 2019-12-02 DIAGNOSIS — R7303 Prediabetes: Secondary | ICD-10-CM | POA: Diagnosis not present

## 2019-12-02 DIAGNOSIS — K219 Gastro-esophageal reflux disease without esophagitis: Secondary | ICD-10-CM

## 2019-12-02 DIAGNOSIS — IMO0002 Reserved for concepts with insufficient information to code with codable children: Secondary | ICD-10-CM

## 2019-12-02 DIAGNOSIS — F5105 Insomnia due to other mental disorder: Secondary | ICD-10-CM

## 2019-12-02 DIAGNOSIS — I519 Heart disease, unspecified: Secondary | ICD-10-CM

## 2019-12-02 DIAGNOSIS — E669 Obesity, unspecified: Secondary | ICD-10-CM

## 2019-12-02 DIAGNOSIS — I5189 Other ill-defined heart diseases: Secondary | ICD-10-CM

## 2019-12-02 NOTE — Progress Notes (Signed)
Office: [phone]  /  Fax: [phone]    Date: December 02, 2019    Time Seen: 2:04pm Duration: 26 minutes Provider: Glennie Isle, Psy.D. Type of Session: Individual Therapy  Type of Contact: Face-to-face  Informed Consent for In-Person Services During COVID-19:  During today's appointment, information about the decision to initiate in-person services in light of the HUTML-46 public health crisis was discussed. Marisa Hamilton and this provider agreed to meet in person for some or all future appointments. If there is a resurgence of the pandemic or other health concerns arise, telepsychological services may be initiated and any related concerns will be discussed and an attempt to address them will be made. Marisa Hamilton verbally acknowledged understanding that if necessary, this provider may determine there is a need to initiate telepsychological services for everyone's well-being. Marisa Hamilton expressed understanding she may request to initiate telepsychological services, and that request will be respected as long as it is feasible and clinically appropriate. Regarding telepsychological services, Marisa Hamilton acknowledged she is ultimately responsible for understanding her insurance benefits as it relates to reimbursement of telepsychological services. Moreover, the risks for opting for in-person services was discussed. Marisa Hamilton verbally acknowledged understanding that by coming to the office, she is assuming the risk of exposure to the coronavirus or other public risk. To obtain in-person services, Marisa Hamilton verbally agreed to taking certain precautions set forth by [facility] to keep everyone safe from exposure, sickness, and possible death. This information was shared by front desk staff either at the time of scheduling and/or during the check-in process. Marisa Hamilton expressed understanding that should she not adhere to these safeguards, it may result in starting/returning to a telepsychological service arrangement and/or the exploration of  other options for treatment. Marisa Hamilton acknowledged understanding that [facility] will follow the protocol set forth by [facility] should a patient present with a fever or other symptoms or disclose recent exposure, which will include rescheduling the appointment. Furthermore, Marisa Hamilton acknowledged understanding that precautions may change if additional local, state or federal orders or guidelines are published. To avoid handling of paper/writing instruments and increasing likelihood of touching, verbal consent was obtained by Marisa Hamilton during today's appointment prior to proceeding. Marisa Hamilton provided verbal consent to proceed, and acknowledged understanding that by verbally consenting to proceed, she is agreeable to all information noted above.   Session Content: Marisa Hamilton is a 59 y.o. female presenting for a follow-up appointment to address the previously established treatment goal of increasing coping skills. The session was initiated with a brief check-in. Marisa Hamilton shared about ongoing medical concerns, which have impacted appetite and eating habits. She also discussed engaging in mindfulness exercises. Positive reinforcement was provided. Session also focused on self-compassion to assist with thought challenging. She was receptive to focusing on both her emotional and physical needs when experiencing pain. Marisa Hamilton was receptive to today's appointment as evidenced by openness to sharing, responsiveness to feedback, and willingness to continue engaging in learned skills.  Mental Status Examination:  Appearance: well groomed and appropriate hygiene  Behavior: appropriate to circumstances Mood: euthymic Affect: mood congruent Speech: normal in rate, volume, and tone Eye Contact: appropriate Psychomotor Activity: appropriate Gait: normal Thought Process: linear, logical, and goal directed  Thought Content/Perception: no hallucinations, delusions, bizarre thinking or behavior reported or observed and no  evidence of suicidal and homicidal ideation, plan, and intent Orientation: time, person, place, and purpose of appointment Memory/Concentration: memory, attention, language, and fund of knowledge intact  Insight/Judgment: good  Interventions:  Conducted a brief chart review Provided empathic  reflections and validation Provided positive reinforcement Employed supportive psychotherapy interventions to facilitate reduced distress and to improve coping skills with identified stressors Employed insight oriented and cognitive psychotherapy interventions to identify and modify anxiety/mood producing thoughts, beliefs, and negative self-appraisals contributing to distress and emotional eating  DSM-5 Diagnosis: 307.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 Marisa Hamilton has demonstrated progress in her goal as evidenced by increased awareness of hunger patterns, increased awareness of triggers for emotional eating and reduction in emotional eating. Marisa Hamilton also continues to demonstrate willingness to engage in learned skill(s).  Plan: The next appointment will be scheduled in three weeks. The next session will focus on working towards the established treatment goal.

## 2019-12-03 ENCOUNTER — Encounter (INDEPENDENT_AMBULATORY_CARE_PROVIDER_SITE_OTHER): Payer: Self-pay

## 2019-12-03 DIAGNOSIS — Z79899 Other long term (current) drug therapy: Secondary | ICD-10-CM | POA: Insufficient documentation

## 2019-12-03 DIAGNOSIS — I5189 Other ill-defined heart diseases: Secondary | ICD-10-CM | POA: Insufficient documentation

## 2019-12-03 DIAGNOSIS — K5190 Ulcerative colitis, unspecified, without complications: Secondary | ICD-10-CM | POA: Insufficient documentation

## 2019-12-03 DIAGNOSIS — N3010 Interstitial cystitis (chronic) without hematuria: Secondary | ICD-10-CM | POA: Insufficient documentation

## 2019-12-03 DIAGNOSIS — F900 Attention-deficit hyperactivity disorder, predominantly inattentive type: Secondary | ICD-10-CM | POA: Insufficient documentation

## 2019-12-03 DIAGNOSIS — M797 Fibromyalgia: Secondary | ICD-10-CM | POA: Insufficient documentation

## 2019-12-03 DIAGNOSIS — F331 Major depressive disorder, recurrent, moderate: Secondary | ICD-10-CM | POA: Insufficient documentation

## 2019-12-03 DIAGNOSIS — R7303 Prediabetes: Secondary | ICD-10-CM | POA: Insufficient documentation

## 2019-12-03 DIAGNOSIS — F5089 Other specified eating disorder: Secondary | ICD-10-CM | POA: Insufficient documentation

## 2019-12-03 DIAGNOSIS — IMO0002 Reserved for concepts with insufficient information to code with codable children: Secondary | ICD-10-CM | POA: Insufficient documentation

## 2019-12-03 DIAGNOSIS — E781 Pure hyperglyceridemia: Secondary | ICD-10-CM | POA: Insufficient documentation

## 2019-12-03 DIAGNOSIS — F99 Mental disorder, not otherwise specified: Secondary | ICD-10-CM | POA: Insufficient documentation

## 2019-12-03 DIAGNOSIS — K5909 Other constipation: Secondary | ICD-10-CM | POA: Insufficient documentation

## 2019-12-03 DIAGNOSIS — D126 Benign neoplasm of colon, unspecified: Secondary | ICD-10-CM | POA: Insufficient documentation

## 2019-12-03 DIAGNOSIS — E559 Vitamin D deficiency, unspecified: Secondary | ICD-10-CM | POA: Insufficient documentation

## 2019-12-03 DIAGNOSIS — K5730 Diverticulosis of large intestine without perforation or abscess without bleeding: Secondary | ICD-10-CM | POA: Insufficient documentation

## 2019-12-03 DIAGNOSIS — K219 Gastro-esophageal reflux disease without esophagitis: Secondary | ICD-10-CM | POA: Insufficient documentation

## 2019-12-03 DIAGNOSIS — G8929 Other chronic pain: Secondary | ICD-10-CM | POA: Insufficient documentation

## 2019-12-03 DIAGNOSIS — F5105 Insomnia due to other mental disorder: Secondary | ICD-10-CM | POA: Insufficient documentation

## 2019-12-03 NOTE — Progress Notes (Signed)
Chief Complaint:   OBESITY Marisa Hamilton is here to discuss her progress with her obesity treatment plan along with follow-up of her obesity related diagnoses. Marisa Hamilton is on the Category 2 Plan and the [place] and states she is following her eating plan approximately 70% of the time. Marisa Hamilton states she is doing Pilates for 60 minutes, 3 times per week.  Today's visit was #: 9 Today's weight: 187.6 Today's date: 12/03/2019 Total lbs lost to date: 6 Total lbs lost since last in-office visit: 0  Interim History: Marisa Hamilton continues to feel better with her lifestyle changes. She is struggling with the lack of scale movement. Today, we reviewed her pain disorder, GI concerns, and polypharmacy. These issues directly impact her care plan for optimization of BMI and metabolic health as it impacts the patient's ability to make lifestyle changes.  Her chart has been reviewed to clarify all medical diagnoses, especially the ulcerative colitis diagnosis (not in our chart).   Patient Active Problem List   Diagnosis Date Noted  . Other Specified Feeding or Eating Disorder, Emotional Eating Behaviors 12/03/2019  . Other chronic pain 12/03/2019  . Polypharmacy 12/03/2019  . Prediabetes 12/03/2019  . Vitamin D deficiency 12/03/2019  . Moderate episode of recurrent major depressive disorder ([place]) 12/03/2019  . Attention deficit hyperactivity disorder (ADHD), predominantly inattentive type 12/03/2019  . Insomnia due to other mental disorder 12/03/2019  . Hypertriglyceridemia 12/03/2019  . Fibromyalgia 12/03/2019  . Constipation, chronic 12/03/2019  . IC (interstitial cystitis) 12/03/2019  . Grade I diastolic dysfunction 37/08/8887  . Chronic migraine 12/03/2019  . Diverticulosis of colon 12/03/2019  . GERD (gastroesophageal reflux disease) 12/03/2019  . Tubular adenoma of colon, colonoscopy 2017, with repeat due 2022, [name] 12/03/2019  . Ulcerative colitis ([place]) 12/03/2019  . Low back pain  10/07/2019  . Pain in joint, ankle and foot 10/07/2019  . GAD (generalized anxiety disorder) 03/02/2018  . PTSD (post-traumatic stress disorder) 03/02/2018  . Chronic venous insufficiency 10/28/2013  . Varicose veins of bilateral lower extremities with other complications 16/94/5038  . PLMD (periodic limb movement disorder) 12/11/2009    Current Outpatient Medications:  .  Eptinezumab-jjmr (VYEPTI) 100 MG/ML injection, Inject into the vein., Disp: , Rfl:  .  Ubrogepant 100 MG TABS, Take by mouth., Disp: , Rfl:  .  baclofen (LIORESAL) 10 MG tablet, Take 10 mg by mouth. Take one tablet by mouth three times a day as needed, Disp: , Rfl:  .  CALCIUM PO, Take 600 mg by mouth 2 (two) times daily. , Disp: , Rfl:  .  chlorproMAZINE (THORAZINE) 25 MG tablet, Take 1 tablet by mouth as needed., Disp: , Rfl:  .  Cholecalciferol (VITAMIN D PO), Take 7,500 Int'l Units by mouth every 3 (three) days. , Disp: , Rfl:  .  clonazePAM (KLONOPIN) 1 MG tablet, Take 1 tablet (1 mg total) by mouth at bedtime., Disp: 90 tablet, Rfl: 5 .  Cyanocobalamin (B-12 PO), Take 5,000 mcg by mouth., Disp: , Rfl:  .  Docusate Calcium (STOOL SOFTENER PO), Take by mouth at bedtime as needed. , Disp: , Rfl:  .  Famotidine (PEPCID PO), Take by mouth. Once or twice daily, Disp: , Rfl:  .  FIBER ADULT GUMMIES PO, Take 2 each by mouth daily., Disp: , Rfl:  .  ketorolac (TORADOL) 60 MG/2ML SOLN injection, Inject 60 mg into the muscle once., Disp: , Rfl:  .  lamoTRIgine (LAMICTAL) 100 MG tablet, Take 1 tablet (100 mg total) by  mouth daily., Disp: 90 tablet, Rfl: 1 .  MAGNESIUM PO, Take 1,350 mg by mouth. 3 pills at night, Disp: , Rfl:  .  modafinil (PROVIGIL) 100 MG tablet, Take 1 tablet (100 mg total) by mouth daily., Disp: 30 tablet, Rfl: 0 .  Multiple Vitamins-Minerals (CENTRUM SILVER PO), Take 1 tablet by mouth daily., Disp: , Rfl:  .  Naldemedine Tosylate (SYMPROIC) 0.2 MG TABS, Take 1 tablet by mouth daily., Disp: , Rfl:  .   NONFORMULARY OR COMPOUNDED ITEM, [facility]: Antifungal topical - Terbinafine 3%, Fluconazole 2%, Tea Tree Oil 5%, Urea 10%, Ibuprofen 2%, in DMSO Suspension #26m. Apply to the affected toenail(s) once (at bedtime) or twice daily., Disp: 30 each, Rfl: 11 .  omeprazole (PRILOSEC) 40 MG capsule, TAKE 1 CAPSULE BY MOUTH 20 minutes BEFORE breakfast every DAY, Disp: , Rfl:  .  ondansetron (ZOFRAN) 8 MG tablet, Take by mouth., Disp: , Rfl:  .  polyethylene glycol powder (GLYCOLAX/MIRALAX) powder, Take by mouth daily as needed. , Disp: , Rfl:  .  Probiotic Product (PROBIOTIC PO), Take by mouth daily., Disp: , Rfl:  .  REYVOW 100 MG TABS, Take 1 tablet by mouth daily as needed., Disp: , Rfl:  .  Rimegepant Sulfate (NURTEC) 75 MG TBDP, Take 1 tablet by mouth. Take one tablet every 24 hours as needed for migraine onset, Disp: , Rfl:  .  sertraline (ZOLOFT) 25 MG tablet, Take 1 tablet (25 mg total) by mouth 2 (two) times daily. (Patient taking differently: Take 37.5 mg by mouth daily. As of 08/25/19 reduced from 50 mg daily), Disp: 180 tablet, Rfl: 1 .  tiZANidine (ZANAFLEX) 4 MG tablet, Take 1 tablet by mouth as needed., Disp: , Rfl:  .  topiramate (TOPAMAX) 50 MG tablet, Take 1 tablet (50 mg total) by mouth 2 (two) times daily., Disp: 60 tablet, Rfl: 0 .  traZODone (DESYREL) 100 MG tablet, Take 1 tablet (100 mg total) by mouth at bedtime., Disp: 90 tablet, Rfl: 1 .  Vitamin D, Ergocalciferol, (DRISDOL) 1.25 MG (50000 UNIT) CAPS capsule, Take 1 capsule (50,000 Units total) by mouth every 7 (seven) days., Disp: 12 capsule, Rfl: 0 .  VITAMIN K PO, Take 150 mcg by mouth., Disp: , Rfl:   Assessment/Plan:   1. Other Specified Feeding or Eating Disorder, Emotional Eating Behaviors   2. Other chronic pain   3. Polypharmacy   4. Prediabetes   5. Vitamin D deficiency   6. Moderate episode of recurrent major depressive disorder ([place]   7. Attention deficit hyperactivity disorder (ADHD), predominantly  inattentive type   8. GAD (generalized anxiety disorder)   9. Insomnia due to other mental disorder   10. Varicose veins of bilateral lower extremities with other complications   11. Hypertriglyceridemia   12. Fibromyalgia   13. Constipation, chronic   14. IC (interstitial cystitis)   15. Grade I diastolic dysfunction   16. Chronic migraine   17. PLMD (periodic limb movement disorder)   18. Diverticulosis of colon   19. Gastroesophageal reflux disease without esophagitis   20. Tubular adenoma of colon   21. Ulcerative colitis with complication, unspecified location ([place]   22. Allergic rhinitis, unspecified seasonality, unspecified trigger   23. Hepatic cyst, left lobe, stable, [place]   24. Class 1 obesity with serious comorbidity and body mass index (BMI) of 31.0 to 31.9 in adult, unspecified obesity type    I would like for the patient to review the UC diagnosis with GI again  to see if treatment would help several of her medical issues.   Counseling: Intensive lifestyle modifications are the first line treatment for this issue. We discussed several lifestyle modifications today and shewill continue to work on diet, exercise and weight loss efforts. We will continue to monitor. Orders and follow up as documented in patient record.  Marisa Hamilton is currently in the action stage of change. As such, her goal is to continue with weight loss efforts. She has agreed to the Category 2 Plan and the [place].   Exercise goals: For substantial health benefits, adults should do at least 150 minutes (2 hours and 30 minutes) a week of moderate-intensity, or 75 minutes (1 hour and 15 minutes) a week of vigorous-intensity aerobic physical activity, or an equivalent combination of moderate- and vigorous-intensity aerobic activity. Aerobic activity should be performed in episodes of at least 10 minutes, and preferably, it should be spread throughout the week. Adults should also include muscle-strengthening  activities that involve all major muscle groups on 2 or more days a week.  Behavioral modification strategies: decreasing simple carbohydrates and increasing vegetables.  Marisa Hamilton has agreed to follow-up with our clinic in 4 weeks. She was informed of the importance of frequent follow-up visits to maximize her success with intensive lifestyle modifications for her multiple health conditions.   Objective:   Blood pressure 128/67, pulse 66, temperature 98 F (36.7 C), temperature source Oral, height 5' 5"  (1.651 m), weight 187 lb (84.8 kg), last menstrual period 03/10/2013, SpO2 96 %. Body mass index is 31.12 kg/m.  General: Cooperative, alert, well developed, in no acute distress. HEENT: Conjunctivae and lids unremarkable. Cardiovascular: Regular rhythm.  Lungs: Normal work of breathing. Neurologic: No focal deficits.   Lab Results  Component Value Date   CREATININE 0.73 10/06/2019   BUN 28 (H) 10/06/2019   NA 139 10/06/2019   K 5.0 10/06/2019   CL 101 10/06/2019   CO2 26 10/06/2019   Lab Results  Component Value Date   ALT 23 10/06/2019   AST 24 10/06/2019   ALKPHOS 67 10/06/2019   BILITOT <0.2 10/06/2019   Lab Results  Component Value Date   HGBA1C 5.6 10/06/2019   HGBA1C 5.7 (H) 06/10/2019   HGBA1C 5.6 08/04/2013   Lab Results  Component Value Date   INSULIN 22.9 06/10/2019   Lab Results  Component Value Date   TSH 2.550 06/10/2019   Lab Results  Component Value Date   CHOL 187 06/10/2019   HDL 68 06/10/2019   LDLCALC 89 06/10/2019   TRIG 181 (H) 06/10/2019   CHOLHDL 2.7 08/04/2013   Lab Results  Component Value Date   WBC 9.0 10/06/2019   HGB 14.0 10/06/2019   HCT 41.4 10/06/2019   MCV 89 10/06/2019   PLT 265 10/06/2019   Lab Results  Component Value Date   IRON 68 10/06/2019   TIBC 346 10/06/2019   FERRITIN 33 10/06/2019   Attestation Statements:   Reviewed by clinician on day of visit: allergies, medications, problem list, medical history,  surgical history, family history, social history, and previous encounter notes.  Time spent on visit including pre-visit chart review and post-visit care and documentation was 60 minutes. Time was spent on: preparing to see the patient (eg, review of tests), obtaining and/or reviewing separately obtained history, performing a medically necessary appropriate examination and/or evaluation, counseling and educating the patient/family/caregiver and documenting clinical information in the electronic or other health.

## 2019-12-12 ENCOUNTER — Other Ambulatory Visit: Payer: Self-pay

## 2019-12-12 ENCOUNTER — Ambulatory Visit (INDEPENDENT_AMBULATORY_CARE_PROVIDER_SITE_OTHER): Payer: BC Managed Care – PPO | Admitting: *Deleted

## 2019-12-12 DIAGNOSIS — B351 Tinea unguium: Secondary | ICD-10-CM

## 2019-12-12 DIAGNOSIS — K7689 Other specified diseases of liver: Secondary | ICD-10-CM | POA: Insufficient documentation

## 2019-12-12 NOTE — Progress Notes (Signed)
Patient presents today for the 3rd laser treatment. Diagnosed with mycotic nail infection by Dr. Cannon Kettle.   Toenail most affected hallux nails bilateral (R>L). Hallux left has completely cleared. Hallux right has made significant progress.  All other systems are negative.  Nails were filed thin. Laser therapy was administered to 1st toenails bilateral and patient tolerated the treatment well. All safety precautions were in place.   She is also using a topical compound as well.   Follow up in 6 weeks for laser # 4

## 2019-12-16 NOTE — Progress Notes (Signed)
  Office: [phone]  /  Fax: [phone]    Date: December 30, 2019   Time Seen: 2:00pm Duration: 32 minutes Provider: Glennie Isle, Psy.D. Type of Session: Individual Therapy  Type of Contact: Face-to-face  Session Content: Marisa Hamilton is a 59 y.o. female presenting for a follow-up appointment to address the previously established treatment goal of increasing coping skills. The session was initiated with a brief check-in. Marisa Hamilton expressed concern about her memory and noted increased confusion lately. This was further explored and she described taking a long time making decisions as she wants things to be "perfect." She also noted her memory concerns were specifically related to the time of certain appointments. As such, the Montreal Cognitive Assessment (MoCA) was administered. The MoCA assesses different cognitive domains: attention and concentration, executive functions, memory, language, visuoconstructional skills, conceptual thinking, calculations, and orientation. Marisa Hamilton received 28 out of 30 points possible on the MoCA, which is noted in the normal range. The following points were lost: One point was lost on the language task as Marisa Hamilton listed six words starting with a specific letter; one point was lost on the orientation task as Marisa Hamilton noted today's date was the 16th. She appeared anxious during the administration of the MoCA when she felt she was not doing well as evidenced by her making comments such as: "Oh dear;" "I got this;" and "I can do this." She was encouraged to speak with her PCP further about any memory/concentration concerns; she agreed. Marisa Hamilton acknowledged feeling anxious during the MoCA administration; therefore, anxiety was further discussed. This provider recommended longer-term therapeutic services and Marisa Hamilton provided verbal consent for this provider to place a referral for coping skills, ongoing worry about health concerns, and symptoms of anxiety. Remainder of session focused on  learned skills. Marisa Hamilton reported willingness to continue engaging in mindfulness exercises. Marisa Hamilton was receptive to today's appointment as evidenced by openness to sharing, responsiveness to feedback, and willingness to engage in learned skills.  Mental Status Examination:  Appearance: well groomed and appropriate hygiene  Behavior: appropriate to circumstances Mood: anxious Affect: mood congruent Speech: normal in rate, volume, and tone Eye Contact: appropriate Psychomotor Activity: appropriate Gait: normal Thought Process: linear, logical, and goal directed  Thought Content/Perception: no hallucinations, delusions, bizarre thinking or behavior reported or observed and no evidence of suicidal and homicidal ideation, plan, and intent Orientation: time, person, place, and purpose of appointment Memory/Concentration: memory, attention, language, and fund of knowledge intact  Insight/Judgment: fair  Interventions:  Conducted a brief chart review Provided empathic reflections and validation Employed supportive psychotherapy interventions to facilitate reduced distress and to improve coping skills with identified stressors Reviewed learned skills Recommended/discussed option for longer-term therapeutic services Administered MoCA  DSM-5 Diagnosis: 307.59 (F50.8) Other Specified Feeding or Eating Disorder, Emotional Eating Behaviors and 300.00 (F41.9) Unspecified Anxiety Disorder  Treatment Goal & Progress: During the initial appointment with this provider, the following treatment goal was established: increase coping skills. Marisa Hamilton has demonstrated progress in her goal as evidenced by increased awareness of hunger patterns and increased awareness of triggers for emotional eating. Marisa Hamilton also continues to demonstrate willingness to engage in learned skill(s).  Plan: The next appointment will be scheduled in 2-3 weeks via MyChart Video Visit. The next session will focus on reviewing learned skills  and working towards the established treatment goal.

## 2019-12-22 ENCOUNTER — Ambulatory Visit (INDEPENDENT_AMBULATORY_CARE_PROVIDER_SITE_OTHER): Payer: BC Managed Care – PPO | Admitting: Psychology

## 2019-12-23 ENCOUNTER — Ambulatory Visit (INDEPENDENT_AMBULATORY_CARE_PROVIDER_SITE_OTHER): Payer: BC Managed Care – PPO | Admitting: Psychiatry

## 2019-12-23 ENCOUNTER — Other Ambulatory Visit: Payer: Self-pay

## 2019-12-23 ENCOUNTER — Encounter: Payer: Self-pay | Admitting: Psychiatry

## 2019-12-23 DIAGNOSIS — F900 Attention-deficit hyperactivity disorder, predominantly inattentive type: Secondary | ICD-10-CM

## 2019-12-23 DIAGNOSIS — F3342 Major depressive disorder, recurrent, in full remission: Secondary | ICD-10-CM

## 2019-12-23 DIAGNOSIS — F331 Major depressive disorder, recurrent, moderate: Secondary | ICD-10-CM | POA: Diagnosis not present

## 2019-12-23 DIAGNOSIS — F5105 Insomnia due to other mental disorder: Secondary | ICD-10-CM

## 2019-12-23 DIAGNOSIS — F99 Mental disorder, not otherwise specified: Secondary | ICD-10-CM

## 2019-12-23 DIAGNOSIS — F411 Generalized anxiety disorder: Secondary | ICD-10-CM

## 2019-12-23 MED ORDER — LAMOTRIGINE 100 MG PO TABS: 100.0000 mg | ORAL_TABLET | Freq: Every day | ORAL | 1 refills | Status: DC

## 2019-12-23 MED ORDER — SERTRALINE HCL 25 MG PO TABS: 37.5000 mg | ORAL_TABLET | Freq: Every day | ORAL | 1 refills | Status: DC

## 2019-12-23 MED ORDER — TRAZODONE HCL 100 MG PO TABS: 100.0000 mg | ORAL_TABLET | Freq: Every day | ORAL | 1 refills | Status: DC

## 2019-12-23 MED ORDER — VITAMIN D (ERGOCALCIFEROL) 1.25 MG (50000 UNIT) PO CAPS: 50000.0000 [IU] | ORAL_CAPSULE | ORAL | 1 refills | Status: DC

## 2019-12-23 NOTE — Progress Notes (Signed)
Marisa Hamilton [id number] 1961/05/14 59 y.o.  Subjective:   Patient ID:  Marisa Hamilton is a 59 y.o. (DOB 09/16/1960) female.  Chief Complaint:  Chief Complaint  Patient presents with  . Follow-up  . Anxiety  . Depression  . Stress    health    Depression        Associated symptoms include headaches.  Associated symptoms include no decreased concentration and no suicidal ideas.  Marisa Hamilton presents to the office today for follow-up of depression and anxiety  seen in August 2020.  No meds were changed. Remains on sertrline 37.5 mg, lamotrigine, clonazepam 49m HS.     Last seen 08/18/2019 and was on sertraline 50 mg in addition to the other medicines noted. Continued cognitive complaints consistent with an ADD pattern.  Uncertain as to whether she actually had this as a child or perhaps as a complication of chronic migraine or depression.  She is having disability related and would like to try medication for it. Plan:Ok trial low dose Ritalin 2.5-7.552m BID   10/16/2019 appointment with the following noted: Couldn't tolerate the Ritalin bc of bladder problems at the lowest dose. In a lot of physical pain since here and severe food poisoning since here. Also quite a few HA and back pain so more negative days. Frustrated with this. Started infusions for HA.  Off Aimovig and more HA so far.   Plan: DT intolerance Ritalin trial off label modafinil 50 mg.  12/23/2019 appointment with the following noted: Modafinil helped initially but eventually it started bothering her bladder after about a month. HA are not back under control yet.  A lot of GI issues with bloating and nausea.  4 episodes of acute GI distress without reason.  GI px for over 15 years. Very frustrating and upsetting. Tried topiramate for night eating briefly but avoiding.  Patient reports more depression with pain and other health problems which are worse.  Patient denies any recent difficulty with  anxiety except with mother.  Patient denies difficulty with sleep initiation or maintenance.8 hours lately but variable. Benefit trazodone.   Denies appetite disturbance.  Patient reports that energy and motivation have been good.    Patient denies any suicidal ideation.  Failed psychiatric medication trials include Lexapro, lithium, amitriptyline, Emsam, duloxetine, Pristiq,  Wellbutrin, fluoxetine,  venlafaxine, valproic acid, buspirone, Abilify, clonidine, Topamax, Depakote, Deplin, carbamazepine.   Ritalin 5 mg side effects bladder Modafinil 50 Sleep med failures include amitriptyline, mirtazapine, trazodone, doxepin which caused nightmares, Ambien, gabapentin, Sonata, cyclobenzaprine, Xanax.  Son OCD on Zoloft   Review of Systems:  Review of Systems  Musculoskeletal: Positive for back pain.  Neurological: Positive for headaches. Negative for dizziness, tremors and weakness.  Psychiatric/Behavioral: Positive for depression. Negative for agitation, behavioral problems, confusion, decreased concentration, dysphoric mood, hallucinations, self-injury, sleep disturbance and suicidal ideas. The patient is nervous/anxious. The patient is not hyperactive.     Medications: I have reviewed the patient's current medications.  Current Outpatient Medications  Medication Sig Dispense Refill  . baclofen (LIORESAL) 10 MG tablet Take 10 mg by mouth. Take one tablet by mouth three times a day as needed    . CALCIUM PO Take 600 mg by mouth 2 (two) times daily.     . chlorproMAZINE (THORAZINE) 25 MG tablet Take 1 tablet by mouth as needed.    . clonazePAM (KLONOPIN) 1 MG tablet Take 1 tablet (1 mg total) by mouth at bedtime. 90 tablet 5  . Cyanocobalamin (  B-12 PO) Take 5,000 mcg by mouth.    Marisa Hamilton Calcium (STOOL SOFTENER PO) Take by mouth at bedtime as needed.     [name] Eptinezumab-jjmr (VYEPTI) 100 MG/ML injection Inject into the vein.    . Famotidine (PEPCID PO) Take by mouth. Once or twice daily     . FIBER ADULT GUMMIES PO Take 2 each by mouth daily.    [name] ketorolac (TORADOL) 60 MG/2ML SOLN injection Inject 60 mg into the muscle once.    . lamoTRIgine (LAMICTAL) 100 MG tablet Take 1 tablet (100 mg total) by mouth daily. 90 tablet 1  . MAGNESIUM PO Take 1,350 mg by mouth. 3 pills at night    . Multiple Vitamins-Minerals (CENTRUM SILVER PO) Take 1 tablet by mouth daily.    Marisa Hamilton Tosylate (SYMPROIC) 0.2 MG TABS Take 1 tablet by mouth daily.    . NONFORMULARY OR COMPOUNDED ITEM [facility]: Antifungal topical - Terbinafine 3%, Fluconazole 2%, Tea Tree Oil 5%, Urea 10%, Ibuprofen 2%, in DMSO Suspension #62m. Apply to the affected toenail(s) once (at bedtime) or twice daily. 30 each 11  . omeprazole (PRILOSEC) 40 MG capsule TAKE 1 CAPSULE BY MOUTH 20 minutes BEFORE breakfast every DAY    . ondansetron (ZOFRAN) 8 MG tablet Take by mouth.    . polyethylene glycol powder (GLYCOLAX/MIRALAX) powder Take by mouth daily as needed.     . Probiotic Product (PROBIOTIC PO) Take by mouth daily.    . REYVOW 100 MG TABS Take 1 tablet by mouth daily as needed.    . Rimegepant Sulfate (NURTEC) 75 MG TBDP Take 1 tablet by mouth. Take one tablet every 24 hours as needed for migraine onset    . sertraline (ZOLOFT) 25 MG tablet Take 1.5 tablets (37.5 mg total) by mouth daily. As of 08/25/19 reduced from 50 mg daily 135 tablet 1  . tiZANidine (ZANAFLEX) 4 MG tablet Take 1 tablet by mouth as needed.    . traZODone (DESYREL) 100 MG tablet Take 1 tablet (100 mg total) by mouth at bedtime. 90 tablet 1  . Ubrogepant 100 MG TABS Take by mouth.    . Vitamin D, Ergocalciferol, (DRISDOL) 1.25 MG (50000 UNIT) CAPS capsule Take 1 capsule (50,000 Units total) by mouth every 7 (seven) days. 12 capsule 1  . VITAMIN K PO Take 150 mcg by mouth.    . modafinil (PROVIGIL) 100 MG tablet Take 1 tablet (100 mg total) by mouth daily. (Patient not taking: Reported on 12/23/2019) 30 tablet 0  . topiramate (TOPAMAX) 50 MG  tablet Take 1 tablet (50 mg total) by mouth 2 (two) times daily. (Patient not taking: Reported on 12/23/2019) 60 tablet 0   No current facility-administered medications for this visit.    Medication Side Effects: None  Allergies:  Allergies  Allergen Reactions  . Nsaids Other (See Comments)    Other reaction(s): Abdominal Pain Ulcerative colitis  . Gluten Meal Other (See Comments)    Stomach upset  . Milk-Related Compounds Other (See Comments)    Stomach upset  . Phentermine     Pt stated, "Made my IC flare up"    Past Medical History:  Diagnosis Date  . Anxiety   . Back pain   . CFS (chronic fatigue syndrome)   . Concussion 03-17-17  . Constipation   . Cystitis, interstitial    HAD BLADDER SX FOR SAME  . Depression   . Fainting spell   . Fibromyalgia   . Food allergy   .  Foot pain   . GERD (gastroesophageal reflux disease)   . Headache(784.0)   . IBS (irritable bowel syndrome)    TAKES PROBIOTICS  . Insomnia   . Lactose intolerance   . Leg pain   . Migraine   . MVP (mitral valve prolapse)   . Nausea   . Neck pain   . PONV (postoperative nausea and vomiting)   . Restless leg syndrome   . Stomach ache   . Ulcerative colitis ([place])   . Varicose veins     Family History  Problem Relation Age of Onset  . Osteoporosis Mother   . Atrial fibrillation Mother   . Heart disease Mother   . Stroke Mother   . Depression Mother   . Anxiety disorder Mother   . Obesity Mother   . Diabetes Father   . Hypertension Father   . Depression Father   . Obesity Father   . Breast cancer Maternal Grandmother   . Cancer Maternal Grandmother        ovarian  . Diabetes Paternal Grandmother   . Migraines Brother        CHRONIC    Social History   Socioeconomic History  . Marital status: Married    Spouse name: Therapist, sports  . Number of children: Not on file  . Years of education: Not on file  . Highest education level: Not on file  Occupational History  . Occupation:  Retired Wellsite geologist  Tobacco Use  . Smoking status: Never Smoker  . Smokeless tobacco: Never Used  Substance and Sexual Activity  . Alcohol use: Yes    Alcohol/week: 1.0 standard drink    Types: 1 Standard drinks or equivalent per week  . Drug use: No  . Sexual activity: Yes    Partners: Male    Birth control/protection: Other-see comments    Comment: husband vasectomy  Other Topics Concern  . Not on file  Social History Narrative  . Not on file   Social Determinants of Health   Financial Resource Strain:   . Difficulty of Paying Living Expenses:   Food Insecurity:   . Worried About Charity fundraiser in the Last Year:   . Arboriculturist in the Last Year:   Transportation Needs:   . Film/video editor (Medical):   [name] Lack of Transportation (Non-Medical):   Physical Activity:   . Days of Exercise per Week:   . Minutes of Exercise per Session:   Stress:   . Feeling of Stress :   Social Connections:   . Frequency of Communication with Friends and Family:   . Frequency of Social Gatherings with Friends and Family:   . Attends Religious Services:   . Active Member of Clubs or Organizations:   . Attends Archivist Meetings:   [name] Marital Status:   Intimate Partner Violence:   . Fear of Current or Ex-Partner:   . Emotionally Abused:   [name] Physically Abused:   . Sexually Abused:     Past Medical History, Surgical history, Social history, and Family history were reviewed and updated as appropriate.   Please see review of systems for further details on the patient's review from today.   Objective:   Physical Exam:  LMP 03/10/2013   Physical Exam Constitutional:      General: She is not in acute distress.    Appearance: She is well-developed.  Musculoskeletal:        General: No deformity.  Neurological:  Mental Status: She is alert and oriented to person, place, and time.     Motor: No atrophy.     Coordination: Coordination normal.      Gait: Gait normal.  Psychiatric:        Attention and Perception: She is attentive.        Mood and Affect: Mood is anxious and depressed. Affect is not labile, blunt, angry or inappropriate.        Speech: Speech normal.        Behavior: Behavior normal.        Thought Content: Thought content normal. Thought content does not include homicidal or suicidal ideation. Thought content does not include homicidal or suicidal plan.        Judgment: Judgment normal.     Comments: Insight intact. No auditory or visual hallucinations. No delusions.  Has been some more depressed anxious and irritable with the health problems.     Lab Review:     Component Value Date/Time   NA 139 10/06/2019 1711   K 5.0 10/06/2019 1711   CL 101 10/06/2019 1711   CO2 26 10/06/2019 1711   GLUCOSE 87 10/06/2019 1711   GLUCOSE 103 (H) 10/18/2016 1830   BUN 28 (H) 10/06/2019 1711   CREATININE 0.73 10/06/2019 1711   CREATININE 0.87 08/04/2013 1100   CALCIUM 9.3 10/06/2019 1711   PROT 6.7 10/06/2019 1711   ALBUMIN 4.2 10/06/2019 1711   AST 24 10/06/2019 1711   ALT 23 10/06/2019 1711   ALKPHOS 67 10/06/2019 1711   BILITOT <0.2 10/06/2019 1711   GFRNONAA 90 10/06/2019 1711   GFRAA 104 10/06/2019 1711       Component Value Date/Time   WBC 9.0 10/06/2019 1711   WBC 8.3 12/10/2013 1426   RBC 4.66 10/06/2019 1711   RBC 4.73 12/10/2013 1426   HGB 14.0 10/06/2019 1711   HGB 13.8 07/30/2013 1333   HCT 41.4 10/06/2019 1711   PLT 265 10/06/2019 1711   MCV 89 10/06/2019 1711   MCH 30.0 10/06/2019 1711   MCH 29.8 12/10/2013 1426   MCHC 33.8 10/06/2019 1711   MCHC 34.1 12/10/2013 1426   RDW 13.4 10/06/2019 1711   LYMPHSABS 2.3 10/06/2019 1711   MONOABS 0.6 12/10/2013 1426   EOSABS 0.3 10/06/2019 1711   BASOSABS 0.1 10/06/2019 1711    No results found for: POCLITH, LITHIUM   No results found for: PHENYTOIN, PHENOBARB, VALPROATE, CBMZ    06/10/19 Normal D 80, B12 >2000, normal TSH, CBC,  CMP  .res Assessment: Plan:    Marisa Hamilton was seen today for follow-up, anxiety, depression and stress.  Diagnoses and all orders for this visit:  Major depressive disorder, recurrent episode, moderate (HCC)  Attention deficit hyperactivity disorder (ADHD), predominantly inattentive type  Generalized anxiety disorder -     sertraline (ZOLOFT) 25 MG tablet; Take 1.5 tablets (37.5 mg total) by mouth daily. As of 08/25/19 reduced from 50 mg daily  Insomnia due to other mental disorder -     traZODone (DESYREL) 100 MG tablet; Take 1 tablet (100 mg total) by mouth at bedtime.  Major depression, recurrent, full remission (HCC) -     lamoTRIgine (LAMICTAL) 100 MG tablet; Take 1 tablet (100 mg total) by mouth daily. -     sertraline (ZOLOFT) 25 MG tablet; Take 1.5 tablets (37.5 mg total) by mouth daily. As of 08/25/19 reduced from 50 mg daily  Other orders -     Vitamin D, Ergocalciferol, (DRISDOL) 1.25 MG (50000  UNIT) CAPS capsule; Take 1 capsule (50,000 Units total) by mouth every 7 (seven) days.   Please see After Visit Summary for patient specific instructions.  Greater than 50% of face to face time with patient was spent on counseling and coordination of care. 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  Most of the improvement has come through therapy and control of the headaches with some benefit from the medication as well particularly the sertraline.  Sertraline is also help with her anxiety.  The benefits of the meds for sleep are clear as we tried to discontinue clonazepam and she had worsening of not only sleep but also her other psychiatric symptoms.  Disc risk of internalizing emotions dealing with mother can take a toll on body  The clonazepam is medically necessary.  She is failed multiple other sleep medications.  She is aware of sleep hygiene issues in detail.  She understands of the risk of long-term cognitive problems related to  it.  She accepts those risks.  Newer studies dispute the risk of dementia.   sHe is tolerating meds well.  She does not want any medication changes.  06/10/19 Normal D 80, B12 >2000, normal TSH, CBC, CMP  Reduced sertraline to 37.5 mg in April 2021. Option switch to paroxetine to see if GI Sx are better. Option paroxetine 15.  Disc risk of changing and so we will defer.  Rec 2nd opinion re: Chronic GI problems. She agrees.  Consider try NAC for mild cognitive complaints. Other option Aricept, .   DT intolerance Ritalin trial off label modafinil 50 mg can use it prn as tolerated.  Discussed potential benefits, risks, and side effects of stimulants with patient to include increased heart rate, palpitations, insomnia, increased anxiety, increased irritability, or decreased appetite.  Instructed patient to contact office if experiencing any significant tolerability issues.  FU 3-4 mos  Lynder Parents MD, DFAPA  Future Appointments  Date Time Provider [place]  12/24/2019  4:00 PM Briscoe Deutscher, DO [facility] None  12/30/2019  2:00 PM Nash Dimmer, PsyD [facility] None  01/30/2020  2:00 PM [facility] NURSE [facility]    No orders of the defined types were placed in this encounter.     -------------------------------

## 2019-12-24 ENCOUNTER — Ambulatory Visit (INDEPENDENT_AMBULATORY_CARE_PROVIDER_SITE_OTHER): Payer: BC Managed Care – PPO | Admitting: Family Medicine

## 2019-12-28 ENCOUNTER — Other Ambulatory Visit: Payer: Self-pay

## 2019-12-28 MED ORDER — VITAMIN D (ERGOCALCIFEROL) 1.25 MG (50000 UNIT) PO CAPS: 50000.0000 [IU] | ORAL_CAPSULE | ORAL | 1 refills | Status: DC

## 2019-12-29 ENCOUNTER — Ambulatory Visit (INDEPENDENT_AMBULATORY_CARE_PROVIDER_SITE_OTHER): Payer: BC Managed Care – PPO | Admitting: Family Medicine

## 2019-12-29 DIAGNOSIS — Z6831 Body mass index (BMI) 31.0-31.9, adult: Secondary | ICD-10-CM | POA: Insufficient documentation

## 2019-12-29 DIAGNOSIS — E669 Obesity, unspecified: Secondary | ICD-10-CM | POA: Insufficient documentation

## 2019-12-30 ENCOUNTER — Ambulatory Visit (INDEPENDENT_AMBULATORY_CARE_PROVIDER_SITE_OTHER): Payer: BC Managed Care – PPO | Admitting: Psychology

## 2019-12-30 ENCOUNTER — Other Ambulatory Visit: Payer: Self-pay

## 2019-12-30 DIAGNOSIS — F5089 Other specified eating disorder: Secondary | ICD-10-CM | POA: Diagnosis not present

## 2019-12-30 DIAGNOSIS — F419 Anxiety disorder, unspecified: Secondary | ICD-10-CM

## 2020-01-05 ENCOUNTER — Other Ambulatory Visit: Payer: BC Managed Care – PPO

## 2020-01-08 NOTE — Progress Notes (Unsigned)
Office: [phone]  /  Fax: [phone]    Date: January 22, 2020   Appointment Start Time: *** Duration: *** minutes Provider: Glennie Isle, Psy.D. Type of Session: Individual Therapy  Location of Patient: {gbptloc:23249} Location of Provider: Provider's Home Type of Contact: Telepsychological Visit via MyChart Video Visit  Session Content: Marisa Hamilton is a 59 y.o. female presenting for a follow-up appointment to address the previously established treatment goal of increasing coping skills. Today's appointment was a telepsychological visit due to COVID-19. Marisa Hamilton provided verbal consent for today's telepsychological appointment and she is aware she is responsible for securing confidentiality on her end of the session. Prior to proceeding with today's appointment, Marisa Hamilton's physical location at the time of this appointment was obtained as well a phone number she could be reached at in the event of technical difficulties. Marisa Hamilton and this provider participated in today's telepsychological service.   This provider conducted a brief check-in and verbally administered the PHQ-9 and GAD-7. *** Marisa Hamilton was receptive to today's appointment as evidenced by openness to sharing, responsiveness to feedback, and {gbreceptiveness:23401}.  Mental Status Examination:  Appearance: {Appearance:22431} Behavior: {Behavior:22445} Mood: {gbmood:21757} Affect: {Affect:22436} Speech: {Speech:22432} Eye Contact: {Eye Contact:22433} Psychomotor Activity: {Motor Activity:22434} Gait: {gbgait:23404} Thought Process: {thought process:22448}  Thought Content/Perception: {disturbances:22451} Orientation: {Orientation:22437} Memory/Concentration: {gbcognition:22449} Insight/Judgment: {Insight:22446}  Structured Assessments Results: The Patient Health Questionnaire-9 (PHQ-9) is a self-report measure that assesses symptoms and severity of depression over the course of the last two weeks. Marisa Hamilton obtained a score of ***  suggesting {GBPHQ9SEVERITY:21752}. Marisa Hamilton finds the endorsed symptoms to be {gbphq9difficulty:21754}. [0= Not at all; 1= Several days; 2= More than half the days; 3= Nearly every day] Little interest or pleasure in doing things ***  Feeling down, depressed, or hopeless ***  Trouble falling or staying asleep, or sleeping too much ***  Feeling tired or having little energy ***  Poor appetite or overeating ***  Feeling bad about yourself --- or that you are a failure or have let yourself or your family down ***  Trouble concentrating on things, such as reading the newspaper or watching television ***  Moving or speaking so slowly that other people could have noticed? Or the opposite --- being so fidgety or restless that you have been moving around a lot more than usual ***  Thoughts that you would be better off dead or hurting yourself in some way ***  PHQ-9 Score ***    The Generalized Anxiety Disorder-7 (GAD-7) is a brief self-report measure that assesses symptoms of anxiety over the course of the last two weeks. Marisa Hamilton obtained a score of *** suggesting {gbgad7severity:21753}. Marisa Hamilton finds the endorsed symptoms to be {gbphq9difficulty:21754}. [0= Not at all; 1= Several days; 2= Over half the days; 3= Nearly every day] Feeling nervous, anxious, on edge ***  Not being able to stop or control worrying ***  Worrying too much about different things ***  Trouble relaxing ***  Being so restless that it's hard to sit still ***  Becoming easily annoyed or irritable ***  Feeling afraid as if something awful might happen ***  GAD-7 Score ***   Interventions:  {Interventions for Progress Notes:23405}  DSM-5 Diagnosis(es): 307.59 (F50.8) Other Specified Feeding or Eating Disorder, Emotional Eating Behaviors and 300.00 (F41.9) Unspecified Anxiety Disorder  Treatment Goal & Progress: During the initial appointment with this provider, the following treatment goal was established: increase coping skills.  Marisa Hamilton has demonstrated progress in her goal as evidenced by {gbtxprogress:22839}. Marisa Hamilton also {gbtxprogress2:22951}.  Plan: The next appointment  will be scheduled in {gbweeks:21758}, which will be {gbtxmodality:23402}. The next session will focus on {Plan for Next Appointment:23400}.

## 2020-01-09 ENCOUNTER — Other Ambulatory Visit: Payer: BC Managed Care – PPO

## 2020-01-18 ENCOUNTER — Other Ambulatory Visit: Payer: Self-pay

## 2020-01-18 ENCOUNTER — Ambulatory Visit (HOSPITAL_COMMUNITY)
Admission: EM | Admit: 2020-01-18 | Discharge: 2020-01-18 | Disposition: A | Discharge status: Home / Self Care | Payer: BC Managed Care – PPO | Attending: Family Medicine | Admitting: Family Medicine

## 2020-01-18 ENCOUNTER — Encounter (HOSPITAL_COMMUNITY): Payer: Self-pay | Admitting: Emergency Medicine

## 2020-01-18 DIAGNOSIS — M549 Dorsalgia, unspecified: Secondary | ICD-10-CM | POA: Diagnosis not present

## 2020-01-18 DIAGNOSIS — N390 Urinary tract infection, site not specified: Secondary | ICD-10-CM | POA: Diagnosis present

## 2020-01-18 LAB — POCT URINALYSIS DIPSTICK, ED / UC
Bilirubin Urine: NEGATIVE
Glucose, UA: 100 mg/dL — AB
Ketones, ur: NEGATIVE mg/dL
Leukocytes,Ua: NEGATIVE
Nitrite: POSITIVE — AB
Protein, ur: 30 mg/dL — AB
Specific Gravity, Urine: 1.02 (ref 1.005–1.030)
Urobilinogen, UA: 1 mg/dL (ref 0.0–1.0)
pH: 5 (ref 5.0–8.0)

## 2020-01-18 MED ORDER — CEPHALEXIN 500 MG PO CAPS: 500.0000 mg | ORAL_CAPSULE | Freq: Two times a day (BID) | ORAL | 0 refills | Status: DC

## 2020-01-18 MED ORDER — LIDOCAINE HCL (PF) 1 % IJ SOLN
INTRAMUSCULAR | Status: AC
  Filled 2020-01-18: qty 2

## 2020-01-18 MED ORDER — CEFTRIAXONE SODIUM 500 MG IJ SOLR
INTRAMUSCULAR | Status: AC
  Filled 2020-01-18: qty 500

## 2020-01-18 MED ORDER — CEFTRIAXONE SODIUM 500 MG IJ SOLR
500.0000 mg | Freq: Once | INTRAMUSCULAR | Status: AC
  Administered 2020-01-18: 500 mg via INTRAMUSCULAR

## 2020-01-18 MED ORDER — CIPROFLOXACIN HCL 500 MG PO TABS: 500.0000 mg | ORAL_TABLET | Freq: Two times a day (BID) | ORAL | 0 refills | Status: DC

## 2020-01-18 NOTE — ED Provider Notes (Signed)
[place]    CSN: [id number] Arrival date & time: 01/18/20  1442      History   Chief Complaint Chief Complaint  Patient presents with  . Urinary Tract Infection  . Back Pain    HPI Marisa Hamilton is a 59 y.o. female.   Patient is a 59 year old female presents today with approximately 4 days of right lower back pain, right lower abdominal pain, burning with urination, pressure with urination.  Worse today.  Has had chills.  Has been using AZO.  No recorded fevers, nausea or vomiting     Past Medical History:  Diagnosis Date  . Anxiety   . Back pain   . CFS (chronic fatigue syndrome)   . Concussion 03-17-17  . Constipation   . Cystitis, interstitial    HAD BLADDER SX FOR SAME  . Depression   . Fainting spell   . Fibromyalgia   . Food allergy   . Foot pain   . GERD (gastroesophageal reflux disease)   . Headache(784.0)   . IBS (irritable bowel syndrome)    TAKES PROBIOTICS  . Insomnia   . Lactose intolerance   . Leg pain   . Migraine   . MVP (mitral valve prolapse)   . Nausea   . Neck pain   . PONV (postoperative nausea and vomiting)   . Restless leg syndrome   . Stomach ache   . Ulcerative colitis ([place])   . Varicose veins     Patient Active Problem List   Diagnosis Date Noted  . Class 1 obesity with serious comorbidity and body mass index (BMI) of 31.0 to 31.9 in adult 12/29/2019  . Hepatic cyst, left lobe, stable, [place] 2018 12/12/2019  . Emotional eating behaviors, followed by Dr. Mallie Mussel (Bariatric Psychology) 12/03/2019  . Other chronic pain 12/03/2019  . Polypharmacy 12/03/2019  . Prediabetes, highest A1c 5.7 on1/23/21, improved with diet changes 12/03/2019  . Vitamin D deficiency, AT GOAL, taking OTC vitamin D, 10/06/19 = 65.9 12/03/2019  . Moderate episode of recurrent major depressive disorder ([place]) 12/03/2019  . Attention deficit hyperactivity disorder (ADHD), predominantly inattentive type 12/03/2019  . Insomnia due to other  mental disorder, followed by Psych, Rx Trazodone q hs 12/03/2019  . Hypertriglyceridemia 12/03/2019  . Fibromyalgia 12/03/2019  . Chronic constipation, treatment includes: fiber gummies, colace, miralax, Symproic 12/03/2019  . IC (interstitial cystitis) 12/03/2019  . Grade I diastolic dysfunction 03/54/6568  . Chronic migraine 12/03/2019  . Diverticulosis of colon 12/03/2019  . GERD (gastroesophageal reflux disease) 12/03/2019  . Tubular adenoma of colon, colonoscopy 2017, with repeat due 2022, [name] 12/03/2019  . Ulcerative colitis ([place]) 12/03/2019  . Low back pain 10/07/2019  . Pain in joint, ankle and foot 10/07/2019  . GAD (generalized anxiety disorder), Rx Zoloft 25 mg q hs, followed by Psych 03/02/2018  . PTSD (post-traumatic stress disorder), followed by Psych 03/02/2018  . Chronic venous insufficiency 10/28/2013  . Varicose veins of bilateral lower extremities with other complications 12/75/1700  . PLMD (periodic limb movement disorder), NPSG 2011 12/11/2009  . Other extrapyramidal disease and abnormal movement disorder 12/11/2009  . Allergic rhinitis 10/05/2009    Past Surgical History:  Procedure Laterality Date  . ABDOMINOPLASTY  4/14  . BLADDER SURGERY     FOR IC  . BREAST BIOPSY Left 02-26-12   negative  . BREAST REDUCTION SURGERY  10/10/2011   Procedure: MAMMARY REDUCTION  (BREAST);  Surgeon: Charlene Brooke, MD;  Location: [facility];  Service: Clinical cytogeneticist;  Laterality: Bilateral;  . LASIK    . NASAL SEPTUM SURGERY    . REDUCTION MAMMAPLASTY Bilateral   . VASCULAR SURGERY     VARICOSE VEINS    OB History    Gravida  2   Para  2   Term  2   Preterm      AB      Living  2     SAB      TAB      Ectopic      Multiple      Live Births  2            Home Medications    Prior to Admission medications   Medication Sig Start Date End Date Taking? Authorizing Provider  clonazePAM (KLONOPIN) 1 MG tablet Take 1 tablet (1 mg  total) by mouth at bedtime. 08/18/19  Yes Cottle, Billey Co., MD  lamoTRIgine (LAMICTAL) 100 MG tablet Take 1 tablet (100 mg total) by mouth daily. 12/23/19  Yes Cottle, Billey Co., MD  Multiple Vitamins-Minerals (CENTRUM SILVER PO) Take 1 tablet by mouth daily.   Yes [provider]  Probiotic Product (PROBIOTIC PO) Take by mouth daily.   Yes [provider]  sertraline (ZOLOFT) 25 MG tablet Take 1.5 tablets (37.5 mg total) by mouth daily. As of 08/25/19 reduced from 50 mg daily 12/23/19  Yes Cottle, Billey Co., MD  traZODone (DESYREL) 100 MG tablet Take 1 tablet (100 mg total) by mouth at bedtime. 12/23/19  Yes Cottle, Billey Co., MD  baclofen (LIORESAL) 10 MG tablet Take 10 mg by mouth. Take one tablet by mouth three times a day as needed    [provider]  CALCIUM PO Take 600 mg by mouth 2 (two) times daily.     [provider]  chlorproMAZINE (THORAZINE) 25 MG tablet Take 1 tablet by mouth as needed. 04/04/13   [provider]  ciprofloxacin (CIPRO) 500 MG tablet Take 1 tablet (500 mg total) by mouth every 12 (twelve) hours. 01/18/20   Veda Arrellano, Tressia Miners A, NP  Cyanocobalamin (B-12 PO) Take 5,000 mcg by mouth.    [provider]  Docusate Calcium (STOOL SOFTENER PO) Take by mouth at bedtime as needed.     [provider]  Famotidine (PEPCID PO) Take by mouth. Once or twice daily    [provider]  FIBER ADULT GUMMIES PO Take 2 each by mouth daily.    [provider]  ketorolac (TORADOL) 60 MG/2ML SOLN injection Inject 60 mg into the muscle once. 11/06/19   [provider]  MAGNESIUM PO Take 1,350 mg by mouth. 3 pills at night    [provider]  modafinil (PROVIGIL) 100 MG tablet Take 1 tablet (100 mg total) by mouth daily. Patient not taking: Reported on 12/23/2019 11/04/19   Cottle, Billey Co., MD  NONFORMULARY OR COMPOUNDED ITEM [facility]: Antifungal topical - Terbinafine 3%, Fluconazole 2%,  Tea Tree Oil 5%, Urea 10%, Ibuprofen 2%, in DMSO Suspension #61m. Apply to the affected toenail(s) once (at bedtime) or twice daily. 08/26/19   SLandis Martins DPM  omeprazole (PRILOSEC) 40 MG capsule TAKE 1 CAPSULE BY MOUTH 20 minutes BEFORE breakfast every DAY 12/15/16   [provider]  ondansetron (ZOFRAN) 8 MG tablet Take by mouth. 03/23/17   [provider]  polyethylene glycol powder (GLYCOLAX/MIRALAX) powder Take by mouth daily as needed.     [provider]  REYVOW  100 MG TABS Take 1 tablet by mouth daily as needed. 10/02/19   [provider]  tiZANidine (ZANAFLEX) 4 MG tablet Take 1 tablet by mouth as needed. 04/05/13   [provider]  topiramate (TOPAMAX) 50 MG tablet Take 1 tablet (50 mg total) by mouth 2 (two) times daily. Patient not taking: Reported on 12/23/2019 11/03/19   Briscoe Deutscher, DO  Ubrogepant 100 MG TABS Take by mouth. 09/24/19   [provider]  Vitamin D, Ergocalciferol, (DRISDOL) 1.25 MG (50000 UNIT) CAPS capsule Take 1 capsule (50,000 Units total) by mouth every 7 (seven) days. 12/28/19   Cottle, Billey Co., MD  VITAMIN K PO Take 150 mcg by mouth.    [provider]    Family History Family History  Problem Relation Age of Onset  . Osteoporosis Mother   . Atrial fibrillation Mother   . Heart disease Mother   . Stroke Mother   . Depression Mother   . Anxiety disorder Mother   . Obesity Mother   . Diabetes Father   . Hypertension Father   . Depression Father   . Obesity Father   . Breast cancer Maternal Grandmother   . Cancer Maternal Grandmother        ovarian  . Diabetes Paternal Grandmother   . Migraines Brother        CHRONIC    Social History Social History   Tobacco Use  . Smoking status: Never Smoker  . Smokeless tobacco: Never Used  Substance Use Topics  . Alcohol use: Yes    Alcohol/week: 1.0 standard drink    Types: 1 Standard drinks or equivalent per week  . Drug use: No      Allergies   Nsaids, Gluten meal, Milk-related compounds, and Phentermine   Review of Systems Review of Systems   Physical Exam Triage Vital Signs ED Triage Vitals  Enc Vitals Group     BP 01/18/20 1621 125/90     Pulse Rate 01/18/20 1621 73     Resp 01/18/20 1621 20     Temp 01/18/20 1621 98.4 F (36.9 C)     Temp Source 01/18/20 1621 Oral     SpO2 01/18/20 1621 98 %     Weight --      Height --      Head Circumference --      Peak Flow --      Pain Score 01/18/20 1617 8     Pain Loc --      Pain Edu? --      Excl. in [place]? --    No data found.  Updated Vital Signs BP 125/90 (BP Location: Left Arm)   Pulse 73   Temp 98.4 F (36.9 C) (Oral)   Resp 20   LMP 03/10/2013   SpO2 98%   Visual Acuity Right Eye Distance:   Left Eye Distance:   Bilateral Distance:    Right Eye Near:   Left Eye Near:    Bilateral Near:     Physical Exam Vitals and nursing note reviewed.  Constitutional:      General: She is not in acute distress.    Appearance: Normal appearance. She is not ill-appearing, toxic-appearing or diaphoretic.  HENT:     Head: Normocephalic.     Nose: Nose normal.  Eyes:     Conjunctiva/sclera: Conjunctivae normal.  Pulmonary:     Effort: Pulmonary effort is normal.  Musculoskeletal:        General: Normal  range of motion.     Cervical back: Normal range of motion.  Skin:    General: Skin is warm and dry.     Findings: No rash.  Neurological:     Mental Status: She is alert.  Psychiatric:        Mood and Affect: Mood normal.      UC Treatments / Results  Labs (all labs ordered are listed, but only abnormal results are displayed) Labs Reviewed  POCT URINALYSIS DIPSTICK, ED / UC - Abnormal; Notable for the following components:      Result Value   Glucose, UA 100 (*)    Hgb urine dipstick MODERATE (*)    Protein, ur 30 (*)    Nitrite POSITIVE (*)    All other components within normal limits  URINE CULTURE     EKG   Radiology No results found.  Procedures Procedures (including critical care time)  Medications Ordered in UC Medications  cefTRIAXone (ROCEPHIN) injection 500 mg (has no administration in time range)    Initial Impression / Assessment and Plan / UC Course  I have reviewed the triage vital signs and the nursing notes.  Pertinent labs & imaging results that were available during my care of the patient were reviewed by me and considered in my medical decision making (see chart for details).     Lower urinary tract infection Positive nitrates and moderate hemoglobin.  Sending for culture. Patient reporting that Cipro is the only medication that works for her.  Will treat with Cipro. Patient also requesting Rocephin injection here in clinic with concern for kidney involvement.  She is not having any flank pain at this time. Lower back pain but does have some chills.  We will go ahead and treat as requested  AZO as needed, push fluids. Follow up as needed for continued or worsening symptoms  Final Clinical Impressions(s) / UC Diagnoses   Final diagnoses:  Lower urinary tract infectious disease     Discharge Instructions     Treating you for a urinary tract infection.  Take the medication as prescribed.  Push fluids. Follow up as needed for continued or worsening symptoms     ED Prescriptions    Medication Sig Dispense Auth. Provider   cephALEXin (KEFLEX) 500 MG capsule  (Status: Discontinued) Take 1 capsule (500 mg total) by mouth 2 (two) times daily for 5 days. 10 capsule Casmir Auguste A, NP   ciprofloxacin (CIPRO) 500 MG tablet Take 1 tablet (500 mg total) by mouth every 12 (twelve) hours. 10 tablet Loura Halt A, NP     PDMP not reviewed this encounter.   Orvan July, NP 01/18/20 1646

## 2020-01-18 NOTE — ED Triage Notes (Signed)
Symptoms started Thursday morning and took azo. It has been significantly worse today.  Pain in right lower back and right lower abdomen.  No burning with urination , but a great deal of discomfort

## 2020-01-18 NOTE — Discharge Instructions (Addendum)
Treating you for a urinary tract infection.  Take the medication as prescribed.  Push fluids. Follow up as needed for continued or worsening symptoms

## 2020-01-19 LAB — URINE CULTURE

## 2020-01-20 NOTE — Progress Notes (Signed)
Office: [phone]  /  Fax: [phone]    Date: January 29, 2020   Appointment Start Time: 11:31am Duration: 27 minutes Provider: Glennie Isle, Psy.D. Type of Session: Individual Therapy  Location of Patient: Home Location of Provider: Provider's Home Type of Contact: Telepsychological Visit via MyChart Video Visit  Session Content: Marisa Hamilton is a 59 y.o. female presenting for a follow-up appointment to address the previously established treatment goal of increasing coping skills. Today's appointment was a telepsychological visit due to COVID-19. Marisa Hamilton provided verbal consent for today's telepsychological appointment and she is aware she is responsible for securing confidentiality on her end of the session. Prior to proceeding with today's appointment, Marisa Hamilton physical location at the time of this appointment was obtained as well a phone number she could be reached at in the event of technical difficulties. Marisa Hamilton and this provider participated in today's telepsychological service.   This provider conducted a brief check-in. Marisa Hamilton shared she has an initial appointment with [facility] on February 03, 2020. She shared she continues to engage in mindfulness exercises. Additionally, Marisa Hamilton stated she has not been feeling well due to a UTI, adding mindfulness exercises have helped her cope. Positive reinforcement was provided. Psychoeducation regarding thought defusion, including its impact on emotional eating and overall well-being was provided to further assist with coping. Marisa Hamilton was led through a thought defusion exercise, and a handout with various exercises was provided. Marisa Hamilton was encouraged to engage in the thought defusion exercises between now and the next appointment with this provider. Marisa Hamilton agreed. For the thought defusion exercise during today's appointment, Marisa Hamilton utilized the following thought: "I am a failure." Her experience was processed after the exercise. Marisa Hamilton  reported, "It helped me distance myself." She was led through another exercise with the same thought. Marisa Hamilton provided verbal consent during today's appointment for this provider to send a handout with thought defusion exercises via e-mail. Furthermore, termination planning was discussed. Marisa Hamilton was receptive to a follow-up appointment in 3-4 weeks and an additional follow-up/termination appointment in 3-4 weeks after that. Marisa Hamilton was receptive to today's appointment as evidenced by openness to sharing, responsiveness to feedback, and willingness to engage in thought defusion exercises to assist with coping.  Mental Status Examination:  Appearance: well groomed and appropriate hygiene  Behavior: appropriate to circumstances Mood: euthymic Affect: mood congruent Speech: normal in rate, volume, and tone Eye Contact: appropriate Psychomotor Activity: appropriate Gait: unable to assess Thought Process: linear, logical, and goal directed  Thought Content/Perception: no hallucinations, delusions, bizarre thinking or behavior reported or observed and no evidence of suicidal and homicidal ideation, plan, and intent Orientation: time, person, place, and purpose of appointment Memory/Concentration: memory, attention, language, and fund of knowledge intact  Insight/Judgment: good  Interventions:  Conducted a brief chart review Provided empathic reflections and validation Employed supportive psychotherapy interventions to facilitate reduced distress and to improve coping skills with identified stressors Psychoeducation provided regarding thought defusion Engaged patient in thought defusion exercise(s) Employed acceptance and commitment interventions to emphasize mindfulness and acceptance without struggle  Provided positive reinforcement   DSM-5 Diagnosis(es): 307.59 (F50.8) Other Specified Feeding or Eating Disorder, Emotional Eating Behaviors and 300.00 (F41.9) Unspecified Anxiety Disorder  Treatment  Goal & Progress: During the initial appointment with this provider, the following treatment goal was established: increase coping skills. Marisa Hamilton has demonstrated progress in her goal as evidenced by increased awareness of hunger patterns and increased awareness of triggers for emotional eating. Marisa Hamilton also continues to demonstrate willingness to engage in learned skill(s).  Plan: The next appointment will be scheduled in three weeks, which will be via MyChart Video Visit. The next session will focus on working towards the established treatment goal.

## 2020-01-21 ENCOUNTER — Other Ambulatory Visit: Payer: Self-pay

## 2020-01-21 ENCOUNTER — Encounter (INDEPENDENT_AMBULATORY_CARE_PROVIDER_SITE_OTHER): Payer: Self-pay | Admitting: Family Medicine

## 2020-01-21 ENCOUNTER — Ambulatory Visit (INDEPENDENT_AMBULATORY_CARE_PROVIDER_SITE_OTHER): Payer: BC Managed Care – PPO | Admitting: Family Medicine

## 2020-01-21 VITALS — BP 96/55 | HR 69 | Temp 97.9°F | Ht 65.0 in | Wt 192.0 lb

## 2020-01-21 DIAGNOSIS — E669 Obesity, unspecified: Secondary | ICD-10-CM | POA: Diagnosis not present

## 2020-01-21 DIAGNOSIS — Z6831 Body mass index (BMI) 31.0-31.9, adult: Secondary | ICD-10-CM

## 2020-01-21 DIAGNOSIS — F5089 Other specified eating disorder: Secondary | ICD-10-CM

## 2020-01-21 DIAGNOSIS — R7303 Prediabetes: Secondary | ICD-10-CM

## 2020-01-21 DIAGNOSIS — K51919 Ulcerative colitis, unspecified with unspecified complications: Secondary | ICD-10-CM | POA: Diagnosis not present

## 2020-01-21 DIAGNOSIS — F3289 Other specified depressive episodes: Secondary | ICD-10-CM

## 2020-01-22 ENCOUNTER — Telehealth (INDEPENDENT_AMBULATORY_CARE_PROVIDER_SITE_OTHER): Payer: BC Managed Care – PPO | Admitting: Psychology

## 2020-01-25 MED ORDER — TOPIRAMATE 50 MG PO TABS: 50.0000 mg | ORAL_TABLET | Freq: Two times a day (BID) | ORAL | 0 refills | Status: DC

## 2020-01-25 NOTE — Progress Notes (Signed)
Chief Complaint:   OBESITY Marisa Hamilton is here to discuss her progress with her obesity treatment plan along with follow-up of her obesity related diagnoses. Marisa Hamilton is on the [place] and states she is following her eating plan approximately 70% of the time. Marisa Hamilton states she is doing pilates 60 minutes 3 times per week.  Interim History: Marisa Hamilton has a UTI, on Cipro. She is somewhat tearful today, stating that she gets emotional when she is treated for a UTI. She continues to struggle with alternating diarrhea and constipation, reporting up to 20 bowel movements per day.   Assessment/Plan:   1. Ulcerative colitis with complication, unspecified location [facility]) Reviewed last GI note with [name] (scanned into media). Last colonoscopy 10/22/15 revealed scattered sigmoid diverticula, internal hemorrhoids, a single non-bleeding ulcer in the ileocecal valve, a tubular adenoma was removed from the proximal ascending colon and biopsies of the ileocecal valve revealed benign colonic mucosa with ulcer. Because her symptoms seem to be worsening, I recommend that she f/u with GI. She wishes to get a new opinion, so we will refer to [facility] or [facility].   2. Prediabetes At goal. Goal is HgbA1c < 5.7 and insulin level closer to 5. The current medical regimen is effective;  continue present plan and medications.  Lab Results  Component Value Date   HGBA1C 5.6 10/06/2019   HGBA1C 5.7 (H) 06/10/2019   HGBA1C 5.6 08/04/2013   Lab Results  Component Value Date   LDLCALC 89 06/10/2019   CREATININE 0.73 10/06/2019   3. Other Specified Feeding or Eating Disorder, Emotional Eating Behaviors The current medical regimen is effective;  continue present plan and medications. Refill Topiramate today.  4. Class 1 obesity with serious comorbidity and body mass index (BMI) of 31.0 to 31.9 in adult, unspecified obesity type  Marisa Hamilton is currently in the action stage of change. As such, her goal is to continue with  weight loss efforts. She has agreed to the [place].   Exercise goals: For substantial health benefits, adults should do at least 150 minutes (2 hours and 30 minutes) a week of moderate-intensity, or 75 minutes (1 hour and 15 minutes) a week of vigorous-intensity aerobic physical activity, or an equivalent combination of moderate- and vigorous-intensity aerobic activity. Aerobic activity should be performed in episodes of at least 10 minutes, and preferably, it should be spread throughout the week. Adults should also include muscle-strengthening activities that involve all major muscle groups on 2 or more days a week.  Behavioral modification strategies: emotional eating strategies.  Marisa Hamilton has agreed to follow-up with our clinic in 3 weeks. She was informed of the importance of frequent follow-up visits to maximize her success with intensive lifestyle modifications for her multiple health conditions.   Objective:   Blood pressure (!) 96/55, pulse 69, temperature 97.9 F (36.6 C), temperature source Oral, height 5' 5"  (1.651 m), weight 192 lb (87.1 kg), last menstrual period 03/10/2013, SpO2 94 %. Body mass index is 31.95 kg/m.  General: Cooperative, alert, well developed, in no acute distress. HEENT: Conjunctivae and lids unremarkable. Cardiovascular: Regular rhythm.  Lungs: Normal work of breathing. Neurologic: No focal deficits.   Lab Results  Component Value Date   CREATININE 0.73 10/06/2019   BUN 28 (H) 10/06/2019   NA 139 10/06/2019   K 5.0 10/06/2019   CL 101 10/06/2019   CO2 26 10/06/2019   Lab Results  Component Value Date   ALT 23 10/06/2019   AST 24 10/06/2019  ALKPHOS 67 10/06/2019   BILITOT <0.2 10/06/2019   Lab Results  Component Value Date   HGBA1C 5.6 10/06/2019   HGBA1C 5.7 (H) 06/10/2019   HGBA1C 5.6 08/04/2013   Lab Results  Component Value Date   INSULIN 22.9 06/10/2019   Lab Results  Component Value Date   TSH 2.550 06/10/2019   Lab  Results  Component Value Date   CHOL 187 06/10/2019   HDL 68 06/10/2019   LDLCALC 89 06/10/2019   TRIG 181 (H) 06/10/2019   CHOLHDL 2.7 08/04/2013   Lab Results  Component Value Date   WBC 9.0 10/06/2019   HGB 14.0 10/06/2019   HCT 41.4 10/06/2019   MCV 89 10/06/2019   PLT 265 10/06/2019   Lab Results  Component Value Date   IRON 68 10/06/2019   TIBC 346 10/06/2019   FERRITIN 33 10/06/2019   Attestation Statements:   Reviewed by clinician on day of visit: allergies, medications, problem list, medical history, surgical history, family history, social history, and previous encounter notes.  Time spent on visit including pre-visit chart review and post-visit care and charting was 50 minutes.

## 2020-01-26 ENCOUNTER — Encounter (INDEPENDENT_AMBULATORY_CARE_PROVIDER_SITE_OTHER): Payer: Self-pay | Admitting: Family Medicine

## 2020-01-29 ENCOUNTER — Telehealth (INDEPENDENT_AMBULATORY_CARE_PROVIDER_SITE_OTHER): Payer: BC Managed Care – PPO | Admitting: Psychology

## 2020-01-29 DIAGNOSIS — F419 Anxiety disorder, unspecified: Secondary | ICD-10-CM | POA: Diagnosis not present

## 2020-01-29 DIAGNOSIS — F5089 Other specified eating disorder: Secondary | ICD-10-CM

## 2020-01-30 ENCOUNTER — Other Ambulatory Visit: Payer: Self-pay

## 2020-01-30 ENCOUNTER — Ambulatory Visit (INDEPENDENT_AMBULATORY_CARE_PROVIDER_SITE_OTHER): Payer: BC Managed Care – PPO | Admitting: *Deleted

## 2020-01-30 DIAGNOSIS — B351 Tinea unguium: Secondary | ICD-10-CM

## 2020-01-30 NOTE — Progress Notes (Signed)
Patient presents today for the 4th laser treatment. Diagnosed with mycotic nail infection by Dr. Cannon Kettle.   Toenail most affected hallux nails bilateral (R>L). Hallux left has completely cleared. Hallux right has made significant progress. She is very excited about the results she is seeing in her nails  All other systems are negative.  Nails were filed thin. Laser therapy was administered to 1st toenails bilateral and patient tolerated the treatment well. All safety precautions were in place.   She is also using a topical compound as well.   Follow up in 6 weeks for laser # 5.

## 2020-02-02 ENCOUNTER — Encounter: Payer: Self-pay | Admitting: Gastroenterology

## 2020-02-03 ENCOUNTER — Ambulatory Visit: Payer: BC Managed Care – PPO | Admitting: Psychology

## 2020-02-04 ENCOUNTER — Other Ambulatory Visit: Payer: Self-pay

## 2020-02-04 ENCOUNTER — Encounter (INDEPENDENT_AMBULATORY_CARE_PROVIDER_SITE_OTHER): Payer: Self-pay | Admitting: Family Medicine

## 2020-02-04 ENCOUNTER — Ambulatory Visit (INDEPENDENT_AMBULATORY_CARE_PROVIDER_SITE_OTHER): Payer: BC Managed Care – PPO | Admitting: Family Medicine

## 2020-02-04 VITALS — BP 106/70 | HR 64 | Temp 98.2°F | Ht 65.0 in | Wt 192.0 lb

## 2020-02-04 DIAGNOSIS — F3289 Other specified depressive episodes: Secondary | ICD-10-CM

## 2020-02-04 DIAGNOSIS — R7303 Prediabetes: Secondary | ICD-10-CM

## 2020-02-04 DIAGNOSIS — E669 Obesity, unspecified: Secondary | ICD-10-CM | POA: Diagnosis not present

## 2020-02-04 DIAGNOSIS — Z6831 Body mass index (BMI) 31.0-31.9, adult: Secondary | ICD-10-CM

## 2020-02-04 DIAGNOSIS — K51919 Ulcerative colitis, unspecified with unspecified complications: Secondary | ICD-10-CM

## 2020-02-05 ENCOUNTER — Ambulatory Visit (INDEPENDENT_AMBULATORY_CARE_PROVIDER_SITE_OTHER): Payer: BC Managed Care – PPO | Admitting: Psychology

## 2020-02-05 DIAGNOSIS — F321 Major depressive disorder, single episode, moderate: Secondary | ICD-10-CM

## 2020-02-05 NOTE — Progress Notes (Signed)
  Office: [phone]  /  Fax: [phone]    Date: February 19, 2020   Appointment Start Time: 11:30am Duration: 26 minutes Provider: Glennie Isle, Psy.D. Type of Session: Individual Therapy  Location of Patient: Home Location of Provider: Provider's Home Type of Contact: Telepsychological Visit via MyChart Video Visit  Session Content: Marisa Hamilton is a 59 y.o. female presenting for a follow-up appointment to address the previously established treatment goal of increasing coping skills. Today's appointment was a telepsychological visit due to COVID-19. [name] provided verbal consent for today's telepsychological appointment and she is aware she is responsible for securing confidentiality on her end of the session. Prior to proceeding with today's appointment, [name]'s physical location at the time of this appointment was obtained as well a phone number she could be reached at in the event of technical difficulties. [name] and this provider participated in today's telepsychological service.   This provider conducted a brief check-in. [name] initiated therapeutic services with [name] with [name], noting, "It's going very well." She shared about reading a book recommended by [name]. Session focused further on thought defusion to assist with coping. She noted, "The thought defusion exercises are fabulous." Ayan was led through a thought defusion exercise (Leaves on a Stream) and her experience was processed. [name] provided verbal consent during today's appointment for this provider to send the handout for today's exercise via e-mail. Notably, this provider discussed her upcoming maternity leave toward the end of November. [name] acknowledged understanding given the uncertain nature of the circumstances, this provider may be out of the office sooner. It was recommended [name] continue with [name]; she agreed. All questions/concerns were addressed. [name] denied any concerns. Overall, [name]  was receptive to today's appointment as evidenced by openness to sharing, responsiveness to feedback, and willingness to continue engaging in thought defusion exercises.  Mental Status Examination:  Appearance: well groomed and appropriate hygiene  Behavior: appropriate to circumstances Mood: euthymic Affect: mood congruent Speech: normal in rate, volume, and tone Eye Contact: appropriate Psychomotor Activity: appropriate Gait: unable to assess Thought Process: linear, logical, and goal directed  Thought Content/Perception: no hallucinations, delusions, bizarre thinking or behavior reported or observed and no evidence of suicidal and homicidal ideation, plan, and intent Orientation: time, person, place, and purpose of appointment Memory/Concentration: memory, attention, language, and fund of knowledge intact  Insight/Judgment: good  Interventions:  Conducted a brief chart review Provided empathic reflections and validation Reviewed content from the previous session Employed supportive psychotherapy interventions to facilitate reduced distress and to improve coping skills with identified stressors Engaged patient in thought defusion exercise(s) Employed acceptance and commitment interventions to emphasize mindfulness and acceptance without struggle  DSM-5 Diagnosis(es): 307.59 (F50.8) Other Specified Feeding or Eating Disorder, Emotional Eating Behaviors and 300.00 (F41.9) Unspecified Anxiety Disorder  Treatment Goal & Progress: During the initial appointment with this provider, the following treatment goal was established: increase coping skills. [name] has demonstrated progress in her goal as evidenced by increased awareness of hunger patterns and increased awareness of triggers for emotional eating. [name] also continues to demonstrate willingness to engage in learned skill(s).  Plan: The next appointment will be scheduled in one month, which will be via MyChart Video Visit. The next  session will focus on working towards the established treatment goal and termination.

## 2020-02-05 NOTE — Progress Notes (Signed)
Chief Complaint:   OBESITY Marisa Hamilton is here to discuss her progress with her obesity treatment plan along with follow-up of her obesity related diagnoses. [name] is on the [place] and states she is following her eating plan approximately 0% of the time. [name] states she is exercising for 0 minutes 0 times per week.  Today's visit was #: 11 Starting weight: 193 lbs Starting date: 06/10/2019 Today's weight: 190 lbs Today's date: 02/04/2020 Total lbs lost to date: 3 lbs Total lbs lost since last in-office visit: 2 lbs Total weight loss percentage to date: -1.55%  Interim History: Mood is hopeful today, knowing that she has an upcoming appointment with Gastroenterology. Continuing to monitor foods, symptoms. Restarting pilates and stretching for pain relief.   Assessment/Plan:   1. Prediabetes Not optimized. Goal is HgbA1c < 5.7 and insulin level closer to 5. She will continue to focus on protein-rich, low simple carbohydrate foods. We reviewed the importance of hydration, regular exercise for stress reduction, and restorative sleep.   Lab Results  Component Value Date   HGBA1C 5.6 10/06/2019   Lab Results  Component Value Date   INSULIN 22.9 06/10/2019   2. Ulcerative colitis with complication She has an appointment in November with [facility]. Will continue to monitor symptoms as they relate to her weight loss journey. This issue directly impacts care plan for optimization of BMI and metabolic health as it impacts the patient's ability to make lifestyle changes.  3. Other depression, with emotional eating Improving. [name] will continue to work on weight loss, exercise, and decreasing simple carbohydrates to help decrease the risk of diabetes.   4. Class 1 obesity with serious comorbidity and body mass index (BMI) of 31.0 to 31.9 in adult, unspecified obesity type [name] is currently in the action stage of change. As such, her goal is to continue with weight loss efforts.  She has agreed to the [place].   Exercise goals: Pilates/stretching.  Behavioral modification strategies: planning for success.  [name] has agreed to follow-up with our clinic in 2-3 weeks. She was informed of the importance of frequent follow-up visits to maximize her success with intensive lifestyle modifications for her multiple health conditions.   Objective:   Blood pressure 106/70, pulse 64, temperature 98.2 F (36.8 C), temperature source Oral, height 5' 5"  (1.651 m), weight 192 lb (87.1 kg), last menstrual period 03/10/2013, SpO2 97 %. Body mass index is 31.95 kg/m.  General: Cooperative, alert, well developed, in no acute distress. HEENT: Conjunctivae and lids unremarkable. Cardiovascular: Regular rhythm.  Lungs: Normal work of breathing. Neurologic: No focal deficits.   Lab Results  Component Value Date   CREATININE 0.73 10/06/2019   BUN 28 (H) 10/06/2019   NA 139 10/06/2019   K 5.0 10/06/2019   CL 101 10/06/2019   CO2 26 10/06/2019   Lab Results  Component Value Date   ALT 23 10/06/2019   AST 24 10/06/2019   ALKPHOS 67 10/06/2019   BILITOT <0.2 10/06/2019   Lab Results  Component Value Date   HGBA1C 5.6 10/06/2019   HGBA1C 5.7 (H) 06/10/2019   HGBA1C 5.6 08/04/2013   Lab Results  Component Value Date   INSULIN 22.9 06/10/2019   Lab Results  Component Value Date   TSH 2.550 06/10/2019   Lab Results  Component Value Date   CHOL 187 06/10/2019   HDL 68 06/10/2019   LDLCALC 89 06/10/2019   TRIG 181 (H) 06/10/2019   CHOLHDL 2.7 08/04/2013  Lab Results  Component Value Date   WBC 9.0 10/06/2019   HGB 14.0 10/06/2019   HCT 41.4 10/06/2019   MCV 89 10/06/2019   PLT 265 10/06/2019   Lab Results  Component Value Date   IRON 68 10/06/2019   TIBC 346 10/06/2019   FERRITIN 33 10/06/2019   Attestation Statements:   Reviewed by clinician on day of visit: allergies, medications, problem list, medical history, surgical history, family  history, social history, and previous encounter notes.  Time spent on visit including pre-visit chart review and post-visit care and charting was 25 minutes.   I, Water quality scientist, CMA, am acting as transcriptionist for Briscoe Deutscher, DO  I have reviewed the above documentation for accuracy and completeness, and I agree with the above. Briscoe Deutscher, DO

## 2020-02-10 ENCOUNTER — Ambulatory Visit (INDEPENDENT_AMBULATORY_CARE_PROVIDER_SITE_OTHER): Payer: BC Managed Care – PPO | Admitting: Psychology

## 2020-02-10 DIAGNOSIS — F321 Major depressive disorder, single episode, moderate: Secondary | ICD-10-CM | POA: Diagnosis not present

## 2020-02-18 ENCOUNTER — Encounter (INDEPENDENT_AMBULATORY_CARE_PROVIDER_SITE_OTHER): Payer: Self-pay | Admitting: Family Medicine

## 2020-02-18 ENCOUNTER — Other Ambulatory Visit: Payer: Self-pay

## 2020-02-18 ENCOUNTER — Other Ambulatory Visit (INDEPENDENT_AMBULATORY_CARE_PROVIDER_SITE_OTHER): Payer: Self-pay | Admitting: Family Medicine

## 2020-02-18 ENCOUNTER — Ambulatory Visit (INDEPENDENT_AMBULATORY_CARE_PROVIDER_SITE_OTHER): Payer: BC Managed Care – PPO | Admitting: Family Medicine

## 2020-02-18 VITALS — BP 93/60 | HR 65 | Temp 98.2°F | Ht 65.0 in | Wt 192.0 lb

## 2020-02-18 DIAGNOSIS — F3289 Other specified depressive episodes: Secondary | ICD-10-CM

## 2020-02-18 DIAGNOSIS — E669 Obesity, unspecified: Secondary | ICD-10-CM

## 2020-02-18 DIAGNOSIS — Z6832 Body mass index (BMI) 32.0-32.9, adult: Secondary | ICD-10-CM | POA: Diagnosis not present

## 2020-02-18 DIAGNOSIS — E559 Vitamin D deficiency, unspecified: Secondary | ICD-10-CM | POA: Diagnosis not present

## 2020-02-18 DIAGNOSIS — R198 Other specified symptoms and signs involving the digestive system and abdomen: Secondary | ICD-10-CM | POA: Diagnosis not present

## 2020-02-19 ENCOUNTER — Telehealth (INDEPENDENT_AMBULATORY_CARE_PROVIDER_SITE_OTHER): Payer: BC Managed Care – PPO | Admitting: Psychology

## 2020-02-19 DIAGNOSIS — F419 Anxiety disorder, unspecified: Secondary | ICD-10-CM

## 2020-02-19 DIAGNOSIS — F5089 Other specified eating disorder: Secondary | ICD-10-CM | POA: Diagnosis not present

## 2020-02-21 ENCOUNTER — Other Ambulatory Visit: Payer: Self-pay

## 2020-02-21 DIAGNOSIS — F5105 Insomnia due to other mental disorder: Secondary | ICD-10-CM

## 2020-02-21 DIAGNOSIS — F99 Mental disorder, not otherwise specified: Secondary | ICD-10-CM

## 2020-02-22 MED ORDER — CLONAZEPAM 1 MG PO TABS: 1.0000 mg | ORAL_TABLET | Freq: Every day | ORAL | 5 refills | Status: DC

## 2020-02-24 ENCOUNTER — Ambulatory Visit (INDEPENDENT_AMBULATORY_CARE_PROVIDER_SITE_OTHER): Payer: BC Managed Care – PPO | Admitting: Psychology

## 2020-02-24 DIAGNOSIS — F321 Major depressive disorder, single episode, moderate: Secondary | ICD-10-CM

## 2020-02-24 NOTE — Progress Notes (Signed)
Chief Complaint:   OBESITY Marisa Hamilton is here to discuss her progress with her obesity treatment plan along with follow-up of her obesity related diagnoses. Marisa Hamilton is on the [place] and states she is following her eating plan approximately 70% of the time. Marisa Hamilton states she is doing pilates for 60 minutes 3 times per week.  Today's visit was #: 12 Starting weight: 193 lbs Starting date: 06/10/2019 Today's weight: 192 lbs Today's date: 02/18/2020 Total lbs lost to date: 1 lb Total lbs lost since last in-office visit: +2 lbs Total weight loss percentage to date: -0.52%  Interim History: Marisa Hamilton says she has been reading, "When the Body Says No", and has been exploring stress reactions with her therapist.  She is exercising regularly. Pain is stable. She is very hopeful that her upcoming visit with GI will help be helpful in managing her chronic abdominal issues.  Assessment/Plan:   1. Vitamin D deficiency Current vitamin D is 65.9, tested on 10/06/2019. Overtreated. Optimal goal > 50 ng/dL.   Plan:  Decrease vitamin D to 50,000 units every 2 weeks.  2. Alternating constipation and diarrhea Marisa Hamilton will be seeing GI next month. She brought in more records today. Copies made and I will send to [facility].  3. Class 1 obesity with serious comorbidity and body mass index (BMI) of 32.0 to 32.9 in adult, unspecified obesity type The current medical regimen is effective;  continue present plan and medications.  Marisa Hamilton is currently in the action stage of change. As such, her goal is to continue with weight loss efforts. She has agreed to the [place].   Exercise goals: As is.  Behavioral modification strategies: keeping a strict food journal.  Marisa Hamilton has agreed to follow-up with our clinic in 5 weeks, after her visit with GI. She was informed of the importance of frequent follow-up visits to maximize her success with intensive lifestyle modifications for her multiple health  conditions.   Objective:   Blood pressure 93/60, pulse 65, temperature 98.2 F (36.8 C), temperature source Oral, height 5' 5"  (1.651 m), weight 192 lb (87.1 kg), last menstrual period 03/10/2013, SpO2 96 %. Body mass index is 31.95 kg/m.  General: Cooperative, alert, well developed, in no acute distress. HEENT: Conjunctivae and lids unremarkable. Cardiovascular: Regular rhythm.  Lungs: Normal work of breathing. Neurologic: No focal deficits.   Lab Results  Component Value Date   CREATININE 0.73 10/06/2019   BUN 28 (H) 10/06/2019   NA 139 10/06/2019   K 5.0 10/06/2019   CL 101 10/06/2019   CO2 26 10/06/2019   Lab Results  Component Value Date   ALT 23 10/06/2019   AST 24 10/06/2019   ALKPHOS 67 10/06/2019   BILITOT <0.2 10/06/2019   Lab Results  Component Value Date   HGBA1C 5.6 10/06/2019   HGBA1C 5.7 (H) 06/10/2019   HGBA1C 5.6 08/04/2013   Lab Results  Component Value Date   INSULIN 22.9 06/10/2019   Lab Results  Component Value Date   TSH 2.550 06/10/2019   Lab Results  Component Value Date   CHOL 187 06/10/2019   HDL 68 06/10/2019   LDLCALC 89 06/10/2019   TRIG 181 (H) 06/10/2019   CHOLHDL 2.7 08/04/2013   Lab Results  Component Value Date   WBC 9.0 10/06/2019   HGB 14.0 10/06/2019   HCT 41.4 10/06/2019   MCV 89 10/06/2019   PLT 265 10/06/2019   Lab Results  Component Value Date   IRON 68  10/06/2019   TIBC 346 10/06/2019   FERRITIN 33 10/06/2019   Attestation Statements:   Reviewed by clinician on day of visit: allergies, medications, problem list, medical history, surgical history, family history, social history, and previous encounter notes.  Time spent on visit including pre-visit chart review and post-visit care and charting was 30 minutes.   I, Water quality scientist, CMA, am acting as transcriptionist for Briscoe Deutscher, DO  I have reviewed the above documentation for accuracy and completeness, and I agree with the above. Briscoe Deutscher, DO

## 2020-03-04 ENCOUNTER — Ambulatory Visit (INDEPENDENT_AMBULATORY_CARE_PROVIDER_SITE_OTHER): Payer: BC Managed Care – PPO | Admitting: Family Medicine

## 2020-03-04 NOTE — Progress Notes (Signed)
  Office: [phone]  /  Fax: [phone]    Date: March 18, 2020   Appointment Start Time: 11:31am Duration: 22 minutes Provider: Glennie Isle, Psy.D. Type of Session: Individual Therapy  Location of Patient: Home Location of Provider: Provider's Home Type of Contact: Telepsychological Visit via MyChart Video Visit  Session Content: Marisa Hamilton is a 59 y.o. female presenting for a follow-up appointment to address the previously established treatment goal of increasing coping skills. Today's appointment was a telepsychological visit due to COVID-19. Marisa Hamilton provided verbal consent for today's telepsychological appointment and she is aware she is responsible for securing confidentiality on her end of the session. Prior to proceeding with today's appointment, Marisa Hamilton's physical location at the time of this appointment was obtained as well a phone number she could be reached at in the event of technical difficulties. Marisa Hamilton and this provider participated in today's telepsychological service.   This provider conducted a brief check-in. Marisa Hamilton stated, "It's been going very well." She discussed feeling she has "grown mentally and emotionally," adding she has been engaging in learned skills. Positive reinforcement was provided. A plan was developed to help Marisa Hamilton cope with emotional eating in the future using learned skills. She wrote down the following plan: focus on hydration; be prepared with snacks congruent to the meal plan; pause to ask questions when experiencing cravings/urges (e.g., Am I really hungry?, Is there something bothering me?, and Will I feel better if I eat?); and engage in discussed coping strategies after going through the aforementioned questions (e.g., mindfulness exercises, focusing on breathing, thought defusion exercises). Marisa Hamilton was receptive to today's appointment as evidenced by openness to sharing, responsiveness to feedback, and willingness to continue engaging in learned  skills.  Mental Status Examination:  Appearance: well groomed and appropriate hygiene  Behavior: appropriate to circumstances Mood: euthymic Affect: mood congruent Speech: normal in rate, volume, and tone Eye Contact: appropriate Psychomotor Activity: appropriate Gait: unable to assess Thought Process: linear, logical, and goal directed  Thought Content/Perception: no hallucinations, delusions, bizarre thinking or behavior reported or observed and no evidence of suicidal and homicidal ideation, plan, and intent Orientation: time, person, place, and purpose of appointment Memory/Concentration: memory, attention, language, and fund of knowledge intact  Insight/Judgment: good  Interventions:  Conducted a brief chart review Provided empathic reflections and validation Provided positive reinforcement Employed supportive psychotherapy interventions to facilitate reduced distress and to improve coping skills with identified stressors Reviewed learned skills  DSM-5 Diagnosis(es): 307.59 (F50.8) Other Specified Feeding or Eating Disorder, Emotional Eating Behaviors and 300.00 (F41.9) Unspecified Anxiety Disorder  Treatment Goal & Progress: During the initial appointment with this provider, the following treatment goal was established: increase coping skills. Marisa Hamilton demonstrated progress in her goal as evidenced by increased awareness of hunger patterns and increased awareness of triggers for emotional eating. Marisa Hamilton also continues to demonstrate willingness to engage in learned skill(s).  Plan: As previously planned, today was Marisa Hamilton's last appointment with this provider. She acknowledged understanding that she may request a follow-up appointment with this provider (following this provider's maternity leave as previously discussed) in the future as long as she is still established with the clinic. No further follow-up planned by this provider.

## 2020-03-09 ENCOUNTER — Ambulatory Visit (INDEPENDENT_AMBULATORY_CARE_PROVIDER_SITE_OTHER): Payer: BC Managed Care – PPO | Admitting: Psychology

## 2020-03-09 DIAGNOSIS — F321 Major depressive disorder, single episode, moderate: Secondary | ICD-10-CM | POA: Diagnosis not present

## 2020-03-15 ENCOUNTER — Other Ambulatory Visit: Payer: Self-pay

## 2020-03-15 ENCOUNTER — Ambulatory Visit (INDEPENDENT_AMBULATORY_CARE_PROVIDER_SITE_OTHER): Payer: BC Managed Care – PPO | Admitting: *Deleted

## 2020-03-15 DIAGNOSIS — B351 Tinea unguium: Secondary | ICD-10-CM

## 2020-03-15 NOTE — Progress Notes (Signed)
Patient presents today for the 5th laser treatment. Diagnosed with mycotic nail infection by Dr. Cannon Kettle.   Toenail most affected hallux nails bilateral (R>L). Hallux left has completely cleared. The right hallux has only a small area of discoloration at the tip of the nail.  All other systems are negative.  Nails were filed thin. Laser therapy was administered to 1st toenails bilateral and patient tolerated the treatment well. All safety precautions were in place.   She is also using a topical compound as well.   Follow up in 8 weeks for laser # 6.   ~Take final nail pic next visit~

## 2020-03-16 ENCOUNTER — Ambulatory Visit (INDEPENDENT_AMBULATORY_CARE_PROVIDER_SITE_OTHER): Payer: BC Managed Care – PPO | Admitting: Psychology

## 2020-03-16 DIAGNOSIS — F321 Major depressive disorder, single episode, moderate: Secondary | ICD-10-CM | POA: Diagnosis not present

## 2020-03-18 ENCOUNTER — Telehealth (INDEPENDENT_AMBULATORY_CARE_PROVIDER_SITE_OTHER): Payer: BC Managed Care – PPO | Admitting: Psychology

## 2020-03-18 DIAGNOSIS — F419 Anxiety disorder, unspecified: Secondary | ICD-10-CM | POA: Diagnosis not present

## 2020-03-18 DIAGNOSIS — F5089 Other specified eating disorder: Secondary | ICD-10-CM | POA: Diagnosis not present

## 2020-03-23 ENCOUNTER — Ambulatory Visit (INDEPENDENT_AMBULATORY_CARE_PROVIDER_SITE_OTHER): Payer: BC Managed Care – PPO | Admitting: Psychology

## 2020-03-23 DIAGNOSIS — F321 Major depressive disorder, single episode, moderate: Secondary | ICD-10-CM | POA: Diagnosis not present

## 2020-03-26 ENCOUNTER — Encounter: Payer: Self-pay | Admitting: Gastroenterology

## 2020-03-26 ENCOUNTER — Ambulatory Visit: Payer: BC Managed Care – PPO | Admitting: Gastroenterology

## 2020-03-26 VITALS — BP 124/74 | HR 70 | Ht 65.0 in | Wt 196.0 lb

## 2020-03-26 DIAGNOSIS — R197 Diarrhea, unspecified: Secondary | ICD-10-CM

## 2020-03-26 DIAGNOSIS — R109 Unspecified abdominal pain: Secondary | ICD-10-CM

## 2020-03-26 DIAGNOSIS — K529 Noninfective gastroenteritis and colitis, unspecified: Secondary | ICD-10-CM

## 2020-03-26 DIAGNOSIS — R1013 Epigastric pain: Secondary | ICD-10-CM

## 2020-03-26 MED ORDER — DICYCLOMINE HCL 20 MG PO TABS: 20.0000 mg | ORAL_TABLET | Freq: Three times a day (TID) | ORAL | 2 refills | Status: DC

## 2020-03-26 MED ORDER — SUPREP BOWEL PREP KIT 17.5-3.13-1.6 GM/177ML PO SOLN: ORAL | 0 refills | Status: DC

## 2020-03-26 NOTE — Patient Instructions (Addendum)
You have been scheduled for a colonoscopy. Please follow written instructions given to you at your visit today.  Please pick up your prep supplies at the pharmacy within the next 1-3 days. If you use inhalers (even only as needed), please bring them with you on the day of your procedure.   STOP probiotics,magnesium and Vitamin D  We will give you IBS food symptom chart to carry home with you to fill out, bring with you to your next appointment  Take Benefiber three times a day with meals  STOP Fiber Gummy's  We will send Dicyclomine 20 mg to your pharmacy   Lactose-Free Diet, Adult If you have lactose intolerance, you are not able to digest lactose. Lactose is a natural sugar found mainly in dairy milk and dairy products. You may need to avoid all foods and beverages that contain lactose. A lactose-free diet can help you do this. Which foods have lactose? Lactose is found in dairy milk and dairy products, such as:  Yogurt.  Cheese.  Butter.  Margarine.  Sour cream.  Cream.  Whipped toppings and nondairy creamers.  Ice cream and other dairy-based desserts. Lactose is also found in foods or products made with dairy milk or milk ingredients. To find out whether a food contains dairy milk or a milk ingredient, look at the ingredients list. Avoid foods with the statement "May contain milk" and foods that contain:  Milk powder.  Whey.  Curd.  Caseinate.  Lactose.  Lactalbumin.  Lactoglobulin. What are alternatives to dairy milk and foods made with milk products?  Lactose-free milk.  Soy milk with added calcium and vitamin D.  Almond milk, coconut milk, rice milk, or other nondairy milk alternatives with added calcium and vitamin D. Note that these are low in protein.  Soy products, such as soy yogurt, soy cheese, soy ice cream, and soy-based sour cream.  Other nut milk products, such as almond yogurt, almond cheese, cashew yogurt, cashew cheese, cashew ice  cream, coconut yogurt, and coconut ice cream. What are tips for following this plan?  Do not consume foods, beverages, vitamins, minerals, or medicines containing lactose. Read ingredient lists carefully.  Look for the words "lactose-free" on labels.  Use lactase enzyme drops or tablets as directed by your health care provider.  Use lactose-free milk or a milk alternative, such as soy milk or almond milk, for drinking and cooking.  Make sure you get enough calcium and vitamin D in your diet. A lactose-free eating plan can be lacking in these important nutrients.  Take calcium and vitamin D supplements as directed by your health care provider. Talk to your health care provider about supplements if you are not able to get enough calcium and vitamin D from food. What foods can I eat?  Fruits All fresh, canned, frozen, or dried fruits that are not processed with lactose. Vegetables All fresh, frozen, and canned vegetables without cheese, cream, or butter sauces. Grains Any that are not made with dairy milk or dairy products. Meats and other proteins Any meat, fish, poultry, and other protein sources that are not made with dairy milk or dairy products. Soy cheese and yogurt. Fats and oils Any that are not made with dairy milk or dairy products. Beverages Lactose-free milk. Soy, rice, or almond milk with added calcium and vitamin D. Fruit and vegetable juices. Sweets and desserts Any that are not made with dairy milk or dairy products. Seasonings and condiments Any that are not made with dairy milk or  dairy products. Calcium Calcium is found in many foods that contain lactose and is important for bone health. The amount of calcium you need depends on your age:  Adults younger than 50 years: 1,000 mg of calcium a day.  Adults older than 50 years: 1,200 mg of calcium a day. If you are not getting enough calcium, you may get it from other sources, including:  Orange juice with calcium  added. There are 300-350 mg of calcium in 1 cup of orange juice.  Calcium-fortified soy milk. There are 300-400 mg of calcium in 1 cup of calcium-fortified soy milk.  Calcium-fortified rice or almond milk. There are 300 mg of calcium in 1 cup of calcium-fortified rice or almond milk.  Calcium-fortified breakfast cereals. There are 100-1,000 mg of calcium in calcium-fortified breakfast cereals.  Spinach, cooked. There are 145 mg of calcium in  cup of cooked spinach.  Edamame, cooked. There are 130 mg of calcium in  cup of cooked edamame.  Collard greens, cooked. There are 125 mg of calcium in  cup of cooked collard greens.  Kale, frozen or cooked. There are 90 mg of calcium in  cup of cooked or frozen kale.  Almonds. There are 95 mg of calcium in  cup of almonds.  Broccoli, cooked. There are 60 mg of calcium in 1 cup of cooked broccoli. The items listed above may not be a complete list of recommended foods and beverages. Contact a dietitian for more options. What foods are not recommended? Fruits None, unless they are made with dairy milk or dairy products. Vegetables None, unless they are made with dairy milk or dairy products. Grains Any grains that are made with dairy milk or dairy products. Meats and other proteins None, unless they are made with dairy milk or dairy products. Dairy All dairy products, including milk, goat's milk, buttermilk, kefir, acidophilus milk, flavored milk, evaporated milk, condensed milk, dulce de [place], eggnog, yogurt, cheese, and cheese spreads. Fats and oils Any that are made with milk or milk products. Margarines and salad dressings that contain milk or cheese. Cream. Half and half. Cream cheese. Sour cream. Chip dips made with sour cream or yogurt. Beverages Hot chocolate. Cocoa with lactose. Instant iced teas. Powdered fruit drinks. Smoothies made with dairy milk or yogurt. Sweets and desserts Any that are made with milk or milk  products. Seasonings and condiments Chewing gum that has lactose. Spice blends if they contain lactose. Artificial sweeteners that contain lactose. Nondairy creamers. The items listed above may not be a complete list of foods and beverages to avoid. Contact a dietitian for more information. Summary  If you are lactose intolerant, it means that you have a hard time digesting lactose, a natural sugar found in milk and milk products.  Following a lactose-free diet can help you manage this condition.  Calcium is important for bone health and is found in many foods that contain lactose. Talk with your health care provider about other sources of calcium. This information is not intended to replace advice given to you by your health care provider. Make sure you discuss any questions you have with your health care provider.  Document Revised: 05/29/2017 Document Reviewed: 05/29/2017 Elsevier Patient Education  [place].   Follow up in 3 months  I appreciate the  opportunity to care for you  Thank You   Harl Bowie , MD

## 2020-03-26 NOTE — Progress Notes (Signed)
Marisa Hamilton    [id number]    April 10, 1961  Primary Care Physician:[name], [name], MD  Referring Physician: [name], MD [address] [place],  [place] [address]   Chief complaint: Constipation, diarrhea, fatigue, abdominal pain, bloating  HPI:  59 year old very pleasant female here for new patient visit with complaints of generalized abdominal pain, fatigue, myalgia, abdominal cramping, bloating and constipation with alternating diarrhea.  She was previously followed by [name]  She can have multiple bowel movements on average 6-10 bowel movements per day with constipation every other day but she feels diarrhea is getting progressively worse.  She cannot leave her home.  Denies any rectal bleeding or melena. . Reviewed labs done by PMD on 02/05/20: VIT D level elevated at 108, rest of CBC, CMP and TSH within normal range  She takes multiple vitamins, vitamin D supplement, magnesium, fiber Gummies and probiotics.  She also tries to drink milk daily.  Denies any NSAID use.  She was very tearful and frustrated with her ongoing symptoms  Colonoscopy October 22, 2015 by [name]: 2 sessile polyps (6-69m) [tubular adenomas] removed from ascending colon with hot snare.  Sigmoid diverticulosis.  1 cm nonbleeding ulcer on IC valve, normal terminal ileum. IC valve ulcer biopsy showed benign colonic mucosa with ulceration.  No features of chronic colitis or dysplasia.  Possible drug-induced injury.  Outpatient Encounter Medications as of 03/26/2020  Medication Sig  . baclofen (LIORESAL) 10 MG tablet Take 10 mg by mouth as needed.   .[name] KitchenCALCIUM PO Take 600 mg by mouth 2 (two) times daily.   . chlorproMAZINE (THORAZINE) 25 MG tablet Take 1 tablet by mouth as needed.  . clonazePAM (KLONOPIN) 1 MG tablet Take 1 tablet (1 mg total) by mouth at bedtime.  . Cyanocobalamin (B-12 PO) Take 5,000 mcg by mouth 4 (four) times a week.   .Mariane BaumgartenCalcium (STOOL SOFTENER  PO) Take by mouth at bedtime as needed.   . Famotidine (PEPCID PO) Take 20 mg by mouth as needed.   .[name] KitchenFIBER ADULT GUMMIES PO Take 2 each by mouth daily.  .[name] Kitchenketorolac (TORADOL) 60 MG/2ML SOLN injection Inject 60 mg into the muscle as needed (migraines).   .[name] KitchenlamoTRIgine (LAMICTAL) 100 MG tablet Take 1 tablet (100 mg total) by mouth daily.  .Liz BeachSuccinate (REYVOW) 100 MG TABS Take 100 mg by mouth as needed.  . loratadine (CLARITIN) 10 MG tablet Take 10 mg by mouth daily.  .[name] KitchenMAGNESIUM PO Take 1,350 mg by mouth. 3 pills at night  . Misc Natural Products (GLUCOS-CHONDROIT-MSM COMPLEX PO) Take 2 tablets by mouth daily. With Turmeric  . Multiple Vitamins-Minerals (CENTRUM SILVER PO) Take 1 tablet by mouth daily.  . NONFORMULARY OR COMPOUNDED ITEM [facility]: Antifungal topical - Terbinafine 3%, Fluconazole 2%, Tea Tree Oil 5%, Urea 10%, Ibuprofen 2%, in DMSO Suspension #38m Apply to the affected toenail(s) once (at bedtime) or twice daily.  . [name] Kitchenmega-3 fish oil (MAXEPA) 1000 MG CAPS capsule Take 1 capsule by mouth daily.  . [name] Kitchenmeprazole (PRILOSEC) 40 MG capsule Take 40 mg by mouth daily.   . ondansetron (ZOFRAN) 8 MG tablet Take 8 mg by mouth as needed.   . polyethylene glycol powder (GLYCOLAX/MIRALAX) powder Take by mouth daily as needed.   . Probiotic Product (PROBIOTIC PO) Take by mouth daily.  . sertraline (ZOLOFT) 25 MG tablet Take 1.5 tablets (37.5 mg total) by mouth daily. As  of 08/25/19 reduced from 50 mg daily (Patient taking differently: Take 25 mg by mouth daily. Take 1.5 tablets qd)  . Simethicone (PHAZYME) 180 MG CAPS Take 180 mg by mouth as needed.  [name] tiZANidine (ZANAFLEX) 4 MG tablet Take 1 tablet by mouth as needed.  . topiramate (TOPAMAX) 50 MG tablet Take 1 tablet (50 mg total) by mouth 2 (two) times daily.  . traZODone (DESYREL) 100 MG tablet Take 1 tablet (100 mg total) by mouth at bedtime.  . Vitamin D, Ergocalciferol, (DRISDOL) 1.25 MG (50000 UNIT) CAPS capsule Take 1  capsule (50,000 Units total) by mouth every 7 (seven) days. (Patient taking differently: Take 50,000 Units by mouth every 14 (fourteen) days. )  . [DISCONTINUED] Ubrogepant 100 MG TABS Take by mouth.  . [DISCONTINUED] VITAMIN K PO Take 150 mcg by mouth.   No facility-administered encounter medications on file as of 03/26/2020.    Allergies as of 03/26/2020 - Review Complete 03/26/2020  Allergen Reaction Noted  . Nsaids Other (See Comments) 12/10/2013  . Gluten meal Other (See Comments) 06/10/2019  . Milk-related compounds Other (See Comments) 06/10/2019  . Phentermine  03/11/2019    Past Medical History:  Diagnosis Date  . Anxiety   . Back pain   . CFS (chronic fatigue syndrome)   . Concussion 03-17-17  . Constipation   . Cystitis, interstitial    HAD BLADDER SX FOR SAME  . Depression   . Fainting spell   . Fibromyalgia   . Food allergy   . Foot pain   . GERD (gastroesophageal reflux disease)   . Headache(784.0)   . IBS (irritable bowel syndrome)    TAKES PROBIOTICS  . Insomnia   . Lactose intolerance   . Leg pain   . Migraine   . MVP (mitral valve prolapse)   . Nausea   . Neck pain   . PONV (postoperative nausea and vomiting)   . Restless leg syndrome   . Stomach ache   . Ulcerative colitis ([place])   . Varicose veins     Past Surgical History:  Procedure Laterality Date  . ABDOMINOPLASTY  4/14  . BLADDER SURGERY     FOR IC  . BREAST BIOPSY Left 02-26-12   negative  . BREAST REDUCTION SURGERY  10/10/2011   Procedure: MAMMARY REDUCTION  (BREAST);  Surgeon: Charlene Brooke, MD;  Location: [facility];  Service: Plastics;  Laterality: Bilateral;  . LASIK    . NASAL SEPTUM SURGERY    . REDUCTION MAMMAPLASTY Bilateral   . VASCULAR SURGERY     VARICOSE VEINS    Family History  Problem Relation Age of Onset  . Osteoporosis Mother   . Atrial fibrillation Mother   . Heart disease Mother   . Stroke Mother   . Depression Mother   . Anxiety  disorder Mother   . Obesity Mother   . Diabetes Father   . Hypertension Father   . Depression Father   . Obesity Father   . Breast cancer Maternal Grandmother   . Cancer Maternal Grandmother        ovarian  . Diabetes Paternal Grandmother   . Migraines Brother        CHRONIC  . Colon cancer Neg Hx   . Esophageal cancer Neg Hx   . Pancreatic cancer Neg Hx   . Stomach cancer Neg Hx   . Liver disease Neg Hx     Social History   Socioeconomic History  . Marital  status: Married    Spouse name: Therapist, sports  . Number of children: Not on file  . Years of education: Not on file  . Highest education level: Not on file  Occupational History  . Occupation: Retired Wellsite geologist  Tobacco Use  . Smoking status: Never Smoker  . Smokeless tobacco: Never Used  Vaping Use  . Vaping Use: Never used  Substance and Sexual Activity  . Alcohol use: Not Currently  . Drug use: No  . Sexual activity: Yes    Partners: Male    Birth control/protection: Other-see comments    Comment: husband vasectomy  Other Topics Concern  . Not on file  Social History Narrative  . Not on file   Social Determinants of Health   Financial Resource Strain:   . Difficulty of Paying Living Expenses: Not on file  Food Insecurity:   . Worried About Charity fundraiser in the Last Year: Not on file  . Ran Out of Food in the Last Year: Not on file  Transportation Needs:   . Lack of Transportation (Medical): Not on file  . Lack of Transportation (Non-Medical): Not on file  Physical Activity:   . Days of Exercise per Week: Not on file  . Minutes of Exercise per Session: Not on file  Stress:   . Feeling of Stress : Not on file  Social Connections:   . Frequency of Communication with Friends and Family: Not on file  . Frequency of Social Gatherings with Friends and Family: Not on file  . Attends Religious Services: Not on file  . Active Member of Clubs or Organizations: Not on file  . Attends Theatre manager Meetings: Not on file  . Marital Status: Not on file  Intimate Partner Violence:   . Fear of Current or Ex-Partner: Not on file  . Emotionally Abused: Not on file  . Physically Abused: Not on file  . Sexually Abused: Not on file      Review of systems: All other review of systems negative except as mentioned in the HPI.   Physical Exam: Vitals:   03/26/20 1335  BP: 124/74  Pulse: 70  SpO2: 97%   Body mass index is 32.62 kg/m. Gen:      No acute distress HEENT:  sclera anicteric Abd:      soft, non-tender; no palpable masses, no distension Ext:    Mild edema Neuro: alert and oriented x 3 Psych: normal mood and affect  Data Reviewed:  Reviewed labs, radiology imaging, old records and pertinent past GI work up   Assessment and Plan/Recommendations:  59 year old very pleasant female with abdominal bloating, cramping, alternating constipation and diarrhea, epigastric and generalized abdominal pain  Vitamin D level significantly elevated >100 ng/ml, some of her symptoms are concerning for chronic vitamin D toxicity.  She has osteopenia, myalgia and chronic fatigue Advised patient to avoid taking any vitamin D supplements.  She has follow-up visit with her PMD, will need to discuss further with her.  Epigastric abdominal pain: Schedule for EGD to exclude peptic ulcer disease or erosive gastritis  GERD: Continue omeprazole and antireflux measures  Chronic diarrhea: History of isolated IC valve ulcer on last colonoscopy in 2017, biopsies were negative for chronic colitis.  Will plan for repeat colonoscopy with biopsies to exclude microscopic or ulcerative colitis  Advised patient to stop magnesium supplements, probiotic and fiber Gummies.  Avoid any artificial sweeteners  Start Benefiber 1 tablespoon 3 times daily with meals  Maintain  IBS food symptom diary  Trial of lactose-free diet for 1 week to evaluate for possible lactose intolerance  Dicyclomine 20  mg up to 3 times daily as needed for abdominal cramping  Return in 2 to 3 months for follow-up visit  This visit required >90 minutes of patient care (this includes precharting, chart review, review of results, face-to-face time used for counseling as well as treatment plan and follow-up. The patient was provided an opportunity to ask questions and all were answered. The patient agreed with the plan and demonstrated an understanding of the instructions.  Damaris Hippo , MD    CC: [name], MD

## 2020-03-30 ENCOUNTER — Ambulatory Visit (INDEPENDENT_AMBULATORY_CARE_PROVIDER_SITE_OTHER): Payer: BC Managed Care – PPO | Admitting: Family Medicine

## 2020-03-30 ENCOUNTER — Other Ambulatory Visit: Payer: Self-pay

## 2020-03-30 ENCOUNTER — Encounter (INDEPENDENT_AMBULATORY_CARE_PROVIDER_SITE_OTHER): Payer: Self-pay | Admitting: Family Medicine

## 2020-03-30 VITALS — BP 94/66 | HR 82 | Temp 97.8°F | Ht 65.0 in | Wt 192.0 lb

## 2020-03-30 DIAGNOSIS — E678 Other specified hyperalimentation: Secondary | ICD-10-CM

## 2020-03-30 DIAGNOSIS — M797 Fibromyalgia: Secondary | ICD-10-CM | POA: Diagnosis not present

## 2020-03-30 DIAGNOSIS — K529 Noninfective gastroenteritis and colitis, unspecified: Secondary | ICD-10-CM

## 2020-03-30 DIAGNOSIS — Z9189 Other specified personal risk factors, not elsewhere classified: Secondary | ICD-10-CM

## 2020-03-30 DIAGNOSIS — F3289 Other specified depressive episodes: Secondary | ICD-10-CM

## 2020-03-30 DIAGNOSIS — E66811 Obesity, class 1: Secondary | ICD-10-CM

## 2020-03-30 DIAGNOSIS — E669 Obesity, unspecified: Secondary | ICD-10-CM

## 2020-03-30 DIAGNOSIS — Z6831 Body mass index (BMI) 31.0-31.9, adult: Secondary | ICD-10-CM

## 2020-03-30 DIAGNOSIS — Z79899 Other long term (current) drug therapy: Secondary | ICD-10-CM

## 2020-04-01 ENCOUNTER — Encounter (INDEPENDENT_AMBULATORY_CARE_PROVIDER_SITE_OTHER): Payer: Self-pay | Admitting: Family Medicine

## 2020-04-01 NOTE — Progress Notes (Signed)
Chief Complaint:   OBESITY Marisa Hamilton is here to discuss her progress with her obesity treatment plan along with follow-up of her obesity related diagnoses.   Today's visit was #: 13 Starting weight: 193 lbs Starting date: 06/10/2019 Today's weight: 190 lbs Today's date: 03/30/2020 Total lbs lost to date: 3 lbs Body mass index is 31.95 kg/m.  Total weight loss percentage to date: -1.55%  Interim History: I have reviewed Marisa Hamilton's GI note. Nutrition Plan: the [place] for 75% of the time. She is considering becoming Vegan.  Activity: Pilates for 50 minutes 3 times per week.  Assessment/Plan:   1. Chronic diarrhea Appreciate GI and thorough note. Will follow along re: further testing. Since Marisa Hamilton is committed to making changes recommended by GI, we focused on eliminating potential foods and supplements that could be contributing to her symptoms. We also completed a thorough medication reconciliation.   2. Polypharmacy We discontinued several medications and all supplements not necessary. I also recommended that she hold off on trying new teas (she just shopped for several). Updated medication list:  .  clonazePAM (KLONOPIN) 1 MG tablet, Take 1 tablet (1 mg total) by mouth at bedtime., Disp: 90 tablet, Rfl: 5 .  Famotidine (PEPCID PO), Take 20 mg by mouth as needed. , Disp: , Rfl:  .  ketorolac (TORADOL) 60 MG/2ML SOLN injection, Inject 60 mg into the muscle as needed (migraines). , Disp: , Rfl:  .  lamoTRIgine (LAMICTAL) 100 MG tablet, Take 1 tablet (100 mg total) by mouth daily., Disp: 90 tablet, Rfl: 1 .  Lasmiditan Succinate (REYVOW) 100 MG TABS, Take 100 mg by mouth as needed., Disp: , Rfl:  .  loratadine (CLARITIN) 10 MG tablet, Take 10 mg by mouth daily., Disp: , Rfl:  .  omeprazole (PRILOSEC) 40 MG capsule, Take 40 mg by mouth daily. , Disp: , Rfl:  .  Probiotic Product (PROBIOTIC PO), Take by mouth daily., Disp: , Rfl:  .  sertraline (ZOLOFT) 25 MG tablet, Take 1.5  tablets (37.5 mg total) by mouth daily. As of 08/25/19 reduced from 50 mg daily (Patient taking differently: Take 25 mg by mouth daily. Take 1.5 tablets qd), Disp: 135 tablet, Rfl: 1 .  tiZANidine (ZANAFLEX) 4 MG tablet, Take 1 tablet by mouth as needed., Disp: , Rfl:  .  topiramate (TOPAMAX) 50 MG tablet, Take 1 tablet (50 mg total) by mouth 2 (two) times daily., Disp: 60 tablet, Rfl: 0 .  traZODone (DESYREL) 100 MG tablet, Take 1 tablet (100 mg total) by mouth at bedtime., Disp: 90 tablet, Rfl: 1  3. Excessive vitamin d Last lab > 100. Hold vitamin D at this time. Recheck in 3-6 months.   4. Fibromyalgia Symptoms are mild and intermittent.  Marisa Hamilton takes tizanidine as needed. She continues regular exercise.   5. Other depression, with emotional eating Improving. Discussed cues and consequences, how thoughts affect eating, model of thoughts, feelings, and behaviors, and strategies for change by focusing on the cue. Discussed cognitive distortions, coping thoughts, and how to change your thoughts.  6. At risk for deficient intake of food Marisa Hamilton was given approximately 9 minutes of deficit intake of food prevention counseling today. Marisa Hamilton is at risk for eating too few calories based on current food recall. She was encouraged to focus on meeting caloric and protein goals according to her recommended meal plan.   7. Class 1 obesity with serious comorbidity and body mass index (BMI) of 31.0 to 31.9 in adult, unspecified  obesity type  Course: Marisa Hamilton is currently in the action stage of change. As such, her goal is to continue with weight loss efforts.   Nutrition goals: She has agreed to low FODMAP, considering vegan.   Exercise goals: As is.    Behavioral modification strategies: Discussed clean eating, low FODMAP, no new supplements.  Marisa Hamilton has agreed to follow-up with our clinic in 3-4 weeks. She was informed of the importance of frequent follow-up visits to maximize her success with intensive  lifestyle modifications for her multiple health conditions.   Objective:   Blood pressure 94/66, pulse 82, temperature 97.8 F (36.6 C), height 5' 5"  (1.651 m), weight 192 lb (87.1 kg), last menstrual period 03/10/2013, SpO2 97 %. Body mass index is 31.95 kg/m.  General: Cooperative, alert, well developed, in no acute distress. HEENT: Conjunctivae and lids unremarkable. Cardiovascular: Regular rhythm.  Lungs: Normal work of breathing. Neurologic: No focal deficits.   Lab Results  Component Value Date   CREATININE 0.73 10/06/2019   BUN 28 (H) 10/06/2019   NA 139 10/06/2019   K 5.0 10/06/2019   CL 101 10/06/2019   CO2 26 10/06/2019   Lab Results  Component Value Date   ALT 23 10/06/2019   AST 24 10/06/2019   ALKPHOS 67 10/06/2019   BILITOT <0.2 10/06/2019   Lab Results  Component Value Date   HGBA1C 5.6 10/06/2019   HGBA1C 5.7 (H) 06/10/2019   HGBA1C 5.6 08/04/2013   Lab Results  Component Value Date   INSULIN 22.9 06/10/2019   Lab Results  Component Value Date   TSH 2.550 06/10/2019   Lab Results  Component Value Date   CHOL 187 06/10/2019   HDL 68 06/10/2019   LDLCALC 89 06/10/2019   TRIG 181 (H) 06/10/2019   CHOLHDL 2.7 08/04/2013   Lab Results  Component Value Date   WBC 9.0 10/06/2019   HGB 14.0 10/06/2019   HCT 41.4 10/06/2019   MCV 89 10/06/2019   PLT 265 10/06/2019   Lab Results  Component Value Date   IRON 68 10/06/2019   TIBC 346 10/06/2019   FERRITIN 33 10/06/2019   Attestation Statements:   Reviewed by clinician on day of visit: allergies, medications, problem list, medical history, surgical history, family history, social history, and previous encounter notes.  I, Water quality scientist, CMA, am acting as transcriptionist for Briscoe Deutscher, DO  I have reviewed the above documentation for accuracy and completeness, and I agree with the above. Briscoe Deutscher, DO

## 2020-04-06 ENCOUNTER — Ambulatory Visit (INDEPENDENT_AMBULATORY_CARE_PROVIDER_SITE_OTHER): Payer: BC Managed Care – PPO | Admitting: Psychology

## 2020-04-06 DIAGNOSIS — F321 Major depressive disorder, single episode, moderate: Secondary | ICD-10-CM | POA: Diagnosis not present

## 2020-04-20 ENCOUNTER — Other Ambulatory Visit: Payer: Self-pay

## 2020-04-20 ENCOUNTER — Encounter: Payer: Self-pay | Admitting: Psychiatry

## 2020-04-20 ENCOUNTER — Ambulatory Visit: Payer: BC Managed Care – PPO | Admitting: Psychology

## 2020-04-20 ENCOUNTER — Ambulatory Visit (INDEPENDENT_AMBULATORY_CARE_PROVIDER_SITE_OTHER): Payer: BC Managed Care – PPO | Admitting: Psychiatry

## 2020-04-20 DIAGNOSIS — F900 Attention-deficit hyperactivity disorder, predominantly inattentive type: Secondary | ICD-10-CM

## 2020-04-20 DIAGNOSIS — F411 Generalized anxiety disorder: Secondary | ICD-10-CM | POA: Diagnosis not present

## 2020-04-20 DIAGNOSIS — F99 Mental disorder, not otherwise specified: Secondary | ICD-10-CM

## 2020-04-20 DIAGNOSIS — F3342 Major depressive disorder, recurrent, in full remission: Secondary | ICD-10-CM

## 2020-04-20 DIAGNOSIS — F331 Major depressive disorder, recurrent, moderate: Secondary | ICD-10-CM

## 2020-04-20 DIAGNOSIS — F5105 Insomnia due to other mental disorder: Secondary | ICD-10-CM | POA: Diagnosis not present

## 2020-04-20 MED ORDER — SERTRALINE HCL 25 MG PO TABS: 37.5000 mg | ORAL_TABLET | Freq: Every day | ORAL | 1 refills | Status: DC

## 2020-04-20 MED ORDER — TRAZODONE HCL 100 MG PO TABS: 100.0000 mg | ORAL_TABLET | Freq: Every day | ORAL | 1 refills | Status: DC
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
## 2020-04-26 ENCOUNTER — Ambulatory Visit (INDEPENDENT_AMBULATORY_CARE_PROVIDER_SITE_OTHER): Payer: BC Managed Care – PPO | Admitting: Family Medicine

## 2020-04-26 ENCOUNTER — Other Ambulatory Visit: Payer: Self-pay

## 2020-04-26 VITALS — BP 122/72 | HR 72 | Temp 98.1°F | Ht 65.0 in | Wt 188.0 lb

## 2020-04-26 DIAGNOSIS — M545 Low back pain, unspecified: Secondary | ICD-10-CM | POA: Diagnosis not present

## 2020-04-26 DIAGNOSIS — R632 Polyphagia: Secondary | ICD-10-CM | POA: Diagnosis not present

## 2020-04-26 DIAGNOSIS — R109 Unspecified abdominal pain: Secondary | ICD-10-CM | POA: Diagnosis not present

## 2020-04-26 DIAGNOSIS — E669 Obesity, unspecified: Secondary | ICD-10-CM

## 2020-04-26 DIAGNOSIS — Z6831 Body mass index (BMI) 31.0-31.9, adult: Secondary | ICD-10-CM

## 2020-04-26 DIAGNOSIS — Z9189 Other specified personal risk factors, not elsewhere classified: Secondary | ICD-10-CM

## 2020-04-27 NOTE — Progress Notes (Signed)
Chief Complaint:   OBESITY Marisa Hamilton is here to discuss her progress with her obesity treatment plan along with follow-up of her obesity related diagnoses.   Today's visit was #: 14 Starting weight: 193 lbs Starting date: 06/10/2019 Today's weight: 188 lbs Today's date: 04/26/2020 Total lbs lost to date: 5 lbs Body mass index is 31.28 kg/m.  Total weight loss percentage to date: -2.59%  Interim History: Marisa Hamilton says, "I'm hungry".  More protein is needed.  She has decreased her intake of dairy, and subsequently, diarrhea has decreased.  She is using 2 tbs of Benefiber daily.  Scheduled for a colonoscopy on January 5.  She states she is having some back pain with some left neck and hip pain.  Nutrition Plan: the [place] for 65% of the time.  Anti-obesity medications: topiramate 50 mg daily. Reported side effects: None. Hunger is poorly controlled.   Activity: Pilates for 60 minutes 3 times per week.  Assessment/Plan:   1. Left-sided low back pain without sciatica, unspecified chronicity Chronic issue with acute exacerbation.  Discussed symptomatic treatment.  Choosing appropriate footwear.  Discussed more PT.  2. Abdominal pain With diarrhea.    Improving.  Marisa Hamilton feels that the decrease in dairy intake has helped the most.  3. Polyphagia Marisa Hamilton is having a hard time getting in protein with her new diet restrictions.  She will continue to focus on protein-rich, low simple carbohydrate foods. We reviewed the importance of hydration, regular exercise for stress reduction, and restorative sleep.  4. At risk for activity intolerance Marisa Hamilton was given approximately 9 minutes of exercise intolerance counseling today. She is 59 y.o. female and has risk factors exercise intolerance including obesity and back pain. We discussed intensive lifestyle modifications today with an emphasis on specific weight loss instructions and strategies. Marisa Hamilton will slowly increase activity as  tolerated.  5. Class 1 obesity with serious comorbidity and body mass index (BMI) of 31.0 to 31.9 in adult, unspecified obesity type  Course: Marisa Hamilton is currently in the action stage of change. As such, her goal is to continue with weight loss efforts.   Nutrition goals: She has agreed to the [place].   Exercise goals: As is.  Behavioral modification strategies: increasing lean protein intake, decreasing simple carbohydrates, increasing vegetables and increasing water intake.  Marisa Hamilton has agreed to follow-up with our clinic in 4 weeks. She was informed of the importance of frequent follow-up visits to maximize her success with intensive lifestyle modifications for her multiple health conditions.   Objective:   Blood pressure 122/72, pulse 72, temperature 98.1 F (36.7 C), temperature source Oral, height 5' 5"  (1.651 m), weight 188 lb (85.3 kg), last menstrual period 03/10/2013, SpO2 97 %. Body mass index is 31.28 kg/m.  General: Cooperative, alert, well developed, in no acute distress. HEENT: Conjunctivae and lids unremarkable. Cardiovascular: Regular rhythm.  Lungs: Normal work of breathing. Neurologic: No focal deficits.   Lab Results  Component Value Date   CREATININE 0.73 10/06/2019   BUN 28 (H) 10/06/2019   NA 139 10/06/2019   K 5.0 10/06/2019   CL 101 10/06/2019   CO2 26 10/06/2019   Lab Results  Component Value Date   ALT 23 10/06/2019   AST 24 10/06/2019   ALKPHOS 67 10/06/2019   BILITOT <0.2 10/06/2019   Lab Results  Component Value Date   HGBA1C 5.6 10/06/2019   HGBA1C 5.7 (H) 06/10/2019   HGBA1C 5.6 08/04/2013   Lab Results  Component Value Date  INSULIN 22.9 06/10/2019   Lab Results  Component Value Date   TSH 2.550 06/10/2019   Lab Results  Component Value Date   CHOL 187 06/10/2019   HDL 68 06/10/2019   LDLCALC 89 06/10/2019   TRIG 181 (H) 06/10/2019   CHOLHDL 2.7 08/04/2013   Lab Results  Component Value Date   WBC 9.0 10/06/2019    HGB 14.0 10/06/2019   HCT 41.4 10/06/2019   MCV 89 10/06/2019   PLT 265 10/06/2019   Lab Results  Component Value Date   IRON 68 10/06/2019   TIBC 346 10/06/2019   FERRITIN 33 10/06/2019   Attestation Statements:   Reviewed by clinician on day of visit: allergies, medications, problem list, medical history, surgical history, family history, social history, and previous encounter notes.  I, Water quality scientist, CMA, am acting as transcriptionist for Briscoe Deutscher, DO  I have reviewed the above documentation for accuracy and completeness, and I agree with the above. Briscoe Deutscher, DO

## 2020-05-04 ENCOUNTER — Ambulatory Visit (INDEPENDENT_AMBULATORY_CARE_PROVIDER_SITE_OTHER): Payer: BC Managed Care – PPO | Admitting: Psychology

## 2020-05-04 DIAGNOSIS — F321 Major depressive disorder, single episode, moderate: Secondary | ICD-10-CM | POA: Diagnosis not present

## 2020-05-06 ENCOUNTER — Encounter (INDEPENDENT_AMBULATORY_CARE_PROVIDER_SITE_OTHER): Payer: Self-pay | Admitting: Family Medicine

## 2020-05-18 ENCOUNTER — Ambulatory Visit: Payer: BC Managed Care – PPO | Admitting: Psychology

## 2020-05-19 ENCOUNTER — Encounter: Payer: BC Managed Care – PPO | Admitting: Gastroenterology

## 2020-05-21 ENCOUNTER — Other Ambulatory Visit: Payer: Self-pay

## 2020-05-21 ENCOUNTER — Other Ambulatory Visit: Payer: BC Managed Care – PPO

## 2020-05-21 DIAGNOSIS — Z20822 Contact with and (suspected) exposure to covid-19: Secondary | ICD-10-CM

## 2020-05-24 LAB — NOVEL CORONAVIRUS, NAA: SARS-CoV-2, NAA: NOT DETECTED

## 2020-05-26 ENCOUNTER — Ambulatory Visit (INDEPENDENT_AMBULATORY_CARE_PROVIDER_SITE_OTHER): Payer: BC Managed Care – PPO | Admitting: Family Medicine

## 2020-05-27 ENCOUNTER — Ambulatory Visit: Payer: BC Managed Care – PPO | Admitting: Psychology

## 2020-06-01 ENCOUNTER — Ambulatory Visit: Payer: BC Managed Care – PPO | Admitting: Psychology

## 2020-06-03 ENCOUNTER — Ambulatory Visit: Payer: BC Managed Care – PPO | Admitting: Psychology

## 2020-06-04 ENCOUNTER — Ambulatory Visit
Admission: RE | Admit: 2020-06-04 | Discharge: 2020-06-04 | Disposition: A | Discharge status: Home / Self Care | Payer: BC Managed Care – PPO | Source: Ambulatory Visit | Attending: Family Medicine | Admitting: Family Medicine

## 2020-06-04 ENCOUNTER — Other Ambulatory Visit: Payer: Self-pay

## 2020-06-04 ENCOUNTER — Other Ambulatory Visit: Payer: Self-pay | Admitting: Family Medicine

## 2020-06-04 DIAGNOSIS — R079 Chest pain, unspecified: Secondary | ICD-10-CM

## 2020-06-07 ENCOUNTER — Other Ambulatory Visit: Payer: Self-pay

## 2020-06-07 ENCOUNTER — Ambulatory Visit (INDEPENDENT_AMBULATORY_CARE_PROVIDER_SITE_OTHER): Payer: BC Managed Care – PPO | Admitting: *Deleted

## 2020-06-07 DIAGNOSIS — B351 Tinea unguium: Secondary | ICD-10-CM

## 2020-06-07 NOTE — Progress Notes (Signed)
Patient presents today for the 6th laser treatment. Diagnosed with mycotic nail infection by Dr. Cannon Kettle.   Toenail most affected hallux nails bilateral (R>L). Both nails are completely grown out and clear. She is very happy with the results.  All other systems are negative.  Nails were filed thin. Laser therapy was administered to 1st toenails bilateral and patient tolerated the treatment well. All safety precautions were in place.   She is also using a topical compound as well.   Patient has completed the recommended laser treatments. He will follow up with Dr. Cannon Kettle in 2 months to evaluate progress.     ~Final nail pic taken today~

## 2020-06-15 ENCOUNTER — Ambulatory Visit: Payer: BC Managed Care – PPO | Admitting: Psychology

## 2020-06-17 ENCOUNTER — Ambulatory Visit (INDEPENDENT_AMBULATORY_CARE_PROVIDER_SITE_OTHER): Payer: BC Managed Care – PPO | Admitting: Psychology

## 2020-06-17 DIAGNOSIS — F321 Major depressive disorder, single episode, moderate: Secondary | ICD-10-CM | POA: Diagnosis not present

## 2020-06-21 ENCOUNTER — Other Ambulatory Visit: Payer: Self-pay | Admitting: Nurse Practitioner

## 2020-06-21 DIAGNOSIS — Z1231 Encounter for screening mammogram for malignant neoplasm of breast: Secondary | ICD-10-CM

## 2020-06-23 ENCOUNTER — Other Ambulatory Visit: Payer: Self-pay

## 2020-06-23 ENCOUNTER — Ambulatory Visit (AMBULATORY_SURGERY_CENTER): Payer: BC Managed Care – PPO | Admitting: *Deleted

## 2020-06-23 VITALS — Ht 65.0 in | Wt 190.0 lb

## 2020-06-23 DIAGNOSIS — R1013 Epigastric pain: Secondary | ICD-10-CM

## 2020-06-23 DIAGNOSIS — R197 Diarrhea, unspecified: Secondary | ICD-10-CM

## 2020-06-23 NOTE — Progress Notes (Signed)
Patient's pre-visit was done today over the phone with the patient due to COVID-19 pandemic. Name,DOB and address verified. Insurance verified. Patient denies any allergies to Eggs and Soy. Patient denies any problems with anesthesia/sedation. Patient denies taking diet pills or blood thinners. Packet of Prep instructions mailed to patient including a copy of a consent form and pre-procedure patient acknowledgement form (with envelope to mail back to us)-pt is aware. Patient understands to call us back with any questions or concerns. Patient is aware of our care-partner policy and VLRTJ-40 safety protocol. EMMI education assigned to the patient for the procedure, sent to [place].   COVID-19 vaccines completed on x 2 J&J, per patient.   Patient has suprep at home.

## 2020-06-29 ENCOUNTER — Ambulatory Visit: Payer: BC Managed Care – PPO | Admitting: Psychology

## 2020-07-01 ENCOUNTER — Ambulatory Visit (INDEPENDENT_AMBULATORY_CARE_PROVIDER_SITE_OTHER): Payer: BC Managed Care – PPO | Admitting: Psychology

## 2020-07-01 DIAGNOSIS — F321 Major depressive disorder, single episode, moderate: Secondary | ICD-10-CM

## 2020-07-06 NOTE — Progress Notes (Signed)
60 y.o. [id number] Married White or Caucasian female here for annual exam.      Patient's last menstrual period was 03/10/2013.          Sexually active: Yes.    The current method of family planning is vasectomy.    Exercising: Yes.    pilates Smoker:  no  Health Maintenance: Pap:  08-23-16 neg, 11-20-2018 neg HPV HR neg History of abnormal Pap:  no MMG:  10-30-2018 category b density birads 1:neg, scheduled for 08-10-2020 Colonoscopy:  10-22-15, scheduled for 07-15-3020 BMD:   02-06-2019 low bone mass in rt hip TDaP:  2020 Gardasil:   n/a Covid-19: J&J Hep C testing: neg 2017 Screening Labs: with PCP   reports that she has never smoked. She has never used smokeless tobacco. She reports previous alcohol use. She reports that she does not use drugs.  Past Medical History:  Diagnosis Date  . Anxiety   . Back pain   . CFS (chronic fatigue syndrome)   . Concussion 03-17-17  . Constipation   . Cystitis, interstitial    HAD BLADDER SX FOR SAME  . Depression   . Fainting spell   . Fibromyalgia   . Food allergy   . Foot pain   . GERD (gastroesophageal reflux disease)   . Headache(784.0)   . IBS (irritable bowel syndrome)    TAKES PROBIOTICS  . Insomnia   . Lactose intolerance   . Leg pain   . Migraine   . MVP (mitral valve prolapse)   . Nausea   . Neck pain   . PONV (postoperative nausea and vomiting)   . Restless leg syndrome   . Stomach ache   . Ulcerative colitis ([place])   . Varicose veins     Past Surgical History:  Procedure Laterality Date  . ABDOMINOPLASTY  4/14  . BLADDER SURGERY     FOR IC  . BREAST BIOPSY Left 02-26-12   negative  . BREAST REDUCTION SURGERY  10/10/2011   Procedure: MAMMARY REDUCTION  (BREAST);  Surgeon: Charlene Brooke, MD;  Location: [facility];  Service: Plastics;  Laterality: Bilateral;  . COLONOSCOPY  10/22/2015   Dr.Mann  . LASIK    . NASAL SEPTUM SURGERY    . REDUCTION MAMMAPLASTY Bilateral   . VASCULAR SURGERY      VARICOSE VEINS    Current Outpatient Medications  Medication Sig Dispense Refill  . clonazePAM (KLONOPIN) 1 MG tablet Take 1 tablet (1 mg total) by mouth at bedtime. 90 tablet 5  . ketorolac (TORADOL) 60 MG/2ML SOLN injection Inject 60 mg into the muscle as needed (migraines).     [name] lamoTRIgine (LAMICTAL) 100 MG tablet Take 1 tablet (100 mg total) by mouth daily. 90 tablet 1  . Lasmiditan Succinate (REYVOW) 100 MG TABS Take 100 mg by mouth as needed.    . loratadine (CLARITIN) 10 MG tablet Take 10 mg by mouth daily.    [name] omeprazole (PRILOSEC) 40 MG capsule Take 40 mg by mouth daily.     . ondansetron (ZOFRAN) 8 MG tablet Take 8 mg by mouth as needed.    . sertraline (ZOLOFT) 25 MG tablet Take 1.5 tablets (37.5 mg total) by mouth daily. As of 08/25/19 reduced from 50 mg daily 135 tablet 1  . tiZANidine (ZANAFLEX) 4 MG tablet Take 1 tablet by mouth as needed.    . topiramate (TOPAMAX) 50 MG tablet Take 1 tablet (50 mg total) by mouth 2 (two) times daily. (Patient  taking differently: Take 50 mg by mouth daily.) 60 tablet 0  . traZODone (DESYREL) 100 MG tablet Take 1 tablet (100 mg total) by mouth at bedtime. 90 tablet 1  . Wheat Dextrin (BENEFIBER PO) Take by mouth.     No current facility-administered medications for this visit.    Family History  Problem Relation Age of Onset  . Osteoporosis Mother   . Atrial fibrillation Mother   . Heart disease Mother   . Stroke Mother   . Depression Mother   . Anxiety disorder Mother   . Obesity Mother   . Diabetes Father   . Hypertension Father   . Depression Father   . Obesity Father   . Breast cancer Maternal Grandmother   . Cancer Maternal Grandmother        ovarian  . Diabetes Paternal Grandmother   . Migraines Brother        CHRONIC  . Colon cancer Neg Hx   . Esophageal cancer Neg Hx   . Pancreatic cancer Neg Hx   . Stomach cancer Neg Hx   . Liver disease Neg Hx   . Rectal cancer Neg Hx   . Colon polyps Neg Hx     Review of  Systems  Constitutional: Negative.   HENT: Negative.   Eyes: Negative.   Respiratory: Negative.   Cardiovascular: Negative.   Gastrointestinal: Negative.   Endocrine: Negative.   Genitourinary: Negative.   Musculoskeletal: Negative.   Skin: Negative.   Allergic/Immunologic: Negative.   Neurological: Negative.   Hematological: Negative.   Psychiatric/Behavioral: Negative.     Exam:   BP 112/68   Pulse 70   Resp 16   Ht 5' 5.25" (1.657 m)   Wt 196 lb (88.9 kg)   LMP 03/10/2013   BMI 32.37 kg/m   Height: 5' 5.25" (165.7 cm)  General appearance: alert, cooperative and appears stated age, no acute distress Head: Normocephalic, without obvious abnormality Neck: no adenopathy, thyroid normal to inspection and palpation Lungs: clear to auscultation bilaterally Breasts:Breast reduction surgery scars left breast with palpable cystic type mass 12 o'clock near areolar border Heart: regular rate and rhythm Abdomen: soft, non-tender; no masses,  no organomegaly Extremities: extremities normal, no edema Skin: No rashes or lesions Lymph nodes: Cervical, supraclavicular, and axillary nodes normal. No abnormal inguinal nodes palpated Neurologic: Grossly normal   Pelvic: External genitalia:  no lesions              Urethra:  normal appearing urethra with no masses, tenderness or lesions              Bartholins and Skenes: normal                 Vagina: normal appearing vagina, appropriate for age, normal appearing discharge, no lesions              Cervix: neg cervical motion tenderness, no visible lesions             Bimanual Exam:   Uterus:  normal size, contour, position, consistency, mobility, non-tender              Adnexa: no mass, fullness, tenderness                  A:  Well Woman with normal exam  Osteopenia  Breast density-cystic structure, Left breast  P:   Pap :HPV and Pap smear due 2025  Mammogram: diagnostic to be scheduled.   Labs: with PCP  Medications:  Calcium 120 73m and Vitamin D 1000 IU  [email or web address]

## 2020-07-08 ENCOUNTER — Encounter: Payer: Self-pay | Admitting: Nurse Practitioner

## 2020-07-08 ENCOUNTER — Ambulatory Visit (INDEPENDENT_AMBULATORY_CARE_PROVIDER_SITE_OTHER): Payer: BC Managed Care – PPO | Admitting: Nurse Practitioner

## 2020-07-08 ENCOUNTER — Other Ambulatory Visit: Payer: Self-pay

## 2020-07-08 VITALS — BP 112/68 | HR 70 | Resp 16 | Ht 65.25 in | Wt 196.0 lb

## 2020-07-08 DIAGNOSIS — R922 Inconclusive mammogram: Secondary | ICD-10-CM

## 2020-07-08 DIAGNOSIS — M85851 Other specified disorders of bone density and structure, right thigh: Secondary | ICD-10-CM

## 2020-07-08 DIAGNOSIS — Z01419 Encounter for gynecological examination (general) (routine) without abnormal findings: Secondary | ICD-10-CM | POA: Diagnosis not present

## 2020-07-08 NOTE — Patient Instructions (Addendum)
Pap :HPV and Pap smear due 2025  Mammogram: diagnostic to be scheduled. You should hear from some one soon. If you don't get a call in 1  Week, please call office  Labs: with PCP  Medications: Calcium 1231m and Vitamin D 1000 IU  D[facility]: [facility] (You should get a call, but if you dont, you can call them and  Schedule)   Consider: [facility]aintenance for Postmenopausal Women Menopause is a normal process in which your ability to get pregnant comes to an end. This process happens slowly over many months or years, usually between the ages of 440and 533 Menopause is complete when you have missed your menstrual periods for 12 months. It is important to talk with your health care provider about some of the most common conditions that affect women after menopause (postmenopausal women). These include heart disease, cancer, and bone loss (osteoporosis). Adopting a healthy lifestyle and getting preventive care can help to promote your health and wellness. The actions you take can also lower your chances of developing some of these common conditions. What should I know about menopause? During menopause, you may get a number of symptoms, such as:  Hot flashes. These can be moderate or severe.  Night sweats.  Decrease in sex drive.  Mood swings.  Headaches.  Tiredness.  Irritability.  Memory problems.  Insomnia. Choosing to treat or not to treat these symptoms is a decision that you make with your health care provider. Do I need hormone replacement therapy?  Hormone replacement therapy is effective in treating symptoms that are caused by menopause, such as hot flashes and night sweats.  Hormone replacement carries certain risks, especially as you become older. If you are thinking about using estrogen or estrogen with progestin, discuss the benefits and risks with your health care provider. What is my risk for heart disease and stroke? The risk  of heart disease, heart attack, and stroke increases as you age. One of the causes may be a change in the body's hormones during menopause. This can affect how your body uses dietary fats, triglycerides, and cholesterol. Heart attack and stroke are medical emergencies. There are many things that you can do to help prevent heart disease and stroke. Watch your blood pressure  High blood pressure causes heart disease and increases the risk of stroke. This is more likely to develop in people who have high blood pressure readings, are of African descent, or are overweight.  Have your blood pressure checked: ? Every 3-5 years if you are 114319years of age. ? Every year if you are 474years old or older. Eat a healthy diet  Eat a diet that includes plenty of vegetables, fruits, low-fat dairy products, and lean protein.  Do not eat a lot of foods that are high in solid fats, added sugars, or sodium.   Get regular exercise Get regular exercise. This is one of the most important things you can do for your health. Most adults should:  Try to exercise for at least 150 minutes each week. The exercise should increase your heart rate and make you sweat (moderate-intensity exercise).  Try to do strengthening exercises at least twice each week. Do these in addition to the moderate-intensity exercise.  Spend less time sitting. Even light physical activity can be beneficial. Other tips  Work with your health care provider to achieve or maintain a healthy weight.  Do not use any products that contain nicotine  or tobacco, such as cigarettes, e-cigarettes, and chewing tobacco. If you need help quitting, ask your health care provider.  Know your numbers. Ask your health care provider to check your cholesterol and your blood sugar (glucose). Continue to have your blood tested as directed by your health care provider. Do I need screening for cancer? Depending on your health history and family history, you may  need to have cancer screening at different stages of your life. This may include screening for:  Breast cancer.  Cervical cancer.  Lung cancer.  Colorectal cancer. What is my risk for osteoporosis? After menopause, you may be at increased risk for osteoporosis. Osteoporosis is a condition in which bone destruction happens more quickly than new bone creation. To help prevent osteoporosis or the bone fractures that can happen because of osteoporosis, you may take the following actions:  If you are 60-64 years old, get at least 1,000 mg of calcium and at least 600 mg of vitamin D per day.  If you are older than age 66 but younger than age 6, get at least 1,200 mg of calcium and at least 600 mg of vitamin D per day.  If you are older than age 40, get at least 1,200 mg of calcium and at least 800 mg of vitamin D per day. Smoking and drinking excessive alcohol increase the risk of osteoporosis. Eat foods that are rich in calcium and vitamin D, and do weight-bearing exercises several times each week as directed by your health care provider. How does menopause affect my mental health? Depression may occur at any age, but it is more common as you become older. Common symptoms of depression include:  Low or sad mood.  Changes in sleep patterns.  Changes in appetite or eating patterns.  Feeling an overall lack of motivation or enjoyment of activities that you previously enjoyed.  Frequent crying spells. Talk with your health care provider if you think that you are experiencing depression. General instructions See your health care provider for regular wellness exams and vaccines. This may include:  Scheduling regular health, dental, and eye exams.  Getting and maintaining your vaccines. These include: ? Influenza vaccine. Get this vaccine each year before the flu season begins. ? Pneumonia vaccine. ? Shingles vaccine. ? Tetanus, diphtheria, and pertussis (Tdap) booster vaccine. Your  health care provider may also recommend other immunizations. Tell your health care provider if you have ever been abused or do not feel safe at home. Summary  Menopause is a normal process in which your ability to get pregnant comes to an end.  This condition causes hot flashes, night sweats, decreased interest in sex, mood swings, headaches, or lack of sleep.  Treatment for this condition may include hormone replacement therapy.  Take actions to keep yourself healthy, including exercising regularly, eating a healthy diet, watching your weight, and checking your blood pressure and blood sugar levels.  Get screened for cancer and depression. Make sure that you are up to date with all your vaccines. This information is not intended to replace advice given to you by your health care provider. Make sure you discuss any questions you have with your health care provider. Document Revised: 04/24/2018 Document Reviewed: 04/24/2018 Elsevier Patient Education  2021 [employer].

## 2020-07-09 ENCOUNTER — Telehealth: Payer: Self-pay | Admitting: *Deleted

## 2020-07-09 ENCOUNTER — Encounter: Payer: Self-pay | Admitting: Gastroenterology

## 2020-07-09 DIAGNOSIS — R922 Inconclusive mammogram: Secondary | ICD-10-CM

## 2020-07-09 DIAGNOSIS — N6320 Unspecified lump in the left breast, unspecified quadrant: Secondary | ICD-10-CM

## 2020-07-09 NOTE — Telephone Encounter (Signed)
-----   Message from Karma Ganja, NP sent at 07/08/2020  2:22 PM EST ----- Please schedule diagnostic mammogram and left breast US This patient already has screening mammogram scheduled on 08/10/2020 at the breast center but has an area of concern Left breast 12 o'clock areolar border

## 2020-07-09 NOTE — Telephone Encounter (Signed)
Patient mammogram canceled, now scheduled for the below imaging on 08/20/20 @ 2:40pm at the breast center. Aware she can call daily/weekly to check for sooner appointment.

## 2020-07-13 ENCOUNTER — Ambulatory Visit: Payer: BC Managed Care – PPO | Admitting: Psychology

## 2020-07-15 ENCOUNTER — Encounter: Payer: Self-pay | Admitting: Gastroenterology

## 2020-07-15 ENCOUNTER — Ambulatory Visit (AMBULATORY_SURGERY_CENTER): Payer: BC Managed Care – PPO | Admitting: Gastroenterology

## 2020-07-15 ENCOUNTER — Other Ambulatory Visit: Payer: Self-pay

## 2020-07-15 ENCOUNTER — Ambulatory Visit: Payer: BC Managed Care – PPO | Admitting: Psychology

## 2020-07-15 VITALS — BP 118/67 | HR 63 | Temp 96.6°F | Resp 19 | Ht 65.0 in | Wt 190.0 lb

## 2020-07-15 DIAGNOSIS — K5730 Diverticulosis of large intestine without perforation or abscess without bleeding: Secondary | ICD-10-CM

## 2020-07-15 DIAGNOSIS — Z86010 Personal history of colonic polyps: Secondary | ICD-10-CM

## 2020-07-15 DIAGNOSIS — R1013 Epigastric pain: Secondary | ICD-10-CM | POA: Diagnosis not present

## 2020-07-15 DIAGNOSIS — K648 Other hemorrhoids: Secondary | ICD-10-CM | POA: Diagnosis not present

## 2020-07-15 DIAGNOSIS — D122 Benign neoplasm of ascending colon: Secondary | ICD-10-CM

## 2020-07-15 DIAGNOSIS — R109 Unspecified abdominal pain: Secondary | ICD-10-CM

## 2020-07-15 DIAGNOSIS — R1310 Dysphagia, unspecified: Secondary | ICD-10-CM | POA: Diagnosis not present

## 2020-07-15 DIAGNOSIS — K222 Esophageal obstruction: Secondary | ICD-10-CM | POA: Diagnosis not present

## 2020-07-15 DIAGNOSIS — D120 Benign neoplasm of cecum: Secondary | ICD-10-CM

## 2020-07-15 DIAGNOSIS — R197 Diarrhea, unspecified: Secondary | ICD-10-CM

## 2020-07-15 MED ORDER — OMEPRAZOLE 40 MG PO CPDR: 40.0000 mg | DELAYED_RELEASE_CAPSULE | Freq: Every day | ORAL | 3 refills | Status: DC

## 2020-07-15 MED ORDER — SODIUM CHLORIDE 0.9 % IV SOLN: 500.0000 mL | INTRAVENOUS | Status: DC

## 2020-07-15 NOTE — Progress Notes (Signed)
Called to room to assist during endoscopic procedure.  Patient ID and intended procedure confirmed with present staff. Received instructions for my participation in the procedure from the performing physician.  

## 2020-07-15 NOTE — Op Note (Signed)
[facility] Patient Name: Marisa Hamilton Procedure Date: 07/15/2020 7:22 AM MRN: [id number] Endoscopist: Mauri Pole , MD Age: 60 Referring MD:  Date of Birth: [birth year] Gender: Female Account #: [id number] Procedure:                Colonoscopy Indications:              High risk colon cancer surveillance: Personal                            history of colonic polyps, High risk colon cancer                            surveillance: Personal history of adenoma less than                            10 mm in size, Last colonoscopy: 2017, Incidental                            diarrhea noted Medicines:                Monitored Anesthesia Care Procedure:                Pre-Anesthesia Assessment:                           - Prior to the procedure, a History and Physical                            was performed, and patient medications and                            allergies were reviewed. The patient's tolerance of                            previous anesthesia was also reviewed. The risks                            and benefits of the procedure and the sedation                            options and risks were discussed with the patient.                            All questions were answered, and informed consent                            was obtained. Prior Anticoagulants: The patient has                            taken no previous anticoagulant or antiplatelet                            agents. ASA Grade Assessment: II - A patient with  mild systemic disease. After reviewing the risks                            and benefits, the patient was deemed in                            satisfactory condition to undergo the procedure.                           After obtaining informed consent, the colonoscope                            was passed under direct vision. Throughout the                            procedure, the patient's blood pressure, pulse,  and                            oxygen saturations were monitored continuously. The                            Olympus PCF-H190DL ([id number]) Colonoscope was                            introduced through the anus and advanced to the the                            terminal ileum, with identification of the                            appendiceal orifice and IC valve. The colonoscopy                            was performed without difficulty. The patient                            tolerated the procedure well. The quality of the                            bowel preparation was excellent. The terminal                            ileum, ileocecal valve, appendiceal orifice, and                            rectum were photographed. Scope In: 8:20:32 AM Scope Out: 8:42:11 AM Scope Withdrawal Time: 0 hours 18 minutes 10 seconds  Total Procedure Duration: 0 hours 21 minutes 39 seconds  Findings:                 The perianal and digital rectal examinations were                            normal.  Three sessile polyps were found in the ascending                            colon and cecum. The polyps were 5 to 11 mm in                            size. These polyps were removed with a cold snare.                            Resection and retrieval were complete.                           A few small-mouthed diverticula were found in the                            sigmoid colon.                           Non-bleeding internal hemorrhoids were found during                            retroflexion. The hemorrhoids were medium-sized.                           The exam was otherwise without abnormality. Complications:            No immediate complications. Estimated Blood Loss:     Estimated blood loss was minimal. Impression:               - Three 5 to 11 mm polyps in the ascending colon                            and in the cecum, removed with a cold snare.                             Resected and retrieved.                           - Diverticulosis in the sigmoid colon.                           - Non-bleeding internal hemorrhoids.                           - The examination was otherwise normal. Recommendation:           - Patient has a contact number available for                            emergencies. The signs and symptoms of potential                            delayed complications were discussed with the  patient. Return to normal activities tomorrow.                            Written discharge instructions were provided to the                            patient.                           - Resume previous diet.                           - Continue present medications.                           - Await pathology results.                           - Repeat colonoscopy in 3 - 5 years for                            surveillance based on pathology results.                           - Return to my office at appointment to be                            scheduled for hemorrhoid band ligation. Mauri Pole, MD 07/15/2020 8:55:36 AM This report has been signed electronically.

## 2020-07-15 NOTE — Patient Instructions (Signed)
YOU HAD AN ENDOSCOPIC PROCEDURE TODAY AT [facility]:   Refer to the procedure report that was given to you for any specific questions about what was found during the examination.  If the procedure report does not answer your questions, please call your gastroenterologist to clarify.  If you requested that your care partner not be given the details of your procedure findings, then the procedure report has been included in a sealed envelope for you to review at your convenience later.  YOU SHOULD EXPECT: Some feelings of bloating in the abdomen. Passage of more gas than usual.  Walking can help get rid of the air that was put into your GI tract during the procedure and reduce the bloating. If you had a lower endoscopy (such as a colonoscopy or flexible sigmoidoscopy) you may notice spotting of blood in your stool or on the toilet paper. If you underwent a bowel prep for your procedure, you may not have a normal bowel movement for a few days.  **Handouts given on polyps, diverticulosis, and hemorrhoids**  Please Note:  You might notice some irritation and congestion in your nose or some drainage.  This is from the oxygen used during your procedure.  There is no need for concern and it should clear up in a day or so.  SYMPTOMS TO REPORT IMMEDIATELY:   Following lower endoscopy (colonoscopy or flexible sigmoidoscopy):  Excessive amounts of blood in the stool  Significant tenderness or worsening of abdominal pains  Swelling of the abdomen that is new, acute  Fever of 100F or higher   Following upper endoscopy (EGD)  Vomiting of blood or coffee ground material  New chest pain or pain under the shoulder blades  Painful or persistently difficult swallowing  New shortness of breath  Fever of 100F or higher  Black, tarry-looking stools  For urgent or emergent issues, a gastroenterologist can be reached at any hour by calling [phone]. Do not use MyChart messaging for urgent  concerns.    DIET:  We do recommend a small meal at first, but then you may proceed to your regular diet.  Drink plenty of fluids but you should avoid alcoholic beverages for 24 hours.  ACTIVITY:  You should plan to take it easy for the rest of today and you should NOT DRIVE or use heavy machinery until tomorrow (because of the sedation medicines used during the test).    FOLLOW UP: Our staff will call the number listed on your records 48-72 hours following your procedure to check on you and address any questions or concerns that you may have regarding the information given to you following your procedure. If we do not reach you, we will leave a message.  We will attempt to reach you two times.  During this call, we will ask if you have developed any symptoms of COVID 19. If you develop any symptoms (ie: fever, flu-like symptoms, shortness of breath, cough etc.) before then, please call [phone].  If you test positive for Covid 19 in the 2 weeks post procedure, please call and report this information to [place].    If any biopsies were taken you will be contacted by phone or by letter within the next 1-3 weeks.  Please call us at [phone] if you have not heard about the biopsies in 3 weeks.    SIGNATURES/CONFIDENTIALITY: You and/or your care partner have signed paperwork which will be entered into your electronic medical record.  These signatures attest to  the fact that that the information above on your After Visit Summary has been reviewed and is understood.  Full responsibility of the confidentiality of this discharge information lies with you and/or your care-partner.

## 2020-07-15 NOTE — Progress Notes (Signed)
[facility] by [facility] states no medical or surgical changes since previsit or office visit.

## 2020-07-15 NOTE — Op Note (Signed)
[place] Patient Name: Marisa Hamilton Procedure Date: 07/15/2020 7:23 AM MRN: [id number] Endoscopist: Mauri Pole , MD Age: 60 Referring MD:  Date of Birth: July 03, 1960 Gender: Female Account #: [id number] Procedure:                Upper GI endoscopy Indications:              Dysphagia, Epigastric abdominal pain Medicines:                Monitored Anesthesia Care Procedure:                Pre-Anesthesia Assessment:                           - Prior to the procedure, a History and Physical                            was performed, and patient medications and                            allergies were reviewed. The patient's tolerance of                            previous anesthesia was also reviewed. The risks                            and benefits of the procedure and the sedation                            options and risks were discussed with the patient.                            All questions were answered, and informed consent                            was obtained. Prior Anticoagulants: The patient has                            taken no previous anticoagulant or antiplatelet                            agents. ASA Grade Assessment: II - A patient with                            mild systemic disease. After reviewing the risks                            and benefits, the patient was deemed in                            satisfactory condition to undergo the procedure.                           After obtaining informed consent, the endoscope was  passed under direct vision. Throughout the                            procedure, the patient's blood pressure, pulse, and                            oxygen saturations were monitored continuously. The                            Endoscope was introduced through the mouth, and                            advanced to the second part of duodenum. The upper                            GI endoscopy was  accomplished without difficulty.                            The patient tolerated the procedure well. Scope In: Scope Out: Findings:                 The gastroesophageal flap valve was visualized                            endoscopically and classified as Hill Grade III                            (minimal fold, loose to endoscope, hiatal hernia                            likely).                           A mild Schatzki ring was found in the distal                            esophagus. The scope was withdrawn. Dilation was                            performed with a Maloney dilator with no resistance                            at 68 Fr. The dilation site was examined following                            endoscope reinsertion and showed no change.                           The Z-line was regular and was found 37 cm from the                            incisors.                           The stomach was  normal.                           The examined duodenum was normal. Complications:            No immediate complications. Estimated Blood Loss:     Estimated blood loss was minimal. Impression:               - Mild Schatzki ring. Dilated.                           - Z-line regular, 37 cm from the incisors.                           - Normal stomach.                           - Normal examined duodenum.                           - No specimens collected. Recommendation:           - Patient has a contact number available for                            emergencies. The signs and symptoms of potential                            delayed complications were discussed with the                            patient. Return to normal activities tomorrow.                            Written discharge instructions were provided to the                            patient.                           - Resume previous diet.                           - Continue present medications.                           -  Follow an antireflux regimen.                           - See the other procedure note for documentation of                            additional recommendations. Mauri Pole, MD 07/15/2020 8:50:38 AM This report has been signed electronically.

## 2020-07-15 NOTE — Progress Notes (Signed)
Report to PACU, RN, vss, BBS= Clear.  

## 2020-07-19 ENCOUNTER — Telehealth: Payer: Self-pay

## 2020-07-19 NOTE — Telephone Encounter (Signed)
LVM

## 2020-07-27 ENCOUNTER — Ambulatory Visit: Payer: BC Managed Care – PPO | Admitting: Psychology

## 2020-07-29 ENCOUNTER — Ambulatory Visit (INDEPENDENT_AMBULATORY_CARE_PROVIDER_SITE_OTHER): Payer: BC Managed Care – PPO | Admitting: Psychology

## 2020-07-29 DIAGNOSIS — F321 Major depressive disorder, single episode, moderate: Secondary | ICD-10-CM | POA: Diagnosis not present

## 2020-08-02 ENCOUNTER — Encounter: Payer: BC Managed Care – PPO | Admitting: Gastroenterology

## 2020-08-03 ENCOUNTER — Encounter: Payer: Self-pay | Admitting: Gastroenterology

## 2020-08-04 ENCOUNTER — Telehealth: Payer: Self-pay | Admitting: *Deleted

## 2020-08-05 ENCOUNTER — Other Ambulatory Visit: Payer: Self-pay

## 2020-08-05 ENCOUNTER — Encounter: Payer: Self-pay | Admitting: Sports Medicine

## 2020-08-05 ENCOUNTER — Ambulatory Visit: Payer: BC Managed Care – PPO | Admitting: Sports Medicine

## 2020-08-05 DIAGNOSIS — R5382 Chronic fatigue, unspecified: Secondary | ICD-10-CM | POA: Insufficient documentation

## 2020-08-05 DIAGNOSIS — I10 Essential (primary) hypertension: Secondary | ICD-10-CM | POA: Insufficient documentation

## 2020-08-05 DIAGNOSIS — I341 Nonrheumatic mitral (valve) prolapse: Secondary | ICD-10-CM | POA: Insufficient documentation

## 2020-08-05 DIAGNOSIS — F320 Major depressive disorder, single episode, mild: Secondary | ICD-10-CM | POA: Insufficient documentation

## 2020-08-05 DIAGNOSIS — B351 Tinea unguium: Secondary | ICD-10-CM | POA: Diagnosis not present

## 2020-08-05 DIAGNOSIS — I519 Heart disease, unspecified: Secondary | ICD-10-CM | POA: Insufficient documentation

## 2020-08-05 DIAGNOSIS — K222 Esophageal obstruction: Secondary | ICD-10-CM | POA: Insufficient documentation

## 2020-08-05 DIAGNOSIS — M79675 Pain in left toe(s): Secondary | ICD-10-CM

## 2020-08-05 DIAGNOSIS — G9332 Myalgic encephalomyelitis/chronic fatigue syndrome: Secondary | ICD-10-CM | POA: Insufficient documentation

## 2020-08-05 DIAGNOSIS — M79674 Pain in right toe(s): Secondary | ICD-10-CM

## 2020-08-05 NOTE — Progress Notes (Signed)
Subjective: Marisa Hamilton is a 60 y.o. female patient seen today in office for nail check patient is status post 6 treatments of laser for toenail fungus at bilateral first toes.  Patient reports that her toenails are doing better.  Patient admits that it seems like her toenails are growing slowly with a little discoloration at the end but otherwise looks much better post treatment.  Patient Active Problem List   Diagnosis Date Noted  . Chronic fatigue syndrome 08/05/2020  . Essential hypertension 08/05/2020  . Heart disease 08/05/2020  . Mild major depression, single episode ([place]) 08/05/2020  . Schatzki's ring 08/05/2020  . Mitral valve prolapse 08/05/2020  . Class 1 obesity with serious comorbidity and body mass index (BMI) of 31.0 to 31.9 in adult 12/29/2019  . Hepatic cyst, left lobe, stable, [place] 2018 12/12/2019  . Emotional eating behaviors, followed by Dr. Mallie Mussel (Bariatric Psychology) 12/03/2019  . Other chronic pain 12/03/2019  . Polypharmacy 12/03/2019  . Prediabetes, highest A1c 5.7 on1/23/21, improved with diet changes 12/03/2019  . Vitamin D deficiency, AT GOAL, taking OTC vitamin D, 10/06/19 = 65.9 12/03/2019  . Moderate episode of recurrent major depressive disorder ([place]) 12/03/2019  . Attention deficit hyperactivity disorder (ADHD), predominantly inattentive type 12/03/2019  . Insomnia due to other mental disorder, followed by Psych, Rx Trazodone q hs 12/03/2019  . Hypertriglyceridemia 12/03/2019  . Fibromyalgia 12/03/2019  . Chronic constipation, treatment includes: fiber gummies, colace, miralax, Symproic 12/03/2019  . IC (interstitial cystitis) 12/03/2019  . Grade I diastolic dysfunction 40/34/7425  . Chronic migraine 12/03/2019  . Diverticulosis of colon 12/03/2019  . GERD (gastroesophageal reflux disease) 12/03/2019  . Tubular adenoma of colon, colonoscopy 2017, with repeat due 2022, [name] 12/03/2019  . Ulcerative colitis ([place]) 12/03/2019  . Low back pain  10/07/2019  . Pain in joint, ankle and foot 10/07/2019  . GAD (generalized anxiety disorder), Rx Zoloft 25 mg q hs, followed by Psych 03/02/2018  . PTSD (post-traumatic stress disorder), followed by Psych 03/02/2018  . Chronic venous insufficiency 10/28/2013  . Varicose veins of bilateral lower extremities with other complications 95/63/8756  . PLMD (periodic limb movement disorder), NPSG 2011 12/11/2009  . Other extrapyramidal disease and abnormal movement disorder 12/11/2009  . Allergic rhinitis 10/05/2009    Current Outpatient Medications on File Prior to Visit  Medication Sig Dispense Refill  . amoxicillin (AMOXIL) 875 MG tablet SMARTSIG:1 Tablet(s) By Mouth Every 12 Hours    . baclofen (LIORESAL) 10 MG tablet Take by mouth.    . chlorproMAZINE (THORAZINE) 25 MG tablet TAKE 1 TABLET BY MOUTH 3 TIMES DAILY AS NEEDED FOR HEADACHE RESCUE    . clonazePAM (KLONOPIN) 1 MG tablet Take 1 tablet (1 mg total) by mouth at bedtime. 90 tablet 5  . fluconazole (DIFLUCAN) 150 MG tablet TAKE 1 TABLET BY MOUTH ONCE. MAY REPEAT in 2 DAYS    . ketorolac (TORADOL) 60 MG/2ML SOLN injection Inject 60 mg into the muscle as needed (migraines).     [name] lamoTRIgine (LAMICTAL) 100 MG tablet Take 1 tablet (100 mg total) by mouth daily. 90 tablet 1  . Lasmiditan Succinate (REYVOW) 100 MG TABS Take 100 mg by mouth as needed.    . loratadine (CLARITIN) 10 MG tablet Take 10 mg by mouth daily.    [name] omeprazole (PRILOSEC) 40 MG capsule Take 1 capsule (40 mg total) by mouth daily. 90 capsule 3  . ondansetron (ZOFRAN) 8 MG tablet Take 8 mg by mouth as needed.    [name]  sertraline (ZOLOFT) 25 MG tablet Take 1.5 tablets (37.5 mg total) by mouth daily. As of 08/25/19 reduced from 50 mg daily 135 tablet 1  . tiZANidine (ZANAFLEX) 4 MG tablet Take 1 tablet by mouth as needed.    . topiramate (TOPAMAX) 50 MG tablet Take 1 tablet (50 mg total) by mouth 2 (two) times daily. (Patient taking differently: Take 50 mg by mouth daily.) 60  tablet 0  . traZODone (DESYREL) 100 MG tablet Take 1 tablet (100 mg total) by mouth at bedtime. 90 tablet 1  . Wheat Dextrin (BENEFIBER PO) Take by mouth.     No current facility-administered medications on file prior to visit.    Allergies  Allergen Reactions  . Nsaids Other (See Comments)    Other reaction(s): Abdominal Pain Ulcerative colitis  . Gluten Meal Other (See Comments)    Stomach upset  . Milk-Related Compounds Other (See Comments)    Stomach upset  . Phentermine     Pt stated, "Made my IC flare up"    Objective: Physical Exam  General: Well developed, nourished, no acute distress, awake, alert and oriented x 3  Vascular: Dorsalis pedis artery 2/4 bilateral, Posterior tibial artery 1/4 bilateral, skin temperature warm to warm proximal to distal bilateral lower extremities, no varicosities, pedal hair present bilateral.  Neurological: Gross sensation present via light touch bilateral.   Dermatological: Skin is warm, dry, and supple bilateral, proximal clearance noted at bilateral hallux nail folds with significant improvement in appearance of first toenails bilateral, no open lesions present bilateral, no callus/corns/hyperkeratotic tissue present bilateral. No signs of infection bilateral.  Musculoskeletal: Asymptomatic hallux rigidus/limitus boney deformities noted bilateral. Muscular strength within normal limits without painon range of motion. No pain with calf compression bilateral.  Assessment and Plan:  Problem List Items Addressed This Visit   None   Visit Diagnoses    Nail fungus    -  Primary   Relevant Medications   fluconazole (DIFLUCAN) 150 MG tablet   Toe pain, bilateral          -Examined patient.  -Discussed continued care for nails after completing laser -Mechanically debrided and reduced bilateral hallux nails using a sterile nail nipper without incident -Advised patient to continue to monitor her nails as they continue to grow out no acute  concerns or need for repeat laser at this time -Patient to return if fails to continue to improve or sooner if symptoms worsen.  Landis Martins, DPM

## 2020-08-05 NOTE — Telephone Encounter (Signed)
Chart entered in error

## 2020-08-10 ENCOUNTER — Ambulatory Visit: Payer: BC Managed Care – PPO

## 2020-08-10 ENCOUNTER — Ambulatory Visit (INDEPENDENT_AMBULATORY_CARE_PROVIDER_SITE_OTHER): Payer: BC Managed Care – PPO | Admitting: Psychology

## 2020-08-10 DIAGNOSIS — F321 Major depressive disorder, single episode, moderate: Secondary | ICD-10-CM

## 2020-08-19 ENCOUNTER — Encounter: Payer: Self-pay | Admitting: Psychiatry

## 2020-08-19 ENCOUNTER — Other Ambulatory Visit: Payer: Self-pay

## 2020-08-19 ENCOUNTER — Ambulatory Visit (INDEPENDENT_AMBULATORY_CARE_PROVIDER_SITE_OTHER): Payer: BC Managed Care – PPO | Admitting: Psychiatry

## 2020-08-19 DIAGNOSIS — F411 Generalized anxiety disorder: Secondary | ICD-10-CM | POA: Diagnosis not present

## 2020-08-19 DIAGNOSIS — F5105 Insomnia due to other mental disorder: Secondary | ICD-10-CM | POA: Diagnosis not present

## 2020-08-19 DIAGNOSIS — F3342 Major depressive disorder, recurrent, in full remission: Secondary | ICD-10-CM | POA: Diagnosis not present

## 2020-08-19 DIAGNOSIS — F99 Mental disorder, not otherwise specified: Secondary | ICD-10-CM | POA: Diagnosis not present

## 2020-08-19 MED ORDER — CLONAZEPAM 1 MG PO TABS: 1.0000 mg | ORAL_TABLET | Freq: Every day | ORAL | 1 refills | Status: DC

## 2020-08-19 MED ORDER — SERTRALINE HCL 25 MG PO TABS: 37.5000 mg | ORAL_TABLET | Freq: Every day | ORAL | 1 refills | Status: DC

## 2020-08-19 MED ORDER — TRAZODONE HCL 100 MG PO TABS: 100.0000 mg | ORAL_TABLET | Freq: Every day | ORAL | 1 refills | Status: DC

## 2020-08-19 MED ORDER — LAMOTRIGINE 100 MG PO TABS: 100.0000 mg | ORAL_TABLET | Freq: Every day | ORAL | 1 refills | Status: DC
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
## 2020-08-20 ENCOUNTER — Ambulatory Visit
Admission: RE | Admit: 2020-08-20 | Discharge: 2020-08-20 | Disposition: A | Discharge status: Home / Self Care | Payer: BC Managed Care – PPO | Source: Ambulatory Visit | Attending: Nurse Practitioner | Admitting: Nurse Practitioner

## 2020-08-20 DIAGNOSIS — N6320 Unspecified lump in the left breast, unspecified quadrant: Secondary | ICD-10-CM

## 2020-08-26 ENCOUNTER — Ambulatory Visit: Payer: BC Managed Care – PPO | Admitting: Psychology

## 2020-08-31 ENCOUNTER — Telehealth: Payer: Self-pay | Admitting: Psychiatry

## 2020-08-31 ENCOUNTER — Other Ambulatory Visit: Payer: Self-pay | Admitting: Psychiatry

## 2020-08-31 DIAGNOSIS — F331 Major depressive disorder, recurrent, moderate: Secondary | ICD-10-CM

## 2020-08-31 DIAGNOSIS — F900 Attention-deficit hyperactivity disorder, predominantly inattentive type: Secondary | ICD-10-CM

## 2020-08-31 MED ORDER — MODAFINIL 100 MG PO TABS: 100.0000 mg | ORAL_TABLET | Freq: Every day | ORAL | 0 refills | Status: DC

## 2020-08-31 NOTE — Telephone Encounter (Signed)
I don't see this on her med list but PMP shows it was last sent 10/16/19.Appt on 02/17/21

## 2020-08-31 NOTE — Telephone Encounter (Signed)
Pt called requesting a new Rx for Modafinil 100 mg @ [facility].

## 2020-09-01 ENCOUNTER — Ambulatory Visit (INDEPENDENT_AMBULATORY_CARE_PROVIDER_SITE_OTHER): Payer: BC Managed Care – PPO | Admitting: Psychology

## 2020-09-01 DIAGNOSIS — F321 Major depressive disorder, single episode, moderate: Secondary | ICD-10-CM | POA: Diagnosis not present

## 2020-09-08 ENCOUNTER — Encounter: Payer: BC Managed Care – PPO | Admitting: Gastroenterology

## 2020-09-09 ENCOUNTER — Ambulatory Visit: Payer: BC Managed Care – PPO | Admitting: Psychology

## 2020-09-13 ENCOUNTER — Ambulatory Visit (INDEPENDENT_AMBULATORY_CARE_PROVIDER_SITE_OTHER): Payer: BC Managed Care – PPO | Admitting: Psychology

## 2020-09-13 DIAGNOSIS — F321 Major depressive disorder, single episode, moderate: Secondary | ICD-10-CM

## 2020-09-23 ENCOUNTER — Ambulatory Visit: Payer: BC Managed Care – PPO | Admitting: Psychology

## 2020-10-04 ENCOUNTER — Encounter: Payer: BC Managed Care – PPO | Admitting: Gastroenterology

## 2020-10-07 ENCOUNTER — Ambulatory Visit (INDEPENDENT_AMBULATORY_CARE_PROVIDER_SITE_OTHER): Payer: BC Managed Care – PPO | Admitting: Psychology

## 2020-10-07 DIAGNOSIS — F321 Major depressive disorder, single episode, moderate: Secondary | ICD-10-CM

## 2020-10-21 ENCOUNTER — Ambulatory Visit (INDEPENDENT_AMBULATORY_CARE_PROVIDER_SITE_OTHER): Payer: BC Managed Care – PPO | Admitting: Psychology

## 2020-10-21 ENCOUNTER — Telehealth: Payer: Self-pay | Admitting: Psychiatry

## 2020-10-21 ENCOUNTER — Other Ambulatory Visit: Payer: Self-pay | Admitting: Psychiatry

## 2020-10-21 DIAGNOSIS — F321 Major depressive disorder, single episode, moderate: Secondary | ICD-10-CM

## 2020-10-21 DIAGNOSIS — F99 Mental disorder, not otherwise specified: Secondary | ICD-10-CM

## 2020-10-21 DIAGNOSIS — F5105 Insomnia due to other mental disorder: Secondary | ICD-10-CM

## 2020-10-21 MED ORDER — TRAZODONE HCL 100 MG PO TABS: 300.0000 mg | ORAL_TABLET | Freq: Every day | ORAL | 0 refills | Status: DC

## 2020-10-21 NOTE — Telephone Encounter (Signed)
Please review

## 2020-10-21 NOTE — Telephone Encounter (Signed)
Patient lm stating during the last visit it was recommended if not sleeping well the Trazodone could be increased. A refill with the modified dosing instructions is needed.

## 2020-10-21 NOTE — Telephone Encounter (Signed)
She can increase trazodone to 3 of the 100 mg tablets at night.  Prescription sent

## 2020-10-22 NOTE — Telephone Encounter (Signed)
Pt informed

## 2020-11-18 ENCOUNTER — Ambulatory Visit: Payer: BC Managed Care – PPO | Admitting: Psychology

## 2020-12-20 ENCOUNTER — Ambulatory Visit
Admission: RE | Admit: 2020-12-20 | Discharge: 2020-12-20 | Disposition: A | Discharge status: Home / Self Care | Payer: BC Managed Care – PPO | Source: Ambulatory Visit | Attending: Family Medicine | Admitting: Family Medicine

## 2020-12-20 ENCOUNTER — Other Ambulatory Visit: Payer: Self-pay

## 2020-12-20 ENCOUNTER — Other Ambulatory Visit: Payer: Self-pay | Admitting: Family Medicine

## 2020-12-20 DIAGNOSIS — R52 Pain, unspecified: Secondary | ICD-10-CM

## 2021-02-04 ENCOUNTER — Other Ambulatory Visit: Payer: Self-pay

## 2021-02-04 DIAGNOSIS — F5105 Insomnia due to other mental disorder: Secondary | ICD-10-CM

## 2021-02-04 MED ORDER — TRAZODONE HCL 100 MG PO TABS: 300.0000 mg | ORAL_TABLET | Freq: Every day | ORAL | 0 refills | Status: DC

## 2021-02-08 ENCOUNTER — Other Ambulatory Visit: Payer: Self-pay

## 2021-02-08 ENCOUNTER — Other Ambulatory Visit: Payer: Self-pay | Admitting: Nurse Practitioner

## 2021-02-08 ENCOUNTER — Ambulatory Visit (INDEPENDENT_AMBULATORY_CARE_PROVIDER_SITE_OTHER): Payer: BC Managed Care – PPO

## 2021-02-08 DIAGNOSIS — M8589 Other specified disorders of bone density and structure, multiple sites: Secondary | ICD-10-CM

## 2021-02-08 DIAGNOSIS — Z780 Asymptomatic menopausal state: Secondary | ICD-10-CM

## 2021-02-08 DIAGNOSIS — M85851 Other specified disorders of bone density and structure, right thigh: Secondary | ICD-10-CM

## 2021-02-17 ENCOUNTER — Encounter: Payer: Self-pay | Admitting: Psychiatry

## 2021-02-17 ENCOUNTER — Ambulatory Visit: Payer: BC Managed Care – PPO | Admitting: Psychiatry

## 2021-02-17 ENCOUNTER — Other Ambulatory Visit: Payer: Self-pay

## 2021-02-17 DIAGNOSIS — F99 Mental disorder, not otherwise specified: Secondary | ICD-10-CM

## 2021-02-17 DIAGNOSIS — F900 Attention-deficit hyperactivity disorder, predominantly inattentive type: Secondary | ICD-10-CM

## 2021-02-17 DIAGNOSIS — F3342 Major depressive disorder, recurrent, in full remission: Secondary | ICD-10-CM

## 2021-02-17 DIAGNOSIS — F411 Generalized anxiety disorder: Secondary | ICD-10-CM

## 2021-02-17 DIAGNOSIS — F5105 Insomnia due to other mental disorder: Secondary | ICD-10-CM | POA: Diagnosis not present

## 2021-02-17 DIAGNOSIS — F515 Nightmare disorder: Secondary | ICD-10-CM

## 2021-02-17 DIAGNOSIS — F331 Major depressive disorder, recurrent, moderate: Secondary | ICD-10-CM

## 2021-02-17 MED ORDER — DOXAZOSIN MESYLATE 2 MG PO TABS: ORAL_TABLET | ORAL | 0 refills | Status: DC

## 2021-02-17 MED ORDER — CLONIDINE HCL 0.1 MG PO TABS: ORAL_TABLET | ORAL | 0 refills | Status: DC

## 2021-02-17 MED ORDER — LAMOTRIGINE 100 MG PO TABS: 100.0000 mg | ORAL_TABLET | Freq: Every day | ORAL | 1 refills | Status: DC

## 2021-02-17 MED ORDER — SERTRALINE HCL 25 MG PO TABS: 37.5000 mg | ORAL_TABLET | Freq: Every day | ORAL | 1 refills | Status: DC

## 2021-02-17 MED ORDER — CLONAZEPAM 1 MG PO TABS: 1.0000 mg | ORAL_TABLET | Freq: Every day | ORAL | 1 refills | Status: DC

## 2021-02-17 MED ORDER — MODAFINIL 100 MG PO TABS: 100.0000 mg | ORAL_TABLET | Freq: Every day | ORAL | 0 refills | Status: DC
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
## 2021-04-27 ENCOUNTER — Ambulatory Visit: Payer: BC Managed Care – PPO | Admitting: Psychiatry

## 2021-05-17 DIAGNOSIS — G43709 Chronic migraine without aura, not intractable, without status migrainosus: Secondary | ICD-10-CM | POA: Diagnosis not present

## 2021-05-18 DIAGNOSIS — Z299 Encounter for prophylactic measures, unspecified: Secondary | ICD-10-CM | POA: Diagnosis not present

## 2021-05-18 DIAGNOSIS — J4520 Mild intermittent asthma, uncomplicated: Secondary | ICD-10-CM | POA: Diagnosis not present

## 2021-05-18 DIAGNOSIS — J209 Acute bronchitis, unspecified: Secondary | ICD-10-CM | POA: Diagnosis not present

## 2021-05-18 DIAGNOSIS — J019 Acute sinusitis, unspecified: Secondary | ICD-10-CM | POA: Diagnosis not present

## 2021-05-23 DIAGNOSIS — R0602 Shortness of breath: Secondary | ICD-10-CM | POA: Diagnosis not present

## 2021-05-23 DIAGNOSIS — J0100 Acute maxillary sinusitis, unspecified: Secondary | ICD-10-CM | POA: Diagnosis not present

## 2021-05-23 DIAGNOSIS — U071 COVID-19: Secondary | ICD-10-CM | POA: Diagnosis not present

## 2021-05-26 ENCOUNTER — Other Ambulatory Visit: Payer: Self-pay

## 2021-05-26 ENCOUNTER — Emergency Department (HOSPITAL_BASED_OUTPATIENT_CLINIC_OR_DEPARTMENT_OTHER)
Admission: EM | Admit: 2021-05-26 | Discharge: 2021-05-27 | Disposition: A | Discharge status: Home / Self Care | Payer: BC Managed Care – PPO | Attending: Emergency Medicine | Admitting: Emergency Medicine

## 2021-05-26 ENCOUNTER — Encounter (HOSPITAL_BASED_OUTPATIENT_CLINIC_OR_DEPARTMENT_OTHER): Payer: Self-pay | Admitting: Emergency Medicine

## 2021-05-26 DIAGNOSIS — R0789 Other chest pain: Secondary | ICD-10-CM | POA: Diagnosis not present

## 2021-05-26 DIAGNOSIS — R109 Unspecified abdominal pain: Secondary | ICD-10-CM | POA: Diagnosis not present

## 2021-05-26 DIAGNOSIS — R079 Chest pain, unspecified: Secondary | ICD-10-CM | POA: Insufficient documentation

## 2021-05-26 DIAGNOSIS — K209 Esophagitis, unspecified without bleeding: Secondary | ICD-10-CM | POA: Diagnosis not present

## 2021-05-26 DIAGNOSIS — R0602 Shortness of breath: Secondary | ICD-10-CM | POA: Diagnosis not present

## 2021-05-26 NOTE — ED Triage Notes (Addendum)
°  Patient comes in with throat pain and abdominal pain that started earlier tonight about 2030.  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  States this has happened 2-3x since October but this one is the worst.  Patient tried to eat dinner and threw it up.  Pain 10/10, pressure/burning from throat to belly button.

## 2021-05-27 ENCOUNTER — Other Ambulatory Visit: Payer: Self-pay

## 2021-05-27 ENCOUNTER — Emergency Department (HOSPITAL_BASED_OUTPATIENT_CLINIC_OR_DEPARTMENT_OTHER): Payer: BC Managed Care – PPO

## 2021-05-27 ENCOUNTER — Telehealth: Payer: Self-pay | Admitting: Gastroenterology

## 2021-05-27 ENCOUNTER — Emergency Department (HOSPITAL_BASED_OUTPATIENT_CLINIC_OR_DEPARTMENT_OTHER): Payer: BC Managed Care – PPO | Admitting: Radiology

## 2021-05-27 ENCOUNTER — Other Ambulatory Visit (HOSPITAL_BASED_OUTPATIENT_CLINIC_OR_DEPARTMENT_OTHER): Payer: Self-pay | Admitting: Radiology

## 2021-05-27 DIAGNOSIS — K5730 Diverticulosis of large intestine without perforation or abscess without bleeding: Secondary | ICD-10-CM | POA: Diagnosis not present

## 2021-05-27 DIAGNOSIS — R140 Abdominal distension (gaseous): Secondary | ICD-10-CM | POA: Diagnosis not present

## 2021-05-27 DIAGNOSIS — N3289 Other specified disorders of bladder: Secondary | ICD-10-CM | POA: Diagnosis not present

## 2021-05-27 DIAGNOSIS — K2289 Other specified disease of esophagus: Secondary | ICD-10-CM | POA: Diagnosis not present

## 2021-05-27 DIAGNOSIS — K5900 Constipation, unspecified: Secondary | ICD-10-CM | POA: Diagnosis not present

## 2021-05-27 DIAGNOSIS — R079 Chest pain, unspecified: Secondary | ICD-10-CM | POA: Diagnosis not present

## 2021-05-27 DIAGNOSIS — K7689 Other specified diseases of liver: Secondary | ICD-10-CM | POA: Diagnosis not present

## 2021-05-27 DIAGNOSIS — R918 Other nonspecific abnormal finding of lung field: Secondary | ICD-10-CM | POA: Diagnosis not present

## 2021-05-27 DIAGNOSIS — J9811 Atelectasis: Secondary | ICD-10-CM | POA: Diagnosis not present

## 2021-05-27 LAB — COMPREHENSIVE METABOLIC PANEL
ALT: 13 U/L (ref 0–44)
AST: 14 U/L — ABNORMAL LOW (ref 15–41)
Albumin: 3.8 g/dL (ref 3.5–5.0)
Alkaline Phosphatase: 61 U/L (ref 38–126)
Anion gap: 8 (ref 5–15)
BUN: 18 mg/dL (ref 8–23)
CO2: 26 mmol/L (ref 22–32)
Calcium: 8.8 mg/dL — ABNORMAL LOW (ref 8.9–10.3)
Chloride: 103 mmol/L (ref 98–111)
Creatinine, Ser: 1.04 mg/dL — ABNORMAL HIGH (ref 0.44–1.00)
GFR, Estimated: >60 mL/min (ref >60)
Glucose, Bld: 101 mg/dL — ABNORMAL HIGH (ref 70–99)
Potassium: 3.7 mmol/L (ref 3.5–5.1)
Sodium: 137 mmol/L (ref 135–145)
Total Bilirubin: 0.4 mg/dL (ref 0.3–1.2)
Total Protein: 6.7 g/dL (ref 6.5–8.1)

## 2021-05-27 LAB — TROPONIN I (HIGH SENSITIVITY)
Troponin I (High Sensitivity): 2 ng/L (ref <18)
Troponin I (High Sensitivity): 2 ng/L (ref <18)

## 2021-05-27 LAB — CBC
HCT: 42.3 % (ref 36.0–46.0)
Hemoglobin: 13.9 g/dL (ref 12.0–15.0)
MCH: 28.8 pg (ref 26.0–34.0)
MCHC: 32.9 g/dL (ref 30.0–36.0)
MCV: 87.6 fL (ref 80.0–100.0)
Platelets: 278 10*3/uL (ref 150–400)
RBC: 4.83 MIL/uL (ref 3.87–5.11)
RDW: 13.3 % (ref 11.5–15.5)
WBC: 12.3 10*3/uL — ABNORMAL HIGH (ref 4.0–10.5)
nRBC: 0 % (ref 0.0–0.2)

## 2021-05-27 LAB — LIPASE, BLOOD: Lipase: 24 U/L (ref 11–51)

## 2021-05-27 MED ORDER — FAMOTIDINE IN NACL 20-0.9 MG/50ML-% IV SOLN
20.0000 mg | Freq: Once | INTRAVENOUS | Status: AC
  Administered 2021-05-27: 20 mg via INTRAVENOUS
  Filled 2021-05-27: qty 50

## 2021-05-27 MED ORDER — FENTANYL CITRATE PF 50 MCG/ML IJ SOSY
50.0000 ug | PREFILLED_SYRINGE | Freq: Once | INTRAMUSCULAR | Status: AC
  Administered 2021-05-27: 50 ug via INTRAVENOUS
  Filled 2021-05-27: qty 1

## 2021-05-27 MED ORDER — ALUM & MAG HYDROXIDE-SIMETH 200-200-20 MG/5ML PO SUSP
30.0000 mL | Freq: Once | ORAL | Status: AC
  Administered 2021-05-27: 30 mL via ORAL
  Filled 2021-05-27: qty 30

## 2021-05-27 MED ORDER — SUCRALFATE 1 G PO TABS: 1.0000 g | ORAL_TABLET | Freq: Four times a day (QID) | ORAL | 0 refills | Status: DC | PRN

## 2021-05-27 MED ORDER — SUCRALFATE 1 GM/10ML PO SUSP: 1.0000 g | Freq: Three times a day (TID) | ORAL | 0 refills | Status: DC

## 2021-05-27 MED ORDER — LIDOCAINE VISCOUS HCL 2 % MT SOLN: OROMUCOSAL | 0 refills | Status: DC

## 2021-05-27 MED ORDER — PANTOPRAZOLE SODIUM 40 MG IV SOLR
40.0000 mg | Freq: Once | INTRAVENOUS | Status: AC
  Administered 2021-05-27: 40 mg via INTRAVENOUS
  Filled 2021-05-27: qty 40

## 2021-05-27 MED ORDER — FENTANYL CITRATE PF 50 MCG/ML IJ SOSY
25.0000 ug | PREFILLED_SYRINGE | Freq: Once | INTRAMUSCULAR | Status: AC
  Administered 2021-05-27: 25 ug via INTRAVENOUS
  Filled 2021-05-27: qty 1

## 2021-05-27 MED ORDER — SODIUM CHLORIDE 0.9 % IV BOLUS
1000.0000 mL | Freq: Once | INTRAVENOUS | Status: AC
  Administered 2021-05-27: 1000 mL via INTRAVENOUS

## 2021-05-27 MED ORDER — IOHEXOL 300 MG/ML  SOLN
100.0000 mL | Freq: Once | INTRAMUSCULAR | Status: AC | PRN
  Administered 2021-05-27: 100 mL via INTRAVENOUS

## 2021-05-27 NOTE — Telephone Encounter (Signed)
Patient agrees to this plan of care. Rx's to [facility].

## 2021-05-27 NOTE — Telephone Encounter (Signed)
Please send Rx for Magic mouthwash with viscous lidocaine to use 5 cc as needed up to 4 times daily for severe burning sensation and throat discomfort. Send refill for Carafate to use 1 g before every meal and at bedtime as needed X 30 days, she may benefit to use it as a slurry or suspension by letting the tablets dissolve in small amount of water Continue pantoprazole daily Follow-up in office visit as scheduled

## 2021-05-27 NOTE — Telephone Encounter (Signed)
This patient went to the ER yesterday with esophagitis.  She said she is in terrible shape and needs an appointment with Dr. Silverio Decamp as soon as she can get one.  As of now, there are no follow-up appointments available.  Can you please call patient and advise?  Thank you.

## 2021-05-27 NOTE — Discharge Instructions (Signed)
You were evaluated in the Emergency Department and after careful evaluation, we did not find any emergent condition requiring admission or further testing in the hospital.  Your exam/testing today was overall reassuring.  Symptoms seem to be due to reflux esophagitis.  Continue your home omeprazole and use the Carafate prescribed as needed for any additional discomfort.  Stop taking the doxycycline as we discussed.  Avoid acidic or spicy food.  Follow-up closely with your GI doctor.  Please return to the Emergency Department if you experience any worsening of your condition.  Thank you for allowing [place] to be a part of your care.

## 2021-05-27 NOTE — Telephone Encounter (Signed)
Spoke with the patient. She expresses deep concern with her symptoms and states it is very painful to eat. "I am not eating because it hurts my throat and my stomach." She had been on Doxycycline for a sinus infection. The sx's of choking and the chest burning "came on so quickly" that she went to the ED. Cardiac issues were r/o. CT was without acute findings. Reports are in [place]. She is taking Carafate tablets QID and admits this has helped. She has 7 days of Carafate tablets. She takes Omeprazole 40 mg daily. We discussed diet encouraging her to eat small soft bland meals, reflux precautions and correct way to take her medication. She is scheduled with Tye Savoy 06/01/21.  Is there anything you advise?

## 2021-05-27 NOTE — ED Provider Notes (Signed)
[facility] Emergency Department Provider Note MRN:  [id number]  Arrival date & time: 05/27/21     Chief Complaint   Shortness of Breath and Abdominal Pain   History of Present Illness   Marisa Hamilton is a 61 y.o. year-old female with a history of Schatzki's ring presenting to the ED with chief complaint of shortness of breath and abdominal pain.  Shortly prior to arrival patient was eating a meal and experienced a sudden inability to pass the food through her throat.  This is happened before and was related to her Schatzki's ring.  This caused her to vomit up all of the food.  She then began experiencing moderate to severe chest and abdominal pain, persistent and progressively worsening.  Described as a pressure/burning.  This is never happened before.  She denies fever, no further vomiting or diarrhea.  Review of Systems  A thorough review of systems was obtained and all systems are negative except as noted in the HPI and PMH.   Patient's Health History    Past Medical History:  Diagnosis Date   Anxiety    Back pain    CFS (chronic fatigue syndrome)    Concussion 03-17-17   Constipation    Cystitis, interstitial    HAD BLADDER SX FOR SAME   Depression    Fainting spell    Fibromyalgia    Food allergy    Foot pain    GERD (gastroesophageal reflux disease)    Headache(784.0)    IBS (irritable bowel syndrome)    TAKES PROBIOTICS   Insomnia    Lactose intolerance    Leg pain    Migraine    MVP (mitral valve prolapse)    Nausea    Neck pain    PONV (postoperative nausea and vomiting)    Restless leg syndrome    Stomach ache    Ulcerative colitis ([place])    Varicose veins     Past Surgical History:  Procedure Laterality Date   ABDOMINOPLASTY  4/14   BLADDER SURGERY     FOR IC   BREAST BIOPSY Left 02-26-12   negative   BREAST REDUCTION SURGERY  10/10/2011   Procedure: MAMMARY REDUCTION  (BREAST);  Surgeon: Charlene Brooke, MD;   Location: [place];  Service: Plastics;  Laterality: Bilateral;   COLONOSCOPY  10/22/2015   Dr.Mann   LASIK     NASAL SEPTUM SURGERY     REDUCTION MAMMAPLASTY Bilateral    VASCULAR SURGERY     VARICOSE VEINS    Family History  Problem Relation Age of Onset   Osteoporosis Mother    Atrial fibrillation Mother    Heart disease Mother    Stroke Mother    Depression Mother    Anxiety disorder Mother    Obesity Mother    Diabetes Father    Hypertension Father    Depression Father    Obesity Father    Breast cancer Maternal Grandmother    Cancer Maternal Grandmother        ovarian   Diabetes Paternal Grandmother    Migraines Brother        CHRONIC   Colon cancer Neg Hx    Esophageal cancer Neg Hx    Pancreatic cancer Neg Hx    Stomach cancer Neg Hx    Liver disease Neg Hx    Rectal cancer Neg Hx    Colon polyps Neg Hx     Social History   Socioeconomic  History   Marital status: Married    Spouse name: Chip Bellizzi   Number of children: Not on file   Years of education: Not on file   Highest education level: Not on file  Occupational History   Occupation: Retired Wellsite geologist  Tobacco Use   Smoking status: Never   Smokeless tobacco: Never  Vaping Use   Vaping Use: Never used  Substance and Sexual Activity   Alcohol use: Not Currently   Drug use: No   Sexual activity: Yes    Partners: Male    Birth control/protection: Other-see comments, Post-menopausal    Comment: husband vasectomy  Other Topics Concern   Not on file  Social History Narrative   Not on file   Social Determinants of Health   Financial Resource Strain: Not on file  Food Insecurity: Not on file  Transportation Needs: Not on file  Physical Activity: Not on file  Stress: Not on file  Social Connections: Not on file  Intimate Partner Violence: Not on file     Physical Exam   Vitals:   05/27/21 0330 05/27/21 0400  BP: (!) 106/53 107/68  Pulse: 63 79  Resp: (!) 9 15   Temp:    SpO2: 99% 95%    CONSTITUTIONAL: Well-appearing, NAD NEURO/PSYCH:  Alert and oriented x 3, no focal deficits EYES:  eyes equal and reactive ENT/NECK:  no LAD, no JVD CARDIO: Regular rate, well-perfused, normal S1 and S2 PULM:  CTAB no wheezing or rhonchi GI/GU:  non-distended, non-tender MSK/SPINE:  No gross deformities, no edema SKIN:  no rash, atraumatic   *Additional and/or pertinent findings included in MDM below  Diagnostic and Interventional Summary    EKG Interpretation  Date/Time:  Thursday May 26 2021 23:31:51 EST Ventricular Rate:  72 PR Interval:  162 QRS Duration: 80 QT Interval:  378 QTC Calculation: 413 R Axis:   32 Text Interpretation: Normal sinus rhythm Nonspecific ST and T wave abnormality Abnormal ECG No previous ECGs available Confirmed by Gerlene Fee [phone]) on 05/27/2021 12:47:40 AM       Labs Reviewed  CBC - Abnormal; Notable for the following components:      Result Value   WBC 12.3 (*)    All other components within normal limits  COMPREHENSIVE METABOLIC PANEL - Abnormal; Notable for the following components:   Glucose, Bld 101 (*)    Creatinine, Ser 1.04 (*)    Calcium 8.8 (*)    AST 14 (*)    All other components within normal limits  LIPASE, BLOOD  TROPONIN I (HIGH SENSITIVITY)  TROPONIN I (HIGH SENSITIVITY)    CT CHEST ABDOMEN PELVIS W CONTRAST  Final Result    DG Abdomen Acute W/Chest  Final Result      Medications  sodium chloride 0.9 % bolus 1,000 mL (0 mLs Intravenous Stopped 05/27/21 0239)  famotidine (PEPCID) IVPB 20 mg premix (0 mg Intravenous Stopped 05/27/21 0151)  fentaNYL (SUBLIMAZE) injection 25 mcg (25 mcg Intravenous Given 05/27/21 0116)  iohexol (OMNIPAQUE) 300 MG/ML solution 100 mL (100 mLs Intravenous Contrast Given 05/27/21 0228)  pantoprazole (PROTONIX) injection 40 mg (40 mg Intravenous Given 05/27/21 0333)  alum & mag hydroxide-simeth (MAALOX/MYLANTA) 200-200-20 MG/5ML suspension 30 mL (30 mLs  Oral Given 05/27/21 0332)  fentaNYL (SUBLIMAZE) injection 50 mcg (50 mcg Intravenous Given 05/27/21 0335)     Procedures  /  Critical Care Procedures  ED Course and Medical Decision Making  Initial Impression and Ddx Considering esophageal rupture is the worst  case scenario due to increased pressure during swallowing due to Schatzki's ring.  Could also be due to esophageal spasm, GERD, less likely ACS, highly doubt PE.  Awaiting labs, CT imaging.  Past medical/surgical history that increases complexity of ED encounter: Schatzki's ring  Interpretation of Diagnostics I personally reviewed the EKG and my interpretation is as follows: Sinus rhythm, no significant changes from prior    Labs are reassuring with negative lipase, no electrolyte disturbance, troponin negative x2  Patient Reassessment and Ultimate Disposition/Management Patient is feeling better, CT scan with evidence of reflux esophagitis, patient could have been experiencing esophageal spasm as well.  She is currently on the last few days of doxycycline for sinus issues, doxycycline could be contributing to esophagitis as well.  Appropriate for discharge with GI follow-up.  Patient management required discussion with the following services or consulting groups:  None  Complexity of Problems Addressed Chronic illness with exacerbation  Additional Data Reviewed and Analyzed Further history obtained from: Recent Consult notes  Patient Encounter Risk Assessment Prescriptions  Barth Kirks. Sedonia Small, [place] [email or web address]  Final Clinical Impressions(s) / ED Diagnoses     ICD-10-CM   1. Esophagitis  K20.90       ED Discharge Orders          Ordered    sucralfate (CARAFATE) 1 g tablet  4 times daily PRN        05/27/21 0443             Discharge Instructions Discussed with and Provided to Patient:     Discharge Instructions      You were evaluated in  the Emergency Department and after careful evaluation, we did not find any emergent condition requiring admission or further testing in the hospital.  Your exam/testing today was overall reassuring.  Symptoms seem to be due to reflux esophagitis.  Continue your home omeprazole and use the Carafate prescribed as needed for any additional discomfort.  Stop taking the doxycycline as we discussed.  Avoid acidic or spicy food.  Follow-up closely with your GI doctor.  Please return to the Emergency Department if you experience any worsening of your condition.  Thank you for allowing [place] to be a part of your care.        Maudie Flakes, MD 05/27/21 [phone]

## 2021-06-01 ENCOUNTER — Ambulatory Visit: Payer: BC Managed Care – PPO | Admitting: Nurse Practitioner

## 2021-06-01 ENCOUNTER — Other Ambulatory Visit: Payer: Self-pay | Admitting: Gastroenterology

## 2021-06-01 ENCOUNTER — Encounter: Payer: Self-pay | Admitting: Nurse Practitioner

## 2021-06-01 VITALS — BP 110/68 | HR 73 | Ht 65.0 in | Wt 194.0 lb

## 2021-06-01 DIAGNOSIS — R195 Other fecal abnormalities: Secondary | ICD-10-CM | POA: Diagnosis not present

## 2021-06-01 DIAGNOSIS — R1310 Dysphagia, unspecified: Secondary | ICD-10-CM | POA: Diagnosis not present

## 2021-06-01 DIAGNOSIS — R9389 Abnormal findings on diagnostic imaging of other specified body structures: Secondary | ICD-10-CM

## 2021-06-01 NOTE — Progress Notes (Deleted)
Established Patient Office Visit  Subjective:  Patient ID: Marisa Hamilton, female    DOB: [birth year]  Age: 61 y.o. MRN: [id number]  CC:  Chief Complaint  Patient presents with   Esophagitis    Patient has ongoing symptoms of burping, feeling of fullness, also has diarrhea due to regimen of antibiotics    HPI Marisa Hamilton presents for ***  Past Medical History:  Diagnosis Date   Anxiety    Back pain    CFS (chronic fatigue syndrome)    Concussion 03-17-17   Constipation    Cystitis, interstitial    HAD BLADDER SX FOR SAME   Depression    Fainting spell    Fibromyalgia    Food allergy    Foot pain    GERD (gastroesophageal reflux disease)    Headache(784.0)    IBS (irritable bowel syndrome)    TAKES PROBIOTICS   Insomnia    Lactose intolerance    Leg pain    Migraine    MVP (mitral valve prolapse)    Nausea    Neck pain    PONV (postoperative nausea and vomiting)    Restless leg syndrome    Stomach ache    Ulcerative colitis ([place])    Varicose veins     Past Surgical History:  Procedure Laterality Date   ABDOMINOPLASTY  4/14   BLADDER SURGERY     FOR IC   BREAST BIOPSY Left 02-26-12   negative   BREAST REDUCTION SURGERY  10/10/2011   Procedure: MAMMARY REDUCTION  (BREAST);  Surgeon: Charlene Brooke, MD;  Location: [facility];  Service: Plastics;  Laterality: Bilateral;   COLONOSCOPY  10/22/2015   Dr.Mann   LASIK     NASAL SEPTUM SURGERY     REDUCTION MAMMAPLASTY Bilateral    VASCULAR SURGERY     VARICOSE VEINS    Family History  Problem Relation Age of Onset   Osteoporosis Mother    Atrial fibrillation Mother    Heart disease Mother    Stroke Mother    Depression Mother    Anxiety disorder Mother    Obesity Mother    Diabetes Father    Hypertension Father    Depression Father    Obesity Father    Breast cancer Maternal Grandmother    Cancer Maternal Grandmother        ovarian   Diabetes Paternal Grandmother     Migraines Brother        CHRONIC   Colon cancer Neg Hx    Esophageal cancer Neg Hx    Pancreatic cancer Neg Hx    Stomach cancer Neg Hx    Liver disease Neg Hx    Rectal cancer Neg Hx    Colon polyps Neg Hx     Social History   Socioeconomic History   Marital status: Married    Spouse name: Marisa Hamilton   Number of children: Not on file   Years of education: Not on file   Highest education level: Not on file  Occupational History   Occupation: Retired Wellsite geologist  Tobacco Use   Smoking status: Never   Smokeless tobacco: Never  Vaping Use   Vaping Use: Never used  Substance and Sexual Activity   Alcohol use: Not Currently   Drug use: No   Sexual activity: Yes    Partners: Male    Birth control/protection: Other-see comments, Post-menopausal    Comment: husband vasectomy  Other Topics Concern  Not on file  Social History Narrative   Not on file   Social Determinants of Health   Financial Resource Strain: Not on file  Food Insecurity: Not on file  Transportation Needs: Not on file  Physical Activity: Not on file  Stress: Not on file  Social Connections: Not on file  Intimate Partner Violence: Not on file    Outpatient Medications Prior to Visit  Medication Sig Dispense Refill   amoxicillin (AMOXIL) 875 MG tablet SMARTSIG:1 Tablet(s) By Mouth Every 12 Hours     baclofen (LIORESAL) 10 MG tablet Take by mouth.     chlorproMAZINE (THORAZINE) 25 MG tablet TAKE 1 TABLET BY MOUTH 3 TIMES DAILY AS NEEDED FOR HEADACHE RESCUE     clonazePAM (KLONOPIN) 1 MG tablet Take 1 tablet (1 mg total) by mouth at bedtime. 90 tablet 1   cloNIDine (CATAPRES) 0.1 MG tablet 1 daily prn anxiety 15 tablet 0   ketorolac (TORADOL) 60 MG/2ML SOLN injection Inject 60 mg into the muscle as needed (migraines).      lamoTRIgine (LAMICTAL) 100 MG tablet Take 1 tablet (100 mg total) by mouth daily. 90 tablet 1   Lasmiditan Succinate (REYVOW) 100 MG TABS Take 100 mg by mouth as needed.      loratadine (CLARITIN) 10 MG tablet Take 10 mg by mouth daily.     magic mouthwash (lidocaine, diphenhydrAMINE, alum & mag hydroxide) suspension 1 part each of viscous lidocaine 2% Maalox and benadryl 12.5 mg / 5 ml Gargle and swallow 5 ml 4 times daily PRN 300 mL 0   modafinil (PROVIGIL) 100 MG tablet Take 1 tablet (100 mg total) by mouth daily. 30 tablet 0   omeprazole (PRILOSEC) 40 MG capsule Take 1 capsule (40 mg total) by mouth daily. 90 capsule 3   ondansetron (ZOFRAN) 8 MG tablet Take 8 mg by mouth as needed.     sertraline (ZOLOFT) 25 MG tablet Take 1.5 tablets (37.5 mg total) by mouth daily. As of 08/25/19 reduced from 50 mg daily 135 tablet 1   sucralfate (CARAFATE) 1 GM/10ML suspension Take 10 mLs (1 g total) by mouth 4 (four) times daily -  with meals and at bedtime. 1200 mL 0   tiZANidine (ZANAFLEX) 4 MG tablet Take 1 tablet by mouth as needed.     topiramate (TOPAMAX) 50 MG tablet Take 1 tablet (50 mg total) by mouth 2 (two) times daily. 60 tablet 0   traZODone (DESYREL) 100 MG tablet Take 3 tablets (300 mg total) by mouth at bedtime. 270 tablet 0   Wheat Dextrin (BENEFIBER PO) Take by mouth.     doxazosin (CARDURA) 2 MG tablet 1/2 tablet at night for 5 nights then 1 tablet for 5 mights, then 1 and 1/2 tablets at night 90 tablet 0   fluconazole (DIFLUCAN) 150 MG tablet      No facility-administered medications prior to visit.    Allergies  Allergen Reactions   Selenium Dioxide [Selenium] Anaphylaxis   Nsaids Other (See Comments)    Other reaction(s): Abdominal Pain Ulcerative colitis   Gluten Meal Other (See Comments)    Stomach upset   Milk-Related Compounds Other (See Comments)    Stomach upset   Phentermine     Pt stated, "Made my IC flare up"    ROS Review of Systems    Objective:    Physical Exam  BP 110/68    Pulse 73    Ht _0  (1.651 m)    Wt 194 lb (  88 kg)    LMP 03/10/2013    BMI 32.28 kg/m  Wt Readings from Last 3 Encounters:  06/01/21 194 lb (88 kg)   05/26/21 190 lb (86.2 kg)  07/15/20 190 lb (86.2 kg)     Health Maintenance Due  Topic Date Due   Pneumococcal Vaccine 68-1 Years old (1 - PCV) Never done   Zoster Vaccines- Shingrix (1 of 2) Never done   COVID-19 Vaccine (3 - Booster for Janssen series) 06/22/2020   INFLUENZA VACCINE  12/13/2020    There are no preventive care reminders to display for this patient.  Lab Results  Component Value Date   TSH 2.550 06/10/2019   Lab Results  Component Value Date   WBC 12.3 (H) 05/27/2021   HGB 13.9 05/27/2021   HCT 42.3 05/27/2021   MCV 87.6 05/27/2021   PLT 278 05/27/2021   Lab Results  Component Value Date   NA 137 05/27/2021   K 3.7 05/27/2021   CO2 26 05/27/2021   GLUCOSE 101 (H) 05/27/2021   BUN 18 05/27/2021   CREATININE 1.04 (H) 05/27/2021   BILITOT 0.4 05/27/2021   ALKPHOS 61 05/27/2021   AST 14 (L) 05/27/2021   ALT 13 05/27/2021   PROT 6.7 05/27/2021   ALBUMIN 3.8 05/27/2021   CALCIUM 8.8 (L) 05/27/2021   ANIONGAP 8 05/27/2021   Lab Results  Component Value Date   CHOL 187 06/10/2019   Lab Results  Component Value Date   HDL 68 06/10/2019   Lab Results  Component Value Date   LDLCALC 89 06/10/2019   Lab Results  Component Value Date   TRIG 181 (H) 06/10/2019   Lab Results  Component Value Date   CHOLHDL 2.7 08/04/2013   Lab Results  Component Value Date   HGBA1C 5.6 10/06/2019      Assessment & Plan:   Problem List Items Addressed This Visit   None   No orders of the defined types were placed in this encounter.   Follow-up: No follow-ups on file.    Tye Savoy, NP    ASSESSMENT AND PLAN      HISTORY OF PRESENT ILLNESS    Chief Complaint :  Marisa Hamilton is a 61 y.o. female known to Dr. Silverio Decamp with a past medical history of Schatzki's ring.  Additional medical history as listed in [place] .   Patient was in the ED 05/27/2021 with complaints of shortness of breath, abdominal pain, sudden inability to pass  food through her throat leading to vomiting. WBC 12.3.   CT chest abdomen pelvis with contrast showed a 9 mm nodule in the left lobe of the thyroid, and a heterogeneous nodule measuring 1.1 cm anteriorly to the left lobe.  Mild general thickening in the distal half of the thoracic esophagus without masslike thickening.  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    Patient was discharged home from the ED then subsequently called our office to get an appointment.  ED had given her Carafate to take 4 times daily and it was helping.  She was already on omeprazole 40 mg daily.  We called in a prescription for Magic mouthwash with viscous lidocaine.    Hepatic Function Latest Ref Rng & Units 05/27/2021 10/06/2019 06/10/2019  Total Protein 6.5 - 8.1 g/dL 6.7 6.7 7.5  Albumin 3.5 - 5.0 g/dL 3.8 4.2 4.7  AST 15 - 41 U/L 14(L)  24 26  ALT 0 - 44 U/L _0 Alk Phosphatase 38 - 126 U/L 61 67 72  Total Bilirubin 0.3 - 1.2 mg/dL 0.4 <0.2 0.2    CBC Latest Ref Rng & Units 05/27/2021 10/06/2019 06/10/2019  WBC 4.0 - 10.5 K/uL 12.3(H) 9.0 9.2  Hemoglobin 12.0 - 15.0 g/dL 13.9 14.0 14.5  Hematocrit 36.0 - 46.0 % 42.3 41.4 42.9  Platelets 150 - 400 K/uL 278 265 288    Lab Results  Component Value Date   LIPASE 24 05/27/2021   _1    PREVIOUS ENDOSCOPIC EVALUATIONS / PERTINENT STUDIES:    Current Medications, Allergies, Past Medical History, Past Surgical History, Family History and Social History were reviewed in [employer] record.     Current Outpatient Medications  Medication Sig Dispense Refill   amoxicillin (AMOXIL) 875 MG tablet SMARTSIG:1 Tablet(s) By Mouth Every 12 Hours     baclofen (LIORESAL) 10 MG tablet Take by mouth.     chlorproMAZINE (THORAZINE) 25 MG tablet TAKE 1 TABLET BY MOUTH 3 TIMES DAILY AS NEEDED FOR HEADACHE RESCUE     clonazePAM (KLONOPIN) 1 MG  tablet Take 1 tablet (1 mg total) by mouth at bedtime. 90 tablet 1   cloNIDine (CATAPRES) 0.1 MG tablet 1 daily prn anxiety 15 tablet 0   doxazosin (CARDURA) 2 MG tablet 1/2 tablet at night for 5 nights then 1 tablet for 5 mights, then 1 and 1/2 tablets at night 90 tablet 0   fluconazole (DIFLUCAN) 150 MG tablet      ketorolac (TORADOL) 60 MG/2ML SOLN injection Inject 60 mg into the muscle as needed (migraines).      lamoTRIgine (LAMICTAL) 100 MG tablet Take 1 tablet (100 mg total) by mouth daily. 90 tablet 1   Lasmiditan Succinate (REYVOW) 100 MG TABS Take 100 mg by mouth as needed.     loratadine (CLARITIN) 10 MG tablet Take 10 mg by mouth daily.     magic mouthwash (lidocaine, diphenhydrAMINE, alum & mag hydroxide) suspension 1 part each of viscous lidocaine 2% Maalox and benadryl 12.5 mg / 5 ml Gargle and swallow 5 ml 4 times daily PRN 300 mL 0   modafinil (PROVIGIL) 100 MG tablet Take 1 tablet (100 mg total) by mouth daily. 30 tablet 0   omeprazole (PRILOSEC) 40 MG capsule Take 1 capsule (40 mg total) by mouth daily. 90 capsule 3   ondansetron (ZOFRAN) 8 MG tablet Take 8 mg by mouth as needed.     sertraline (ZOLOFT) 25 MG tablet Take 1.5 tablets (37.5 mg total) by mouth daily. As of 08/25/19 reduced from 50 mg daily 135 tablet 1   sucralfate (CARAFATE) 1 GM/10ML suspension Take 10 mLs (1 g total) by mouth 4 (four) times daily -  with meals and at bedtime. 1200 mL 0   tiZANidine (ZANAFLEX) 4 MG tablet Take 1 tablet by mouth as needed.     topiramate (TOPAMAX) 50 MG tablet Take 1 tablet (50 mg total) by mouth 2 (two) times daily. 60 tablet 0   traZODone (DESYREL) 100 MG tablet Take 3 tablets (300 mg total) by mouth at bedtime. 270 tablet 0   Wheat Dextrin (BENEFIBER PO) Take by mouth.     No current facility-administered medications for this visit.    Review of Systems: No chest pain. No shortness of breath. No urinary complaints.   PHYSICAL EXAM :    Wt Readings from Last 3  Encounters:  06/01/21 194 lb (88 kg)  05/26/21 190 lb (86.2 kg)  07/15/20 190 lb (86.2 kg)    Ht _0  (1.651 m)    Wt 194 lb (88 kg)    LMP 03/10/2013    BMI 32.28 kg/m  Constitutional:  Generally well appearing ***female in no acute distress. Psychiatric: Pleasant. Normal mood and affect. Behavior is normal. EENT: Pupils normal.  Conjunctivae are normal. No scleral icterus. Neck supple.  Cardiovascular: Normal rate, regular rhythm. No edema Pulmonary/chest: Effort normal and breath sounds normal. No wheezing, rales or rhonchi. Abdominal: Soft, nondistended, nontender. Bowel sounds active throughout. There are no masses palpable. No hepatomegaly. Neurological: Alert and oriented to person place and time. Skin: Skin is warm and dry. No rashes noted.  Tye Savoy, NP  06/01/2021, 1:40 PM  Cc:  [name], MD

## 2021-06-01 NOTE — Progress Notes (Signed)
ASSESSMENT AND PLAN    # 61 year old female with chronic solid food dysphagia.  Mild Schatzki's ring on EGD March 2022.  No improvement with dilation.  Now with 2 recent episodes of regurgitation of food ( complete meal).  The most recent episode was earlier this month and it was followed by severe chest and upper abdominal pain prompting ED visit.  Chest CT scan showed thickening in the distal part of the thoracic esophagus.   -- She probably has reflux esophagitis.  No odynophagia to suggest infectious esophagitis.  She took a course of doxycycline which can cause esophageal ulcers and that may have contributed to her recent symptoms.  However it should be noted that her first episode of severe regurgitation in October preceded course of doxycycline.  --Continue Carafate before meals and at bedtime x3 weeks as it is helping. --Doxycycline already discontinued --Continue omeprazole 40 mg daily --Given for chronic solid food dysphagia which did not improve with esophageal dilation I am going to schedule her for a barium swallow with tablet to assess for esophageal dysmotility.   #Loose stool on Augmentin.   -- She will let us know if loose stools persist after completion of antibiotics  # Hepatic cyst. Recent chest CT scan >>,Bilobed left hepatic cyst again is noted, measuring 5.2 x 3.2 by 4.4 cm. This is slightly larger than the previous measurements but there is no mural nodularity or other suspicious features. The remaining liver is unremarkable.  -- I will asked Dr. Silverio Decamp to review scan and let me know if she would like additional or follow-up imaging.    HISTORY OF PRESENT ILLNESS    Chief Complaint :  heartburn, problems swallowing  Marisa Hamilton is a 61 y.o. female known to Dr. Silverio Decamp with a past medical history of chronic fatigue syndrome, fibromyalgia, IBS, GERD, adenomatous colon polyps, diverticulosis,  Schatzki's ring.  Additional medical history as listed in [place]  .    Patient was previously followed by [name].  She established care with Dr. Silverio Decamp November 2021 at that time for evaluation of constipation alternating diarrhea.   She was scheduled for colonoscopy  and EGD . A mild Schatzki's ring was found and dilated, EGD otherwise normal.  A few polyps were removed from the colon, a few diverticula were found in the sigmoid colon.  Patient says on a chronic basis she takes Pepcid, Zofran and Zofran for management of chronic upper abdominal pain.   05/27/21-  ED visit for complaints of shortness of breath, abdominal pain, sudden inability to pass food through her throat leading to vomiting. Her WBC was 12.3, labs otherwise unremarkable.  High-sensitivity troponin normal.  CT chest abdomen pelvis with contrast showed a 9 mm nodule in the left lobe of the thyroid, and a heterogeneous nodule measuring 1.1 cm anteriorly to the left lobe.  Mild general thickening in the distal half of the thoracic esophagus without masslike thickening.  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     Patient was discharged home from the ED then subsequently called our office to get an appointment.  ED had given her Carafate to take 4 times daily and it was helping.  She was already on omeprazole 40 mg daily.  We called in a prescription for Magic mouthwash with viscous lidocaine.   [name] gives a history of chronic intermittent dysphagia to pills and solid food .  In October she had a sudden episode of swallowing problems.  She was at dinner and swallowing fine and then all of a sudden started coughing with subsequent heaving of everything that she had just swallowed.  This was without any associated nausea.  Despite her history of intermittent solid food dysphagia she has never had anything like this happen before.  On the day she went to the ED 05/27/2021 the same thing happened.  She had been  taking small bites of lasagna.  No nausea but then all of a sudden started coughing and then regurgitated everything that she had just swallowed.  Following this she had severe burning and pressure in her chest.  She felt like someone was choking her.  Following this she developed generalized upper abdominal pain.  Earlier in the month patient had taken a weeks worth of doxycycline.  Given her symptoms and suggestion of esophagitis on CT scan the ED stopped doxycycline and change her to Augmentin for sinus infection.   Hepatic Function Latest Ref Rng & Units 05/27/2021 10/06/2019 06/10/2019  Total Protein 6.5 - 8.1 g/dL 6.7 6.7 7.5  Albumin 3.5 - 5.0 g/dL 3.8 4.2 4.7  AST 15 - 41 U/L 14(L) 24 26  ALT 0 - 44 U/L $Remo'13 23 22  'JpumS$ Alk Phosphatase 38 - 126 U/L 61 67 72  Total Bilirubin 0.3 - 1.2 mg/dL 0.4 <0.2 0.2    CBC Latest Ref Rng & Units 05/27/2021 10/06/2019 06/10/2019  WBC 4.0 - 10.5 K/uL 12.3(H) 9.0 9.2  Hemoglobin 12.0 - 15.0 g/dL 13.9 14.0 14.5  Hematocrit 36.0 - 46.0 % 42.3 41.4 42.9  Platelets 150 - 400 K/uL 278 265 288    Lab Results  Component Value Date   LIPASE 24 05/27/2021    PREVIOUS ENDOSCOPIC EVALUATIONS / PERTINENT STUDIES:   December 2018 4-hour gastric emptying study --Normal  March 2022 EGD --The gastroesophageal flap valve was visualized endoscopically and classified as Hill Grade III (minimal fold, loose to endoscope, hiatal hernia likely). Findings: - A mild Schatzki ring was found in the distal esophagus. The scope was withdrawn. Dilation was performed with a Maloney dilator with no resistance at 40 Fr. The dilation site was examined following endoscope reinsertion and showed no change. - The Z-line was regular and was found 37 cm from the incisors. - The stomach was normal. - The examined duodenum was normal.  March 2022 colonoscopy --The perianal and digital rectal examinations were normal. - Three sessile polyps were found in the ascending colon and cecum. The  polyps were 5 to 11 mm in size. These polyps were removed with a cold snare. Resection and retrieval were complete. - A few small-mouthed diverticula were found in the sigmoid colon. - Non-bleeding internal hemorrhoids were found during retroflexion. The hemorrhoids were medium-sized. - The exam was otherwise without abnormality  Current Medications, Allergies, Past Medical History, Past Surgical History, Family History and Social History were reviewed in [employer] record.     Current Outpatient Medications  Medication Sig Dispense Refill   amoxicillin (AMOXIL) 875 MG tablet SMARTSIG:1 Tablet(s) By Mouth Every 12 Hours     baclofen (LIORESAL) 10 MG tablet Take by mouth.     chlorproMAZINE (THORAZINE) 25 MG tablet TAKE 1 TABLET BY MOUTH 3 TIMES DAILY AS NEEDED FOR HEADACHE RESCUE     clonazePAM (KLONOPIN) 1 MG tablet Take 1 tablet (1 mg total) by mouth at bedtime. 90 tablet 1   cloNIDine (CATAPRES) 0.1 MG tablet 1 daily  prn anxiety 15 tablet 0   ketorolac (TORADOL) 60 MG/2ML SOLN injection Inject 60 mg into the muscle as needed (migraines).      lamoTRIgine (LAMICTAL) 100 MG tablet Take 1 tablet (100 mg total) by mouth daily. 90 tablet 1   Lasmiditan Succinate (REYVOW) 100 MG TABS Take 100 mg by mouth as needed.     loratadine (CLARITIN) 10 MG tablet Take 10 mg by mouth daily.     magic mouthwash (lidocaine, diphenhydrAMINE, alum & mag hydroxide) suspension 1 part each of viscous lidocaine 2% Maalox and benadryl 12.5 mg / 5 ml Gargle and swallow 5 ml 4 times daily PRN 300 mL 0   modafinil (PROVIGIL) 100 MG tablet Take 1 tablet (100 mg total) by mouth daily. 30 tablet 0   omeprazole (PRILOSEC) 40 MG capsule Take 1 capsule (40 mg total) by mouth daily. 90 capsule 3   ondansetron (ZOFRAN) 8 MG tablet Take 8 mg by mouth as needed.     sertraline (ZOLOFT) 25 MG tablet Take 1.5 tablets (37.5 mg total) by mouth daily. As of 08/25/19 reduced from 50 mg daily 135 tablet 1    sucralfate (CARAFATE) 1 GM/10ML suspension Take 10 mLs (1 g total) by mouth 4 (four) times daily -  with meals and at bedtime. 1200 mL 0   tiZANidine (ZANAFLEX) 4 MG tablet Take 1 tablet by mouth as needed.     topiramate (TOPAMAX) 50 MG tablet Take 1 tablet (50 mg total) by mouth 2 (two) times daily. 60 tablet 0   traZODone (DESYREL) 100 MG tablet Take 3 tablets (300 mg total) by mouth at bedtime. 270 tablet 0   Wheat Dextrin (BENEFIBER PO) Take by mouth.     No current facility-administered medications for this visit.    Review of Systems: No unexplained weight loss. No shortness of breath. No urinary complaints.   PHYSICAL EXAM :    Wt Readings from Last 3 Encounters:  06/01/21 194 lb (88 kg)  05/26/21 190 lb (86.2 kg)  07/15/20 190 lb (86.2 kg)    BP 110/68    Pulse 73    Ht $R'5\' 5"'MG$  (1.651 m)    Wt 194 lb (88 kg)    LMP 03/10/2013    BMI 32.28 kg/m  Constitutional:  Generally well appearing female in no acute distress. Psychiatric: Pleasant. Normal mood and affect. Behavior is normal. EENT: Pupils normal.  Conjunctivae are normal. No scleral icterus. Neck supple.  Cardiovascular: Normal rate, regular rhythm. No edema Pulmonary/chest: Effort normal and breath sounds normal. No wheezing, rales or rhonchi. Abdominal: Soft, nondistended, nontender. Bowel sounds active throughout. There are no masses palpable. No hepatomegaly. Neurological: Alert and oriented to person place and time. Skin: Skin is warm and dry. No rashes noted.  Tye Savoy, NP  06/01/2021, 2:16 PM

## 2021-06-01 NOTE — Patient Instructions (Signed)
Continue Carafate 30 minutes prior to breakfast, lunch and dinner and then at bedtime for 3 weeks. Separate Carafate from other medications by 2 hours.  Continue Omeprazole.  Stop Magic Mouthwash.   Call or send Mychart message if persistent loose stools after completing Augmentin.   You have been scheduled for a Barium Esophogram at [facility] (1st floor of the hospital) on Wednesday 06/08/21 at 9:30 am. Please arrive 15 minutes prior to your appointment for registration. Make certain not to have anything to eat or drink 3 hours prior to your test. If you need to reschedule for any reason, please contact radiology at [phone] to do so. __________________________________________________________________ A barium swallow is an examination that concentrates on views of the esophagus. This tends to be a double contrast exam (barium and two liquids which, when combined, create a gas to distend the wall of the oesophagus) or single contrast (non-ionic iodine based). The study is usually tailored to your symptoms so a good history is essential. Attention is paid during the study to the form, structure and configuration of the esophagus, looking for functional disorders (such as aspiration, dysphagia, achalasia, motility and reflux) EXAMINATION You may be asked to change into a gown, depending on the type of swallow being performed. A radiologist and radiographer will perform the procedure. The radiologist will advise you of the type of contrast selected for your procedure and direct you during the exam. You will be asked to stand, sit or lie in several different positions and to hold a small amount of fluid in your mouth before being asked to swallow while the imaging is performed .In some instances you may be asked to swallow barium coated marshmallows to assess the motility of a solid food bolus. The exam can be recorded as a digital or video fluoroscopy procedure. POST PROCEDURE It will  take 1-2 days for the barium to pass through your system. To facilitate this, it is important, unless otherwise directed, to increase your fluids for the next 24-48hrs and to resume your normal diet.  This test typically takes about 30 minutes to perform. ___________________________________________________________________  Acid Reflux --If you are taking anti-reflux medication be sure to take them 30 minutes before meal(s) --To help with acid reflux symptoms you should avoid evening meals / bedtime snacks. If able, elevate head of bed 6-8 inches. If unable to elevate the head of the bed consider purchasing a wedge pillow to sleep on at night.    --Weight reduction / maintain a healthy BMI ( body mass index) may be help with reflux symptoms  --Avoid trigger foods ( foods which you know tend to aggravate you reflux symptoms). Some of the more common triggers include spicy foods, fatty foods, acidic foods, and chocolate --Avoid caffeine  Continue Omeprazole 40 mg 30 minutes prior to breakfast.

## 2021-06-02 ENCOUNTER — Encounter: Payer: Self-pay | Admitting: Nurse Practitioner

## 2021-06-07 NOTE — Progress Notes (Signed)
We can consider EGD if continues to have dysphagia symptoms for repeat esophageal dilation even though last EGD was less than a year ago.  Will need to exclude erosive esophagitis due to doxycycline or Candida esophagitis. She should follow-up with pulmonary for the nodules and also has thyroid nodules for which she will need follow-up endocrinology Hepatic cyst appears benign based on findings on imaging, will plan for repeat imaging with dedicated MRI liver or CT liver protocol in 6 months to document stability and exclude any interval increase in size  Reviewed and agree with documentation and assessment and plan. Damaris Hippo , MD

## 2021-06-08 ENCOUNTER — Ambulatory Visit (HOSPITAL_COMMUNITY)
Admission: RE | Admit: 2021-06-08 | Discharge: 2021-06-08 | Disposition: A | Discharge status: Home / Self Care | Payer: BC Managed Care – PPO | Source: Ambulatory Visit | Attending: Nurse Practitioner | Admitting: Nurse Practitioner

## 2021-06-08 ENCOUNTER — Other Ambulatory Visit: Payer: Self-pay | Admitting: Nurse Practitioner

## 2021-06-08 ENCOUNTER — Other Ambulatory Visit: Payer: Self-pay

## 2021-06-08 DIAGNOSIS — K219 Gastro-esophageal reflux disease without esophagitis: Secondary | ICD-10-CM | POA: Diagnosis not present

## 2021-06-08 DIAGNOSIS — R1310 Dysphagia, unspecified: Secondary | ICD-10-CM | POA: Diagnosis not present

## 2021-06-08 DIAGNOSIS — K224 Dyskinesia of esophagus: Secondary | ICD-10-CM | POA: Diagnosis not present

## 2021-06-09 ENCOUNTER — Other Ambulatory Visit: Payer: Self-pay

## 2021-06-09 DIAGNOSIS — R918 Other nonspecific abnormal finding of lung field: Secondary | ICD-10-CM

## 2021-06-09 DIAGNOSIS — E041 Nontoxic single thyroid nodule: Secondary | ICD-10-CM

## 2021-06-09 DIAGNOSIS — R9389 Abnormal findings on diagnostic imaging of other specified body structures: Secondary | ICD-10-CM

## 2021-06-11 ENCOUNTER — Telehealth: Payer: Self-pay | Admitting: Gastroenterology

## 2021-06-11 MED ORDER — DICYCLOMINE HCL 10 MG PO CAPS: 10.0000 mg | ORAL_CAPSULE | Freq: Three times a day (TID) | ORAL | 0 refills | Status: DC

## 2021-06-11 NOTE — Telephone Encounter (Signed)
Patient calling with ongoing complaints of diarrhea/altered bowel habits along with abdominal cramping after taking a course of Augmentin.  She was initially prescribed doxycycline for sinus infection and was changed to Augmentin.  She completed that 3 to 4 days ago, but has continued to have the symptoms stated above.  She says that some days it is all diarrhea, but some days she has a normal stool and other times she is feeling constipated.  She is having cramping as well.  Not seeing any blood.  No fevers.  She says that she will sit on the toilet and feeling that she has to have a bowel movement, but then nothing happens.  Do not know if she has C. difficile since she is also having days of normal stools or if she just had a disrupt in her normal gut flora from the antibiotics.  I advised her to begin taking a probiotic.  She does have some at home.  Advised to stay hydrated well with Gatorade, water, Pedialyte.  I recommended following a bland diet for the next several days.  I am sending a prescription for dicyclomine 10 mg 4 times daily to her pharmacy to use for intestinal spasm and cramping.  We will have our office reach out to her on Monday to get an update on her symptoms and determine need for her to submit stool study, have labs performed, etc.  She is advised to go to the emergency department if there is concern for dehydration, fevers, worsening abdominal pain, bloody stool, etc.

## 2021-06-13 ENCOUNTER — Other Ambulatory Visit: Payer: Self-pay

## 2021-06-13 MED ORDER — ONDANSETRON HCL 4 MG PO TABS: 4.0000 mg | ORAL_TABLET | Freq: Three times a day (TID) | ORAL | 0 refills | Status: DC | PRN

## 2021-06-13 NOTE — Telephone Encounter (Signed)
Patient is returning your call.  

## 2021-06-13 NOTE — Telephone Encounter (Signed)
Spoke with the patient. She tells me though today is better, she is still having stomach pain. "My stomach just does not feel good." It is not worsened or improved with eating. Her diet remains very bland and small meals. Focused on hydration. She no longer has diarrhea. She now has the sensation of needing to move her bowels, but not passing anything. She does find that the Ondansetron does help some. She takes Carafate as directed with only a few issues "It is so hard to time it right with that medication." She has restarted her probiotic VISBIOME and has taken this for 3 or 4 days. No change in her symptoms with this either.

## 2021-06-13 NOTE — Telephone Encounter (Signed)
Called the patient. No answer. Left a message asking she call back with an update of her progress or send a message through My Chart.

## 2021-06-14 NOTE — Telephone Encounter (Signed)
Hold probiotics.  Okay to use Zofran 4 mg  daily as needed for severe nausea.  Please schedule office follow-up visit, will consider EGD or repeat imaging if continues to have persistent stomach pain.  Thank you

## 2021-06-15 ENCOUNTER — Encounter: Payer: Self-pay | Admitting: Psychiatry

## 2021-06-15 ENCOUNTER — Ambulatory Visit (INDEPENDENT_AMBULATORY_CARE_PROVIDER_SITE_OTHER): Payer: BC Managed Care – PPO | Admitting: Psychiatry

## 2021-06-15 ENCOUNTER — Other Ambulatory Visit: Payer: Self-pay

## 2021-06-15 DIAGNOSIS — F5105 Insomnia due to other mental disorder: Secondary | ICD-10-CM

## 2021-06-15 DIAGNOSIS — F331 Major depressive disorder, recurrent, moderate: Secondary | ICD-10-CM

## 2021-06-15 DIAGNOSIS — F411 Generalized anxiety disorder: Secondary | ICD-10-CM

## 2021-06-15 DIAGNOSIS — F900 Attention-deficit hyperactivity disorder, predominantly inattentive type: Secondary | ICD-10-CM

## 2021-06-15 DIAGNOSIS — F99 Mental disorder, not otherwise specified: Secondary | ICD-10-CM

## 2021-06-15 DIAGNOSIS — F515 Nightmare disorder: Secondary | ICD-10-CM | POA: Diagnosis not present

## 2021-06-15 DIAGNOSIS — F3342 Major depressive disorder, recurrent, in full remission: Secondary | ICD-10-CM

## 2021-06-15 MED ORDER — LAMOTRIGINE 100 MG PO TABS: 100.0000 mg | ORAL_TABLET | Freq: Every day | ORAL | 1 refills | Status: DC

## 2021-06-15 MED ORDER — CLONAZEPAM 1 MG PO TABS: 1.0000 mg | ORAL_TABLET | Freq: Every day | ORAL | 1 refills | Status: DC

## 2021-06-15 MED ORDER — TRAZODONE HCL 100 MG PO TABS: 300.0000 mg | ORAL_TABLET | Freq: Every day | ORAL | 0 refills | Status: DC

## 2021-06-15 NOTE — Telephone Encounter (Signed)
Left message to call back. Scheduled an appointment with Tye Savoy, NP if she wants it. 06/30/21.

## 2021-06-15 NOTE — Patient Instructions (Signed)
(  NA[facility])  N-Acetylcysteine at 600 mg 2 daily to help with mild cognitive problems.
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
## 2021-06-16 NOTE — Telephone Encounter (Signed)
Reviewed how and when to take Zofran. Patient reports continued sinus pressure and pain. Encouraged her to reach out to her PCP with these concerns. Very briefly discuss supportive measures such as saline nose spray and hot tea.

## 2021-06-16 NOTE — Telephone Encounter (Addendum)
Patient returned your call, I advised patient about the appointment with Nevin Bloodgood on 2/16. Patient stated that she can not come on Thursday and wanted to be rescheduled. Patient was scheduled for 2/17 at 11:00. Patient also stated that she had some question for you and is requesting a call back. Please advise.

## 2021-06-22 ENCOUNTER — Encounter: Payer: Self-pay | Admitting: Emergency Medicine

## 2021-06-22 ENCOUNTER — Ambulatory Visit: Payer: BC Managed Care – PPO | Admitting: Emergency Medicine

## 2021-06-22 ENCOUNTER — Other Ambulatory Visit: Payer: Self-pay

## 2021-06-22 DIAGNOSIS — R0600 Dyspnea, unspecified: Secondary | ICD-10-CM | POA: Insufficient documentation

## 2021-06-22 DIAGNOSIS — R0602 Shortness of breath: Secondary | ICD-10-CM

## 2021-06-22 DIAGNOSIS — R918 Other nonspecific abnormal finding of lung field: Secondary | ICD-10-CM

## 2021-06-22 DIAGNOSIS — E042 Nontoxic multinodular goiter: Secondary | ICD-10-CM

## 2021-06-22 NOTE — Assessment & Plan Note (Signed)
Recommend thyroid ultrasound to better evaluate, determine whether biopsies are indicated here.  I can order the thyroid ultrasound, would defer subsequent work-up to her PCP Dr. Stephanie Acre

## 2021-06-22 NOTE — Patient Instructions (Signed)
We will repeat your CT scan of the chest in July 2023 to follow small pulmonary nodules We will order a thyroid ultrasound for you to follow-up with your primary care physician. We will arrange for pulmonary function testing at your next office visit to evaluate your shortness of breath. Follow Dr. Lamonte Sakai next available with PFT on the same day

## 2021-06-22 NOTE — Addendum Note (Signed)
Addended by: Gavin Potters R on: 06/22/2021 03:14 PM   Modules accepted: Orders

## 2021-06-22 NOTE — Progress Notes (Signed)
Subjective:    Patient ID: Marisa Hamilton, female    DOB: 1960/10/15, 61 y.o.   MRN: [id number]  HPI 61 year old never smoker with a history of chronic fatigue, fibromyalgia, interstitial cystitis, IBS, restless legs (seen previously by Dr. Gwenette Greet).  She has been evaluated by gastroenterology, most recently for dysphagia symptoms with a Schatzki's ring.  She had CT scan imaging that showed thyroid nodules and also pulmonary nodules.  She mentions today some progressive exertional dyspnea, chest tightness that began when she had COVID-19 in November.  Somewhat decreased functional capacity.  CT chest abdomen pelvis 05/27/2021 reviewed by me, shows a posterior left lobe thyroid nodule 9 mm, heterogeneous 1.1 cm anterior left lobe thyroid nodule.  There is a lateral noncalcified 7 mm right upper lobe nodule with some pleural stranding, 6.5 mm pleural-based right upper lobe nodule, 6 mm right lower lobe nodule, 5 mm right lower lobe nodule.  Multiple 2 to 3 mm left upper lobe subpleural nodules.  Also noted was mild subpleural interstitial reticulation without honeycomb change   Review of Systems As per HPI  Past Medical History:  Diagnosis Date   Anxiety    Back pain    CFS (chronic fatigue syndrome)    Concussion 03-17-17   Constipation    Cystitis, interstitial    HAD BLADDER SX FOR SAME   Depression    Fainting spell    Fibromyalgia    Food allergy    Foot pain    GERD (gastroesophageal reflux disease)    Headache(784.0)    IBS (irritable bowel syndrome)    TAKES PROBIOTICS   Insomnia    Lactose intolerance    Leg pain    Migraine    MVP (mitral valve prolapse)    Nausea    Neck pain    PONV (postoperative nausea and vomiting)    Restless leg syndrome    Stomach ache    Ulcerative colitis ([place])    Varicose veins      Family History  Problem Relation Age of Onset   Osteoporosis Mother    Atrial fibrillation Mother    Heart disease Mother    Stroke Mother     Depression Mother    Anxiety disorder Mother    Obesity Mother    Diabetes Father    Hypertension Father    Depression Father    Obesity Father    Breast cancer Maternal Grandmother    Cancer Maternal Grandmother        ovarian   Diabetes Paternal Grandmother    Migraines Brother        CHRONIC   Colon cancer Neg Hx    Esophageal cancer Neg Hx    Pancreatic cancer Neg Hx    Stomach cancer Neg Hx    Liver disease Neg Hx    Rectal cancer Neg Hx    Colon polyps Neg Hx      Social History   Socioeconomic History   Marital status: Married    Spouse name: Chip Skillman   Number of children: Not on file   Years of education: Not on file   Highest education level: Not on file  Occupational History   Occupation: Retired Wellsite geologist  Tobacco Use   Smoking status: Never   Smokeless tobacco: Never  Vaping Use   Vaping Use: Never used  Substance and Sexual Activity   Alcohol use: Not Currently   Drug use: No   Sexual activity: Yes    Partners:  Male    Birth control/protection: Other-see comments, Post-menopausal    Comment: husband vasectomy  Other Topics Concern   Not on file  Social History Narrative   Not on file   Social Determinants of Health   Financial Resource Strain: Not on file  Food Insecurity: Not on file  Transportation Needs: Not on file  Physical Activity: Not on file  Stress: Not on file  Social Connections: Not on file  Intimate Partner Violence: Not on file     Allergies  Allergen Reactions   Selenium Dioxide [Selenium] Anaphylaxis   Nsaids Other (See Comments)    Other reaction(s): Abdominal Pain Ulcerative colitis   Gluten Meal Other (See Comments)    Stomach upset   Milk-Related Compounds Other (See Comments)    Stomach upset   Phentermine     Pt stated, "Made my IC flare up"     Outpatient Medications Prior to Visit  Medication Sig Dispense Refill   Amino Acids (PREPROTEIN 20 PO) Take by mouth.     baclofen (LIORESAL) 10 MG  tablet Take by mouth.     chlorproMAZINE (THORAZINE) 25 MG tablet TAKE 1 TABLET BY MOUTH 3 TIMES DAILY AS NEEDED FOR HEADACHE RESCUE     clonazePAM (KLONOPIN) 1 MG tablet Take 1 tablet (1 mg total) by mouth at bedtime. 90 tablet 1   cloNIDine (CATAPRES) 0.1 MG tablet 1 daily prn anxiety 15 tablet 0   ketorolac (TORADOL) 60 MG/2ML SOLN injection Inject 60 mg into the muscle as needed (migraines).      lamoTRIgine (LAMICTAL) 100 MG tablet Take 1 tablet (100 mg total) by mouth daily. 90 tablet 1   Lasmiditan Succinate (REYVOW) 100 MG TABS Take 100 mg by mouth as needed.     loratadine (CLARITIN) 10 MG tablet Take 10 mg by mouth daily.     magic mouthwash (lidocaine, diphenhydrAMINE, alum & mag hydroxide) suspension 1 part each of viscous lidocaine 2% Maalox and benadryl 12.5 mg / 5 ml Gargle and swallow 5 ml 4 times daily PRN 300 mL 0   modafinil (PROVIGIL) 100 MG tablet Take 1 tablet (100 mg total) by mouth daily. 30 tablet 0   omeprazole (PRILOSEC) 40 MG capsule TAKE 1 CAPSULE BY MOUTH EVERY DAY 90 capsule 3   ondansetron (ZOFRAN) 4 MG tablet Take 1 tablet (4 mg total) by mouth every 8 (eight) hours as needed for nausea or vomiting. 20 tablet 0   PREMPRO 0.625-2.5 MG tablet Take 1 tablet by mouth daily.     sertraline (ZOLOFT) 25 MG tablet Take 1.5 tablets (37.5 mg total) by mouth daily. As of 08/25/19 reduced from 50 mg daily 135 tablet 1   sucralfate (CARAFATE) 1 GM/10ML suspension Take 10 mLs (1 g total) by mouth 4 (four) times daily -  with meals and at bedtime. 1200 mL 0   tiZANidine (ZANAFLEX) 4 MG tablet Take 1 tablet by mouth as needed.     topiramate (TOPAMAX) 50 MG tablet Take 1 tablet (50 mg total) by mouth 2 (two) times daily. (Patient taking differently: Take 50 mg by mouth 2 (two) times daily. Once daily) 60 tablet 0   traZODone (DESYREL) 100 MG tablet Take 3 tablets (300 mg total) by mouth at bedtime. HOLD THIS UNTIL SHE CALLS 270 tablet 0   Wheat Dextrin (BENEFIBER PO) Take by mouth.      dicyclomine (BENTYL) 10 MG capsule Take 1 capsule (10 mg total) by mouth 4 (four) times daily -  before meals  and at bedtime. (Patient not taking: Reported on 06/22/2021) 90 capsule 0   No facility-administered medications prior to visit.         Objective:   Physical Exam Vitals:   06/22/21 1420  BP: 128/78  Pulse: 76  Temp: 98 F (36.7 C)  TempSrc: Oral  SpO2: 96%  Weight: 196 lb 6.4 oz (89.1 kg)  Height: 5' 5"  (1.651 m)    Gen: Pleasant, well-nourished, in no distress,  normal affect  ENT: No lesions,  mouth clear,  oropharynx clear, no postnasal drip  Neck: No JVD, no stridor  Lungs: No use of accessory muscles, no crackles or wheezing on normal respiration, no wheeze on forced expiration  Cardiovascular: RRR, heart sounds normal, no murmur or gallops, no peripheral edema  Musculoskeletal: No deformities, no cyanosis or clubbing  Neuro: alert, awake, non focal  Skin: Warm, no lesions or rash      Assessment & Plan:  Pulmonary nodules Multiple scattered pulmonary nodules noted on CT scan 05/2021.  Appearance most consistent with subpleural lymph nodes.  Low risk patient for malignancy.  These will need to be followed based on size.  Neck scan should be in 6 months.  Multiple thyroid nodules Recommend thyroid ultrasound to better evaluate, determine whether biopsies are indicated here.  I can order the thyroid ultrasound, would defer subsequent work-up to her PCP Dr. Stephanie Acre  Dyspnea Dyspnea and chest tightness that involved that she had COVID-19 in November 2022.  No evidence of significant interstitial lung disease on her CT chest.  Lung exam is clear.  Question whether she may have some evolving obstructive lung disease.  Pulmonary function testing will be performed and we will review.   Baltazar Apo, MD, PhD 06/22/2021, 2:43 PM [facility] [phone] or if no answer before 7:00PM call [phone] For any issues after 7:00PM  please call eLink [phone]

## 2021-06-22 NOTE — Assessment & Plan Note (Signed)
Dyspnea and chest tightness that involved that she had COVID-19 in November 2022.  No evidence of significant interstitial lung disease on her CT chest.  Lung exam is clear.  Question whether she may have some evolving obstructive lung disease.  Pulmonary function testing will be performed and we will review.

## 2021-06-22 NOTE — Assessment & Plan Note (Signed)
Multiple scattered pulmonary nodules noted on CT scan 05/2021.  Appearance most consistent with subpleural lymph nodes.  Low risk patient for malignancy.  These will need to be followed based on size.  Neck scan should be in 6 months.

## 2021-06-29 ENCOUNTER — Other Ambulatory Visit: Payer: Self-pay

## 2021-06-29 ENCOUNTER — Ambulatory Visit (HOSPITAL_BASED_OUTPATIENT_CLINIC_OR_DEPARTMENT_OTHER)
Admission: RE | Admit: 2021-06-29 | Discharge: 2021-06-29 | Disposition: A | Discharge status: Home / Self Care | Payer: BC Managed Care – PPO | Source: Ambulatory Visit | Attending: Emergency Medicine | Admitting: Emergency Medicine

## 2021-06-29 DIAGNOSIS — E042 Nontoxic multinodular goiter: Secondary | ICD-10-CM | POA: Insufficient documentation

## 2021-06-30 ENCOUNTER — Ambulatory Visit: Payer: BC Managed Care – PPO | Admitting: Nurse Practitioner

## 2021-07-01 ENCOUNTER — Ambulatory Visit: Payer: BC Managed Care – PPO | Admitting: Nurse Practitioner

## 2021-07-01 ENCOUNTER — Telehealth: Payer: Self-pay | Admitting: Nurse Practitioner

## 2021-07-01 NOTE — Telephone Encounter (Signed)
Inbound call from patient had to cancel appt for today 2/17 due to having a sinus infection. Should would like a call back to discuss stomach discomfort

## 2021-07-04 ENCOUNTER — Telehealth: Payer: Self-pay | Admitting: Emergency Medicine

## 2021-07-04 DIAGNOSIS — J329 Chronic sinusitis, unspecified: Secondary | ICD-10-CM | POA: Diagnosis not present

## 2021-07-04 DIAGNOSIS — R5383 Other fatigue: Secondary | ICD-10-CM | POA: Diagnosis not present

## 2021-07-04 NOTE — Telephone Encounter (Signed)
Left message on machine to call back  

## 2021-07-04 NOTE — Telephone Encounter (Signed)
07/01/2021  5:54 PM EST     Please let the patient know that I reviewed her thyroid ultrasound.  One of her thyroid nodules meets criteria for possible needle biopsy.  I would like for her to discuss with Dr Stephanie Acre and decide next steps in the evaluation.  Thanks.    I called and spoke with the pt and notified of results per Dr Lamonte Sakai. Pt verbalized understanding. I have faxed a copy to Dr Stephanie Acre PCP.

## 2021-07-04 NOTE — Progress Notes (Signed)
I spoke with the pt and notified of results and she verbalized understanding. Copy of this routed to PCP.

## 2021-07-11 DIAGNOSIS — E042 Nontoxic multinodular goiter: Secondary | ICD-10-CM | POA: Diagnosis not present

## 2021-07-11 DIAGNOSIS — J309 Allergic rhinitis, unspecified: Secondary | ICD-10-CM | POA: Diagnosis not present

## 2021-07-11 NOTE — Progress Notes (Signed)
61 y.o. [id number] Married Caucasian female here for annual exam.    On HRT through Dr. Justin Mend since fall 2022.  She started it due to increased heat and difficulty controlling her body temperature.  This is going on for 20 years.  Her thyroid function is normal per patient, however she is having a thyroid nodule biopsy.   She had an abdominoplasty 9 years ago, and she wants to have a potential modification done on her incision.   Asking about her last BMD from last fall.   Not happy with her weight.  She went to healthy weight and wellness for one year.  Gained weight with use of Gabapentin in the past.   Dealing with long COVID.  PCP: Jonathon Jordan, MD    Patient's last menstrual period was 03/10/2013.           Sexually active: Yes.    The current method of family planning is vasectomy.    Exercising: No.  The patient does not participate in regular exercise at present. Smoker:  no  Health Maintenance: Pap:  11-20-18 Neg:Neg HR HPV, 08-23-16 Neg, 08-06-14 Neg:Neg HR HPV History of abnormal Pap:  no MMG:  08-20-20 Diag Bil/w/Lt.Br.US/Neg/BiRads1/screening 96yr  Has appointment on August 22, 2020.   Colonoscopy:  07-15-20 polyps;next 3-5 years BMD:   02-08-21  Result  Osteopenia TDaP:  2020 Gardasil:   n/a [id number] Hep C: 2017 Neg Screening Labs:  PCP.    reports that she has never smoked. She has never used smokeless tobacco. She reports that she does not currently use alcohol. She reports that she does not use drugs.  Past Medical History:  Diagnosis Date   Anxiety    Back pain    CFS (chronic fatigue syndrome)    Concussion 03-17-17   Constipation    Cystitis, interstitial    HAD BLADDER SX FOR SAME   Depression    Fainting spell    Fibromyalgia    Food allergy    Foot pain    GERD (gastroesophageal reflux disease)    Headache(784.0)    IBS (irritable bowel syndrome)    TAKES PROBIOTICS   Insomnia    Lactose intolerance    Leg pain    Migraine    MVP (mitral  valve prolapse)    Nausea    Neck pain    PONV (postoperative nausea and vomiting)    Restless leg syndrome    Stomach ache    Ulcerative colitis ([place]    Varicose veins     Past Surgical History:  Procedure Laterality Date   ABDOMINOPLASTY  4/14   BLADDER SURGERY     FOR IC   BREAST BIOPSY Left 02-26-12   negative   BREAST REDUCTION SURGERY  10/10/2011   Procedure: MAMMARY REDUCTION  (BREAST);  Surgeon: MCharlene Brooke MD;  Location: [facility]  Service: Plastics;  Laterality: Bilateral;   COLONOSCOPY  10/22/2015   Dr.Mann   LASIK     NASAL SEPTUM SURGERY     REDUCTION MAMMAPLASTY Bilateral    VASCULAR SURGERY     VARICOSE VEINS    Current Outpatient Medications  Medication Sig Dispense Refill   baclofen (LIORESAL) 10 MG tablet Take by mouth.     chlorproMAZINE (THORAZINE) 25 MG tablet TAKE 1 TABLET BY MOUTH 3 TIMES DAILY AS NEEDED FOR HEADACHE RESCUE     clonazePAM (KLONOPIN) 1 MG tablet Take 1 tablet (1 mg total) by mouth at bedtime. 90 tablet 1  cloNIDine (CATAPRES) 0.1 MG tablet 1 daily prn anxiety 15 tablet 0   famotidine (PEPCID) 20 MG tablet Take 20 mg by mouth daily as needed for heartburn or indigestion.     ketorolac (TORADOL) 60 MG/2ML SOLN injection Inject 60 mg into the muscle as needed (migraines).      lamoTRIgine (LAMICTAL) 100 MG tablet Take 1 tablet (100 mg total) by mouth daily. 90 tablet 1   Lasmiditan Succinate (REYVOW) 100 MG TABS Take 100 mg by mouth as needed.     loratadine (CLARITIN) 10 MG tablet Take 10 mg by mouth daily.     modafinil (PROVIGIL) 100 MG tablet Take 1 tablet (100 mg total) by mouth daily. 30 tablet 0   omeprazole (PRILOSEC) 40 MG capsule TAKE 1 CAPSULE BY MOUTH EVERY DAY 90 capsule 3   ondansetron (ZOFRAN) 4 MG tablet Take 1 tablet (4 mg total) by mouth every 8 (eight) hours as needed for nausea or vomiting. 20 tablet 0   PREMPRO 0.625-2.5 MG tablet Take 1 tablet by mouth daily.     Rimegepant Sulfate (NURTEC)  75 MG TBDP Take by mouth.     rizatriptan (MAXALT) 10 MG tablet Take by mouth.     sertraline (ZOLOFT) 25 MG tablet Take 1.5 tablets (37.5 mg total) by mouth daily. As of 08/25/19 reduced from 50 mg daily 135 tablet 1   simethicone (MYLICON) 867 MG chewable tablet Chew 125 mg by mouth every 6 (six) hours as needed for flatulence.     tiZANidine (ZANAFLEX) 4 MG tablet Take 1 tablet by mouth as needed.     topiramate (TOPAMAX) 50 MG tablet Take 1 tablet (50 mg total) by mouth 2 (two) times daily. (Patient taking differently: Take 50 mg by mouth 2 (two) times daily. Once daily) 60 tablet 0   traZODone (DESYREL) 100 MG tablet Take 3 tablets (300 mg total) by mouth at bedtime. HOLD THIS UNTIL SHE CALLS 270 tablet 0   Wheat Dextrin (BENEFIBER PO) Take by mouth.     No current facility-administered medications for this visit.    Family History  Problem Relation Age of Onset   Osteoporosis Mother    Atrial fibrillation Mother    Heart disease Mother    Stroke Mother    Depression Mother    Anxiety disorder Mother    Obesity Mother    Diabetes Father    Hypertension Father    Depression Father    Obesity Father    Breast cancer Maternal Grandmother    Cancer Maternal Grandmother        ovarian   Diabetes Paternal Grandmother    Migraines Brother        CHRONIC   Colon cancer Neg Hx    Esophageal cancer Neg Hx    Pancreatic cancer Neg Hx    Stomach cancer Neg Hx    Liver disease Neg Hx    Rectal cancer Neg Hx    Colon polyps Neg Hx     Review of Systems  All other systems reviewed and are negative.  Exam:   BP 110/70    Pulse 76    Ht 5' 5.5" (1.664 m)    Wt 194 lb (88 kg)    LMP 03/10/2013    SpO2 96%    BMI 31.79 kg/m     General appearance: alert, cooperative and appears stated age Head: normocephalic, without obvious abnormality, atraumatic Neck: no adenopathy, supple, symmetrical, trachea midline and thyroid normal to inspection and palpation  Lungs: clear to auscultation  bilaterally Breasts: normal appearance, no masses or tenderness, No nipple retraction or dimpling, No nipple discharge or bleeding, No axillary adenopathy Heart: regular rate and rhythm Abdomen: soft, non-tender; no masses, no organomegaly Extremities: extremities normal, atraumatic, no cyanosis or edema Skin: skin color, texture, turgor normal. No rashes or lesions Lymph nodes: cervical, supraclavicular, and axillary nodes normal. Neurologic: grossly normal  Pelvic: External genitalia:  no lesions              No abnormal inguinal nodes palpated.              Urethra:  normal appearing urethra with no masses, tenderness or lesions              Bartholins and Skenes: normal                 Vagina: normal appearing vagina with normal color and discharge, no lesions              Cervix: Nabothian cyst? with erythema at 2:00, 5 mm.               Pap taken: yes Bimanual Exam:  Uterus:  normal size, contour, position, consistency, mobility, non-tender              Adnexa: no mass, fullness, tenderness              Rectal exam: yes.  Confirms.              Anus:  normal sphincter tone, no lesions  Chaperone was present for exam: Estill Bamberg, CMA  Assessment:   Well woman visit with gynecologic exam. On PremPro.  Interstitial cystitis.  Osteopenia.  BMI 31.79.   Plan: Mammogram screening discussed. Self breast awareness reviewed. Pap and HR HPV collected. Guidelines for Calcium, Vitamin D, regular exercise program including cardiovascular and weight bearing exercise. Discused WHI and use of HRT which can increase risk of PE, DVT, MI, stroke and breast cancer.  I recommended discontinuation of HRT.  Bone density discussed.  No prescription medication is indicated.  Next BMD in 2024.  We reviewed weight loss medications that may be available through [facility].  Follow up annually and prn.   After visit summary provided.

## 2021-07-12 ENCOUNTER — Ambulatory Visit: Payer: BC Managed Care – PPO | Admitting: Nurse Practitioner

## 2021-07-12 ENCOUNTER — Other Ambulatory Visit: Payer: Self-pay

## 2021-07-12 ENCOUNTER — Other Ambulatory Visit (HOSPITAL_COMMUNITY)
Admission: RE | Admit: 2021-07-12 | Discharge: 2021-07-12 | Disposition: A | Discharge status: Home / Self Care | Payer: BC Managed Care – PPO | Source: Ambulatory Visit | Attending: Obstetrics and Gynecology | Admitting: Obstetrics and Gynecology

## 2021-07-12 ENCOUNTER — Encounter: Payer: Self-pay | Admitting: Obstetrics and Gynecology

## 2021-07-12 ENCOUNTER — Ambulatory Visit (INDEPENDENT_AMBULATORY_CARE_PROVIDER_SITE_OTHER): Payer: BC Managed Care – PPO | Admitting: Obstetrics and Gynecology

## 2021-07-12 ENCOUNTER — Other Ambulatory Visit: Payer: Self-pay | Admitting: Obstetrics and Gynecology

## 2021-07-12 VITALS — BP 110/70 | HR 76 | Ht 65.5 in | Wt 194.0 lb

## 2021-07-12 DIAGNOSIS — N889 Noninflammatory disorder of cervix uteri, unspecified: Secondary | ICD-10-CM | POA: Insufficient documentation

## 2021-07-12 DIAGNOSIS — Z1231 Encounter for screening mammogram for malignant neoplasm of breast: Secondary | ICD-10-CM

## 2021-07-12 DIAGNOSIS — Z01419 Encounter for gynecological examination (general) (routine) without abnormal findings: Secondary | ICD-10-CM

## 2021-07-12 NOTE — Patient Instructions (Signed)
EXERCISE AND DIET:  We recommended that you start or continue a regular exercise program for good health. Regular exercise means any activity that makes your heart beat faster and makes you sweat.  We recommend exercising at least 30 minutes per day at least 3 days a week, preferably 4 or 5.  We also recommend a diet low in fat and sugar.  Inactivity, poor dietary choices and obesity can cause diabetes, heart attack, stroke, and kidney damage, among others.    ALCOHOL AND SMOKING:  Women should limit their alcohol intake to no more than 7 drinks/beers/glasses of wine (combined, not each!) per week. Moderation of alcohol intake to this level decreases your risk of breast cancer and liver damage. And of course, no recreational drugs are part of a healthy lifestyle.  And absolutely no smoking or even second hand smoke. Most people know smoking can cause heart and lung diseases, but did you know it also contributes to weakening of your bones? Aging of your skin?  Yellowing of your teeth and nails?  CALCIUM AND VITAMIN D:  Adequate intake of calcium and Vitamin D are recommended.  The recommendations for exact amounts of these supplements seem to change often, but generally speaking 600 mg of calcium (either carbonate or citrate) and 800 units of Vitamin D per day seems prudent. Certain women may benefit from higher intake of Vitamin D.  If you are among these women, your doctor will have told you during your visit.    PAP SMEARS:  Pap smears, to check for cervical cancer or precancers,  have traditionally been done yearly, although recent scientific advances have shown that most women can have pap smears less often.  However, every woman still should have a physical exam from her gynecologist every year. It will include a breast check, inspection of the vulva and vagina to check for abnormal growths or skin changes, a visual exam of the cervix, and then an exam to evaluate the size and shape of the uterus and  ovaries.  And after 63 years of age, a rectal exam is indicated to check for rectal cancers. We will also provide age appropriate advice regarding health maintenance, like when you should have certain vaccines, screening for sexually transmitted diseases, bone density testing, colonoscopy, mammograms, etc.   MAMMOGRAMS:  All women over 57 years old should have a yearly mammogram. Many facilities now offer a "3D" mammogram, which may cost around $50 extra out of pocket. If possible,  we recommend you accept the option to have the 3D mammogram performed.  It both reduces the number of women who will be called back for extra views which then turn out to be normal, and it is better than the routine mammogram at detecting truly abnormal areas.    COLONOSCOPY:  Colonoscopy to screen for colon cancer is recommended for all women at age 51.  We know, you hate the idea of the prep.  We agree, BUT, having colon cancer and not knowing it is worse!!  Colon cancer so often starts as a polyp that can be seen and removed at colonscopy, which can quite literally save your life!  And if your first colonoscopy is normal and you have no family history of colon cancer, most women don't have to have it again for 10 years.  Once every ten years, you can do something that may end up saving your life, right?  We will be happy to help you get it scheduled when you are ready.  Be sure to check your insurance coverage so you understand how much it will cost.  It may be covered as a preventative service at no cost, but you should check your particular policy.    Calcium Content in Foods Calcium is the most abundant mineral in the body. Most of the body's calcium supply is stored in bones and teeth. Calcium helps many parts of the body function normally, including: Blood and blood vessels. Nerves. Hormones. Muscles. Bones and teeth. When your calcium stores are low, you may be at risk for low bone mass, bone loss, and broken bones  (fractures). When you get enough calcium, it helps to support strong bones and teeth throughout your life. Calcium is especially important for: Children during growth spurts. Girls during adolescence. Women who are pregnant or breastfeeding. Women after their menstrual cycle stops (postmenopause). Women whose menstrual cycle has stopped due to anorexia nervosa or regular intense exercise. People who cannot eat or digest dairy products. Vegans. Recommended daily amounts of calcium: Women (ages 48 to 3): 1,000 mg per day. Women (ages 75 and older): 1,200 mg per day. Men (ages 23 to 57): 1,000 mg per day. Men (ages 69 and older): 1,200 mg per day. Women (ages 45 to 40): 1,300 mg per day. Men (ages 56 to 42): 1,300 mg per day. General information Eat foods that are high in calcium. Try to get most of your calcium from food. Some people may benefit from taking calcium supplements. Check with your health care provider or diet and nutrition specialist (dietitian) before starting any calcium supplements. Calcium supplements may interact with certain medicines. Too much calcium may cause other health problems, such as constipation and kidney stones. For the body to absorb calcium, it needs vitamin D. Sources of vitamin D include: Skin exposure to direct sunlight. Foods, such as egg yolks, liver, mushrooms, saltwater fish, and fortified milk. Vitamin D supplements. Check with your health care provider or dietitian before starting any vitamin D supplements. What foods are high in calcium? Foods that are high in calcium contain more than 100 milligrams per serving. Fruits Fortified orange juice or other fruit juice, 300 mg per 8 oz serving. Vegetables Collard greens, 360 mg per 8 oz serving. Kale, 100 mg per 8 oz serving. Bok choy, 160 mg per 8 oz serving. Grains Fortified ready-to-eat cereals, 100 to 1,000 mg per 8 oz serving. Fortified frozen waffles, 200 mg in 2 waffles. Oatmeal, 140 mg in 1  cup. Meats and other proteins Sardines, canned with bones, 325 mg per 3 oz serving. Salmon, canned with bones, 180 mg per 3 oz serving. Canned shrimp, 125 mg per 3 oz serving. Baked beans, 160 mg per 4 oz serving. Tofu, firm, made with calcium sulfate, 253 mg per 4 oz serving. Dairy Yogurt, plain, low-fat, 310 mg per 6 oz serving. Nonfat milk, 300 mg per 8 oz serving. American cheese, 195 mg per 1 oz serving. Cheddar cheese, 205 mg per 1 oz serving. Cottage cheese 2%, 105 mg per 4 oz serving. Fortified soy, rice, or almond milk, 300 mg per 8 oz serving. Mozzarella, part skim, 210 mg per 1 oz serving. The items listed above may not be a complete list of foods high in calcium. Actual amounts of calcium may be different depending on processing. Contact a dietitian for more information. What foods are lower in calcium? Foods that are lower in calcium contain 50 mg or less per serving. Fruits Apple, about 6 mg. Banana, about 12 mg. Vegetables  Lettuce, 19 mg per 2 oz serving. Tomato, about 11 mg. Grains Rice, 4 mg per 6 oz serving. Boiled potatoes, 14 mg per 8 oz serving. White bread, 6 mg per slice. Meats and other proteins Egg, 27 mg per 2 oz serving. Red meat, 7 mg per 4 oz serving. Chicken, 17 mg per 4 oz serving. Fish, cod, or trout, 20 mg per 4 oz serving. Dairy Cream cheese, regular, 14 mg per 1 Tbsp serving. Brie cheese, 50 mg per 1 oz serving. Parmesan cheese, 70 mg per 1 Tbsp serving. The items listed above may not be a complete list of foods lower in calcium. Actual amounts of calcium may be different depending on processing. Contact a dietitian for more information. Summary Calcium is an important mineral in the body because it affects many functions. Getting enough calcium helps support strong bones and teeth throughout your life. Try to get most of your calcium from food. Calcium supplements may interact with certain medicines. Check with your health care provider or  dietitian before starting any calcium supplements. This information is not intended to replace advice given to you by your health care provider. Make sure you discuss any questions you have with your health care provider. Document Revised: 08/27/2019 Document Reviewed: 08/27/2019 Elsevier Patient Education  [place].

## 2021-07-13 ENCOUNTER — Other Ambulatory Visit: Payer: Self-pay | Admitting: Family Medicine

## 2021-07-13 DIAGNOSIS — E042 Nontoxic multinodular goiter: Secondary | ICD-10-CM

## 2021-07-14 LAB — CYTOLOGY - PAP
Comment: NEGATIVE
Diagnosis: NEGATIVE
High risk HPV: NEGATIVE

## 2021-07-21 ENCOUNTER — Other Ambulatory Visit: Payer: Self-pay | Admitting: Gastroenterology

## 2021-08-08 ENCOUNTER — Other Ambulatory Visit (HOSPITAL_COMMUNITY)
Admission: RE | Admit: 2021-08-08 | Discharge: 2021-08-08 | Disposition: A | Discharge status: Home / Self Care | Payer: BC Managed Care – PPO | Source: Ambulatory Visit | Attending: Interventional Radiology | Admitting: Interventional Radiology

## 2021-08-08 ENCOUNTER — Ambulatory Visit
Admission: RE | Admit: 2021-08-08 | Discharge: 2021-08-08 | Disposition: A | Discharge status: Home / Self Care | Payer: BC Managed Care – PPO | Source: Ambulatory Visit | Attending: Family Medicine | Admitting: Family Medicine

## 2021-08-08 DIAGNOSIS — E041 Nontoxic single thyroid nodule: Secondary | ICD-10-CM | POA: Insufficient documentation

## 2021-08-08 DIAGNOSIS — E042 Nontoxic multinodular goiter: Secondary | ICD-10-CM | POA: Diagnosis not present

## 2021-08-10 LAB — CYTOLOGY - NON PAP

## 2021-08-22 ENCOUNTER — Ambulatory Visit
Admission: RE | Admit: 2021-08-22 | Discharge: 2021-08-22 | Disposition: A | Discharge status: Home / Self Care | Payer: BC Managed Care – PPO | Source: Ambulatory Visit | Attending: Obstetrics and Gynecology | Admitting: Obstetrics and Gynecology

## 2021-08-22 DIAGNOSIS — Z1231 Encounter for screening mammogram for malignant neoplasm of breast: Secondary | ICD-10-CM | POA: Diagnosis not present

## 2021-08-24 ENCOUNTER — Other Ambulatory Visit: Payer: Self-pay | Admitting: Obstetrics and Gynecology

## 2021-08-24 DIAGNOSIS — H0231 Blepharochalasis right upper eyelid: Secondary | ICD-10-CM | POA: Diagnosis not present

## 2021-08-24 DIAGNOSIS — H0234 Blepharochalasis left upper eyelid: Secondary | ICD-10-CM | POA: Diagnosis not present

## 2021-08-24 DIAGNOSIS — D485 Neoplasm of uncertain behavior of skin: Secondary | ICD-10-CM | POA: Diagnosis not present

## 2021-08-24 DIAGNOSIS — H02423 Myogenic ptosis of bilateral eyelids: Secondary | ICD-10-CM | POA: Diagnosis not present

## 2021-08-24 DIAGNOSIS — R928 Other abnormal and inconclusive findings on diagnostic imaging of breast: Secondary | ICD-10-CM

## 2021-09-07 ENCOUNTER — Other Ambulatory Visit: Payer: Self-pay | Admitting: Obstetrics and Gynecology

## 2021-09-07 ENCOUNTER — Ambulatory Visit
Admission: RE | Admit: 2021-09-07 | Discharge: 2021-09-07 | Disposition: A | Discharge status: Home / Self Care | Payer: BC Managed Care – PPO | Source: Ambulatory Visit | Attending: Obstetrics and Gynecology | Admitting: Obstetrics and Gynecology

## 2021-09-07 DIAGNOSIS — N6489 Other specified disorders of breast: Secondary | ICD-10-CM

## 2021-09-07 DIAGNOSIS — R922 Inconclusive mammogram: Secondary | ICD-10-CM | POA: Diagnosis not present

## 2021-09-07 DIAGNOSIS — R928 Other abnormal and inconclusive findings on diagnostic imaging of breast: Secondary | ICD-10-CM

## 2021-09-14 ENCOUNTER — Encounter: Payer: Self-pay | Admitting: Psychiatry

## 2021-09-14 ENCOUNTER — Ambulatory Visit: Payer: BC Managed Care – PPO | Admitting: Psychiatry

## 2021-09-14 DIAGNOSIS — F3342 Major depressive disorder, recurrent, in full remission: Secondary | ICD-10-CM

## 2021-09-14 DIAGNOSIS — F5105 Insomnia due to other mental disorder: Secondary | ICD-10-CM | POA: Diagnosis not present

## 2021-09-14 DIAGNOSIS — F900 Attention-deficit hyperactivity disorder, predominantly inattentive type: Secondary | ICD-10-CM

## 2021-09-14 DIAGNOSIS — F515 Nightmare disorder: Secondary | ICD-10-CM | POA: Diagnosis not present

## 2021-09-14 DIAGNOSIS — F331 Major depressive disorder, recurrent, moderate: Secondary | ICD-10-CM | POA: Diagnosis not present

## 2021-09-14 DIAGNOSIS — F99 Mental disorder, not otherwise specified: Secondary | ICD-10-CM

## 2021-09-14 DIAGNOSIS — F411 Generalized anxiety disorder: Secondary | ICD-10-CM

## 2021-09-14 MED ORDER — SERTRALINE HCL 25 MG PO TABS: 37.5000 mg | ORAL_TABLET | Freq: Every day | ORAL | 0 refills | Status: DC

## 2021-09-14 MED ORDER — BUPROPION HCL ER (XL) 150 MG PO TB24: 150.0000 mg | ORAL_TABLET | Freq: Every day | ORAL | 1 refills | Status: DC

## 2021-09-14 MED ORDER — CLONAZEPAM 1 MG PO TABS: 1.0000 mg | ORAL_TABLET | Freq: Every day | ORAL | 1 refills | Status: DC

## 2021-09-14 MED ORDER — LAMOTRIGINE 100 MG PO TABS: 100.0000 mg | ORAL_TABLET | Freq: Every day | ORAL | 1 refills | Status: DC

## 2021-09-14 NOTE — Patient Instructions (Addendum)
Reduce sertraline to 1 daily for 2 weeks, then 1/2 daily for 2 weeks, then stop it. ?Start Wellbutrin XL 150 mg daily. ? ?
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
## 2021-10-03 DIAGNOSIS — L570 Actinic keratosis: Secondary | ICD-10-CM | POA: Diagnosis not present

## 2021-10-03 DIAGNOSIS — L738 Other specified follicular disorders: Secondary | ICD-10-CM | POA: Diagnosis not present

## 2021-10-03 DIAGNOSIS — L821 Other seborrheic keratosis: Secondary | ICD-10-CM | POA: Diagnosis not present

## 2021-10-03 DIAGNOSIS — L814 Other melanin hyperpigmentation: Secondary | ICD-10-CM | POA: Diagnosis not present

## 2021-10-06 DIAGNOSIS — D485 Neoplasm of uncertain behavior of skin: Secondary | ICD-10-CM | POA: Diagnosis not present

## 2021-10-06 DIAGNOSIS — D23122 Other benign neoplasm of skin of left lower eyelid, including canthus: Secondary | ICD-10-CM | POA: Diagnosis not present

## 2021-10-19 DIAGNOSIS — D485 Neoplasm of uncertain behavior of skin: Secondary | ICD-10-CM | POA: Diagnosis not present

## 2021-10-19 DIAGNOSIS — Z09 Encounter for follow-up examination after completed treatment for conditions other than malignant neoplasm: Secondary | ICD-10-CM | POA: Diagnosis not present

## 2021-10-31 ENCOUNTER — Other Ambulatory Visit: Payer: Self-pay

## 2021-10-31 DIAGNOSIS — F331 Major depressive disorder, recurrent, moderate: Secondary | ICD-10-CM

## 2021-10-31 MED ORDER — BUPROPION HCL ER (XL) 150 MG PO TB24: 150.0000 mg | ORAL_TABLET | Freq: Every day | ORAL | 1 refills | Status: DC

## 2021-11-02 DIAGNOSIS — J019 Acute sinusitis, unspecified: Secondary | ICD-10-CM | POA: Diagnosis not present

## 2021-11-02 DIAGNOSIS — H6503 Acute serous otitis media, bilateral: Secondary | ICD-10-CM | POA: Diagnosis not present

## 2021-11-16 ENCOUNTER — Ambulatory Visit: Payer: BC Managed Care – PPO | Admitting: Psychiatry

## 2021-11-16 DIAGNOSIS — G43709 Chronic migraine without aura, not intractable, without status migrainosus: Secondary | ICD-10-CM | POA: Diagnosis not present

## 2021-11-21 ENCOUNTER — Ambulatory Visit: Payer: BC Managed Care – PPO | Admitting: Psychiatry

## 2021-11-22 DIAGNOSIS — H65193 Other acute nonsuppurative otitis media, bilateral: Secondary | ICD-10-CM | POA: Diagnosis not present

## 2021-11-22 DIAGNOSIS — R0981 Nasal congestion: Secondary | ICD-10-CM | POA: Diagnosis not present

## 2021-11-23 ENCOUNTER — Ambulatory Visit (HOSPITAL_BASED_OUTPATIENT_CLINIC_OR_DEPARTMENT_OTHER)
Admission: RE | Admit: 2021-11-23 | Discharge: 2021-11-23 | Disposition: A | Discharge status: Home / Self Care | Payer: BC Managed Care – PPO | Source: Ambulatory Visit | Attending: Family Medicine | Admitting: Family Medicine

## 2021-11-23 DIAGNOSIS — R918 Other nonspecific abnormal finding of lung field: Secondary | ICD-10-CM | POA: Insufficient documentation

## 2021-11-28 ENCOUNTER — Other Ambulatory Visit: Payer: Self-pay

## 2021-11-28 DIAGNOSIS — F3342 Major depressive disorder, recurrent, in full remission: Secondary | ICD-10-CM

## 2021-11-28 DIAGNOSIS — F411 Generalized anxiety disorder: Secondary | ICD-10-CM

## 2021-11-28 MED ORDER — SERTRALINE HCL 25 MG PO TABS: 37.5000 mg | ORAL_TABLET | Freq: Every day | ORAL | 0 refills | Status: DC

## 2021-12-07 ENCOUNTER — Other Ambulatory Visit: Payer: Self-pay | Admitting: Psychiatry

## 2021-12-07 ENCOUNTER — Telehealth: Payer: Self-pay | Admitting: Psychiatry

## 2021-12-07 MED ORDER — SERTRALINE HCL 20 MG/ML PO CONC: ORAL | 1 refills | Status: DC

## 2021-12-07 NOTE — Telephone Encounter (Signed)
Pt has been off of the zoloft for 3 weeks.She has been having brain zaps and dizziness and wants to know how long will it last.She also wants to know if she should restart it for the time being

## 2021-12-07 NOTE — Telephone Encounter (Signed)
Pt lvm at 3:26 pm stating that she has questions about weaning off the medicine. She said that she is experiencing dizziness and feels like she needs to not wean off of it. Please call her and tell her what to do. [phone]

## 2021-12-07 NOTE — Telephone Encounter (Signed)
If she is still having SSRI withdrawal after 3 weeks off Zoloft, she should taper it more slowly.  There is no smaller tablet so will have to taper with liquid so she can taper with lower dosages.  I will send in RX of the liquid with instructions. Start it right away and the brain zaps will resolve the first day.

## 2021-12-08 ENCOUNTER — Other Ambulatory Visit: Payer: Self-pay

## 2021-12-08 DIAGNOSIS — K7689 Other specified diseases of liver: Secondary | ICD-10-CM

## 2021-12-08 DIAGNOSIS — K769 Liver disease, unspecified: Secondary | ICD-10-CM

## 2021-12-08 NOTE — Telephone Encounter (Signed)
Patient notified of Rx and recommendations.

## 2021-12-09 ENCOUNTER — Other Ambulatory Visit: Payer: Self-pay

## 2021-12-09 DIAGNOSIS — R918 Other nonspecific abnormal finding of lung field: Secondary | ICD-10-CM

## 2021-12-21 ENCOUNTER — Encounter: Payer: Self-pay | Admitting: Psychiatry

## 2021-12-21 ENCOUNTER — Ambulatory Visit (INDEPENDENT_AMBULATORY_CARE_PROVIDER_SITE_OTHER): Payer: BC Managed Care – PPO | Admitting: Psychiatry

## 2021-12-21 ENCOUNTER — Encounter (INDEPENDENT_AMBULATORY_CARE_PROVIDER_SITE_OTHER): Payer: Self-pay

## 2021-12-21 DIAGNOSIS — F5105 Insomnia due to other mental disorder: Secondary | ICD-10-CM

## 2021-12-21 DIAGNOSIS — F411 Generalized anxiety disorder: Secondary | ICD-10-CM | POA: Diagnosis not present

## 2021-12-21 DIAGNOSIS — M797 Fibromyalgia: Secondary | ICD-10-CM

## 2021-12-21 DIAGNOSIS — F331 Major depressive disorder, recurrent, moderate: Secondary | ICD-10-CM

## 2021-12-21 DIAGNOSIS — F99 Mental disorder, not otherwise specified: Secondary | ICD-10-CM

## 2021-12-21 DIAGNOSIS — F3342 Major depressive disorder, recurrent, in full remission: Secondary | ICD-10-CM

## 2021-12-21 DIAGNOSIS — F900 Attention-deficit hyperactivity disorder, predominantly inattentive type: Secondary | ICD-10-CM

## 2021-12-21 DIAGNOSIS — F515 Nightmare disorder: Secondary | ICD-10-CM

## 2021-12-21 MED ORDER — CLONIDINE HCL 0.1 MG PO TABS: ORAL_TABLET | ORAL | 0 refills | Status: DC

## 2021-12-21 MED ORDER — SERTRALINE HCL 25 MG PO TABS: 37.5000 mg | ORAL_TABLET | Freq: Every day | ORAL | 0 refills | Status: DC

## 2021-12-21 MED ORDER — TIZANIDINE HCL 4 MG PO TABS: 4.0000 mg | ORAL_TABLET | Freq: Every day | ORAL | 1 refills | Status: AC

## 2021-12-21 MED ORDER — LAMOTRIGINE 100 MG PO TABS: 100.0000 mg | ORAL_TABLET | Freq: Every day | ORAL | 1 refills | Status: DC

## 2021-12-21 MED ORDER — BUPROPION HCL ER (XL) 150 MG PO TB24: 150.0000 mg | ORAL_TABLET | Freq: Every day | ORAL | 0 refills | Status: DC

## 2021-12-21 NOTE — Patient Instructions (Signed)
Alvester Chou PhD therapist
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
## 2021-12-23 ENCOUNTER — Ambulatory Visit (HOSPITAL_BASED_OUTPATIENT_CLINIC_OR_DEPARTMENT_OTHER)
Admission: RE | Admit: 2021-12-23 | Discharge: 2021-12-23 | Disposition: A | Discharge status: Home / Self Care | Payer: BC Managed Care – PPO | Source: Ambulatory Visit | Attending: Gastroenterology | Admitting: Gastroenterology

## 2021-12-23 ENCOUNTER — Encounter (HOSPITAL_BASED_OUTPATIENT_CLINIC_OR_DEPARTMENT_OTHER): Payer: Self-pay

## 2021-12-23 DIAGNOSIS — K7689 Other specified diseases of liver: Secondary | ICD-10-CM

## 2021-12-23 DIAGNOSIS — K769 Liver disease, unspecified: Secondary | ICD-10-CM

## 2021-12-26 DIAGNOSIS — H9202 Otalgia, left ear: Secondary | ICD-10-CM | POA: Diagnosis not present

## 2021-12-26 DIAGNOSIS — H9192 Unspecified hearing loss, left ear: Secondary | ICD-10-CM | POA: Diagnosis not present

## 2021-12-26 DIAGNOSIS — M7051 Other bursitis of knee, right knee: Secondary | ICD-10-CM | POA: Diagnosis not present

## 2021-12-30 ENCOUNTER — Emergency Department (HOSPITAL_BASED_OUTPATIENT_CLINIC_OR_DEPARTMENT_OTHER): Payer: BC Managed Care – PPO

## 2021-12-30 ENCOUNTER — Emergency Department (HOSPITAL_BASED_OUTPATIENT_CLINIC_OR_DEPARTMENT_OTHER)
Admission: EM | Admit: 2021-12-30 | Discharge: 2021-12-30 | Disposition: A | Discharge status: Home / Self Care | Payer: BC Managed Care – PPO | Attending: Emergency Medicine | Admitting: Emergency Medicine

## 2021-12-30 ENCOUNTER — Other Ambulatory Visit: Payer: Self-pay

## 2021-12-30 ENCOUNTER — Encounter (HOSPITAL_BASED_OUTPATIENT_CLINIC_OR_DEPARTMENT_OTHER): Payer: Self-pay

## 2021-12-30 DIAGNOSIS — N200 Calculus of kidney: Secondary | ICD-10-CM | POA: Diagnosis not present

## 2021-12-30 DIAGNOSIS — K5792 Diverticulitis of intestine, part unspecified, without perforation or abscess without bleeding: Secondary | ICD-10-CM | POA: Insufficient documentation

## 2021-12-30 DIAGNOSIS — M545 Low back pain, unspecified: Secondary | ICD-10-CM | POA: Insufficient documentation

## 2021-12-30 DIAGNOSIS — R1030 Lower abdominal pain, unspecified: Secondary | ICD-10-CM | POA: Diagnosis not present

## 2021-12-30 DIAGNOSIS — D72829 Elevated white blood cell count, unspecified: Secondary | ICD-10-CM | POA: Insufficient documentation

## 2021-12-30 DIAGNOSIS — K5732 Diverticulitis of large intestine without perforation or abscess without bleeding: Secondary | ICD-10-CM | POA: Diagnosis not present

## 2021-12-30 DIAGNOSIS — K5730 Diverticulosis of large intestine without perforation or abscess without bleeding: Secondary | ICD-10-CM | POA: Diagnosis not present

## 2021-12-30 DIAGNOSIS — R197 Diarrhea, unspecified: Secondary | ICD-10-CM | POA: Diagnosis not present

## 2021-12-30 LAB — URINALYSIS, ROUTINE W REFLEX MICROSCOPIC
Glucose, UA: NEGATIVE mg/dL
Ketones, ur: NEGATIVE mg/dL
Leukocytes,Ua: NEGATIVE
Nitrite: POSITIVE — AB
Protein, ur: NEGATIVE mg/dL
Specific Gravity, Urine: 1.009 (ref 1.005–1.030)
pH: 5.5 (ref 5.0–8.0)

## 2021-12-30 LAB — COMPREHENSIVE METABOLIC PANEL
ALT: 15 U/L (ref 0–44)
AST: 15 U/L (ref 15–41)
Albumin: 4.2 g/dL (ref 3.5–5.0)
Alkaline Phosphatase: 76 U/L (ref 38–126)
Anion gap: 11 (ref 5–15)
BUN: 15 mg/dL (ref 8–23)
CO2: 22 mmol/L (ref 22–32)
Calcium: 9 mg/dL (ref 8.9–10.3)
Chloride: 105 mmol/L (ref 98–111)
Creatinine, Ser: 1.03 mg/dL — ABNORMAL HIGH (ref 0.44–1.00)
GFR, Estimated: >60 mL/min (ref >60)
Glucose, Bld: 99 mg/dL (ref 70–99)
Potassium: 3.9 mmol/L (ref 3.5–5.1)
Sodium: 138 mmol/L (ref 135–145)
Total Bilirubin: 0.5 mg/dL (ref 0.3–1.2)
Total Protein: 7.5 g/dL (ref 6.5–8.1)

## 2021-12-30 LAB — CBC
HCT: 40.7 % (ref 36.0–46.0)
Hemoglobin: 13.6 g/dL (ref 12.0–15.0)
MCH: 28.2 pg (ref 26.0–34.0)
MCHC: 33.4 g/dL (ref 30.0–36.0)
MCV: 84.4 fL (ref 80.0–100.0)
Platelets: 260 10*3/uL (ref 150–400)
RBC: 4.82 MIL/uL (ref 3.87–5.11)
RDW: 13 % (ref 11.5–15.5)
WBC: 11.8 10*3/uL — ABNORMAL HIGH (ref 4.0–10.5)
nRBC: 0 % (ref 0.0–0.2)

## 2021-12-30 LAB — LIPASE, BLOOD: Lipase: 27 U/L (ref 11–51)

## 2021-12-30 MED ORDER — OXYCODONE HCL 5 MG PO TABS: 5.0000 mg | ORAL_TABLET | ORAL | 0 refills | Status: DC | PRN

## 2021-12-30 MED ORDER — ONDANSETRON 4 MG PO TBDP: 4.0000 mg | ORAL_TABLET | Freq: Three times a day (TID) | ORAL | 0 refills | Status: DC | PRN

## 2021-12-30 MED ORDER — ONDANSETRON HCL 4 MG/2ML IJ SOLN
4.0000 mg | Freq: Once | INTRAMUSCULAR | Status: AC
  Administered 2021-12-30: 4 mg via INTRAVENOUS
  Filled 2021-12-30: qty 2

## 2021-12-30 MED ORDER — METRONIDAZOLE 500 MG PO TABS: 500.0000 mg | ORAL_TABLET | Freq: Two times a day (BID) | ORAL | 0 refills | Status: AC

## 2021-12-30 MED ORDER — DICYCLOMINE HCL 20 MG PO TABS: 20.0000 mg | ORAL_TABLET | Freq: Two times a day (BID) | ORAL | 0 refills | Status: DC | PRN

## 2021-12-30 MED ORDER — MORPHINE SULFATE (PF) 4 MG/ML IV SOLN
4.0000 mg | Freq: Once | INTRAVENOUS | Status: AC
  Administered 2021-12-30: 4 mg via INTRAVENOUS
  Filled 2021-12-30: qty 1

## 2021-12-30 MED ORDER — MORPHINE SULFATE (PF) 2 MG/ML IV SOLN
2.0000 mg | Freq: Once | INTRAVENOUS | Status: AC
  Administered 2021-12-30: 2 mg via INTRAVENOUS
  Filled 2021-12-30: qty 1

## 2021-12-30 MED ORDER — LACTATED RINGERS IV BOLUS
500.0000 mL | Freq: Once | INTRAVENOUS | Status: AC
  Administered 2021-12-30: 500 mL via INTRAVENOUS

## 2021-12-30 MED ORDER — CIPROFLOXACIN HCL 500 MG PO TABS: 500.0000 mg | ORAL_TABLET | Freq: Two times a day (BID) | ORAL | 0 refills | Status: DC

## 2021-12-30 MED ORDER — IOHEXOL 300 MG/ML  SOLN
100.0000 mL | Freq: Once | INTRAMUSCULAR | Status: AC | PRN
Start: 2021-12-30 — End: 2021-12-30
  Administered 2021-12-30: 100 mL via INTRAVENOUS

## 2021-12-30 NOTE — ED Notes (Signed)
Patient transported to CT 

## 2021-12-30 NOTE — ED Notes (Signed)
Pt given Toradol at [facility] pt stated that she is feeling better

## 2021-12-30 NOTE — ED Provider Notes (Signed)
[facility] EMERGENCY DEPT Provider Note   CSN: [id number] Arrival date & time: 12/30/21  3664     History  Chief Complaint  Patient presents with   Abdominal Pain    Marisa Hamilton is a 62 y.o. female.  HPI       Lower abdominal and back pain Pain started yesterday, like a very strong dull ache Pain is severe, 7/10, improved after toradol shot with urgente care Both sides Some diarrhea but that is not unusual  No known fever, no dysuria.  Past Medical History:  Diagnosis Date   Anxiety    Back pain    CFS (chronic fatigue syndrome)    Concussion 03-17-17   Constipation    Cystitis, interstitial    HAD BLADDER SX FOR SAME   Depression    Fainting spell    Fibromyalgia    Food allergy    Foot pain    GERD (gastroesophageal reflux disease)    Headache(784.0)    IBS (irritable bowel syndrome)    TAKES PROBIOTICS   Insomnia    Lactose intolerance    Leg pain    Migraine    MVP (mitral valve prolapse)    Nausea    Neck pain    PONV (postoperative nausea and vomiting)    Restless leg syndrome    Stomach ache    Ulcerative colitis ([place])    Varicose veins      Home Medications Prior to Admission medications   Medication Sig Start Date End Date Taking? Authorizing Provider  ciprofloxacin (CIPRO) 500 MG tablet Take 1 tablet (500 mg total) by mouth every 12 (twelve) hours. 12/30/21  Yes Gareth Morgan, MD  dicyclomine (BENTYL) 20 MG tablet Take 1 tablet (20 mg total) by mouth 2 (two) times daily as needed for spasms (abd pain). 12/30/21  Yes Gareth Morgan, MD  metroNIDAZOLE (FLAGYL) 500 MG tablet Take 1 tablet (500 mg total) by mouth 2 (two) times daily for 10 days. 12/30/21 01/09/22 Yes Gareth Morgan, MD  ondansetron (ZOFRAN-ODT) 4 MG disintegrating tablet Take 1 tablet (4 mg total) by mouth every 8 (eight) hours as needed for nausea or vomiting. 12/30/21  Yes Gareth Morgan, MD  oxyCODONE (ROXICODONE) 5 MG immediate release tablet  Take 1 tablet (5 mg total) by mouth every 4 (four) hours as needed for severe pain. 12/30/21  Yes Gareth Morgan, MD  buPROPion (WELLBUTRIN XL) 150 MG 24 hr tablet Take 1 tablet (150 mg total) by mouth daily. 12/21/21   Cottle, Billey Co., MD  chlorproMAZINE (THORAZINE) 25 MG tablet TAKE 1 TABLET BY MOUTH 3 TIMES DAILY AS NEEDED FOR HEADACHE RESCUE 07/19/20   [provider]  clonazePAM (KLONOPIN) 1 MG tablet Take 1 tablet (1 mg total) by mouth at bedtime. 09/14/21   Cottle, Billey Co., MD  cloNIDine (CATAPRES) 0.1 MG tablet 1 daily prn anxiety 12/21/21   Cottle, Billey Co., MD  famotidine (PEPCID) 20 MG tablet Take 20 mg by mouth daily as needed for heartburn or indigestion.    [provider]  ketorolac (TORADOL) 60 MG/2ML SOLN injection Inject 60 mg into the muscle as needed (migraines).  11/06/19   [provider]  lamoTRIgine (LAMICTAL) 100 MG tablet Take 1 tablet (100 mg total) by mouth daily. 12/21/21   Cottle, Billey Co., MD  Lasmiditan Succinate (REYVOW) 100 MG TABS Take 100 mg by mouth as needed.    [provider]  loratadine (CLARITIN) 10 MG tablet Take 10  mg by mouth daily.    [provider]  modafinil (PROVIGIL) 100 MG tablet Take 1 tablet (100 mg total) by mouth daily. Patient not taking: Reported on 12/21/2021 02/17/21   Purnell Shoemaker., MD  omeprazole (PRILOSEC) 40 MG capsule TAKE 1 CAPSULE BY MOUTH EVERY DAY 06/01/21   Nandigam, Venia Minks, MD  ondansetron (ZOFRAN) 4 MG tablet TAKE 1 TABLET BY MOUTH EVERY 8 HOURS AS NEEDED FOR NAUSEA AND VOMITING 07/21/21   Nandigam, Venia Minks, MD  PREMPRO 0.625-2.5 MG tablet Take 1 tablet by mouth daily. 06/01/21   [provider]  Rimegepant Sulfate (NURTEC) 75 MG TBDP Take by mouth.    [provider]  rizatriptan (MAXALT) 10 MG tablet Take by mouth. 05/17/21   [provider]  sertraline (ZOLOFT) 25 MG tablet Take 1.5 tablets (37.5 mg total) by mouth daily. 12/21/21   Cottle, Billey Co., MD  simethicone (MYLICON) 329 MG chewable tablet Chew 125 mg by mouth every 6 (six) hours as needed for flatulence.    [provider]  tiZANidine (ZANAFLEX) 4 MG tablet Take 1 tablet (4 mg total) by mouth at bedtime. 12/21/21   Cottle, Billey Co., MD  topiramate (TOPAMAX) 50 MG tablet Take 1 tablet (50 mg total) by mouth 2 (two) times daily. Patient taking differently: Take 50 mg by mouth 2 (two) times daily. Once daily 01/25/20   Briscoe Deutscher, DO  traZODone (DESYREL) 100 MG tablet Take 3 tablets (300 mg total) by mouth at bedtime. HOLD THIS UNTIL SHE CALLS 06/15/21   Cottle, Billey Co., MD  Wheat Dextrin (BENEFIBER PO) Take by mouth.    [provider]      Allergies    Selenium dioxide [selenium], Gluten meal, Milk (cow), Nsaids, Phentermine, Amitriptyline hcl, Lactase-lactobacillus, Meloxicam, and Milk-related compounds    Review of Systems   Review of Systems  Physical Exam Updated Vital Signs BP 128/75 (BP Location: Right Arm)   Pulse 74   Temp 98.1 F (36.7 C) (Oral)   Resp 18   Ht 5' 5"  (1.651 m)   Wt 88 kg   LMP 07/29/2012 Comment: spotting that week  SpO2 98%   BMI 32.28 kg/m  Physical Exam Vitals and nursing note reviewed.  Constitutional:      General: She is not in acute distress.    Appearance: She is well-developed. She is not diaphoretic.  HENT:     Head: Normocephalic and atraumatic.  Eyes:     Conjunctiva/sclera: Conjunctivae normal.  Cardiovascular:     Rate and Rhythm: Normal rate and regular rhythm.     Heart sounds: Normal heart sounds. No murmur heard.    No friction rub. No gallop.  Pulmonary:     Effort: Pulmonary effort is normal. No respiratory distress.     Breath sounds: Normal breath sounds. No wheezing or rales.  Abdominal:     General: There is no distension.     Palpations: Abdomen is soft.     Tenderness: There is abdominal tenderness in the suprapubic area and left lower quadrant. There is right CVA tenderness and  left CVA tenderness. There is no guarding.  Musculoskeletal:        General: No tenderness.     Cervical back: Normal range of motion.  Skin:    General: Skin is warm and dry.     Findings: No erythema or rash.  Neurological:     Mental Status: She is alert and oriented to  person, place, and time.     ED Results / Procedures / Treatments   Labs (all labs ordered are listed, but only abnormal results are displayed) Labs Reviewed  COMPREHENSIVE METABOLIC PANEL - Abnormal; Notable for the following components:      Result Value   Creatinine, Ser 1.03 (*)    All other components within normal limits  CBC - Abnormal; Notable for the following components:   WBC 11.8 (*)    All other components within normal limits  URINALYSIS, ROUTINE W REFLEX MICROSCOPIC - Abnormal; Notable for the following components:   Color, Urine ORANGE (*)    Hgb urine dipstick TRACE (*)    Bilirubin Urine SMALL (*)    Nitrite POSITIVE (*)    Bacteria, UA FEW (*)    All other components within normal limits  LIPASE, BLOOD    EKG None  Radiology CT ABDOMEN PELVIS W CONTRAST  Result Date: 12/30/2021 CLINICAL DATA:  Left flank and bilateral lower abdominal pain since yesterday. Diarrhea. EXAM: CT ABDOMEN AND PELVIS WITH CONTRAST TECHNIQUE: Multidetector CT imaging of the abdomen and pelvis was performed using the standard protocol following bolus administration of intravenous contrast. RADIATION DOSE REDUCTION: This exam was performed according to the departmental dose-optimization program which includes automated exposure control, adjustment of the mA and/or kV according to patient size and/or use of iterative reconstruction technique. CONTRAST:  158m OMNIPAQUE IOHEXOL 300 MG/ML  SOLN COMPARISON:  Chest CT dated 11/23/2021. Abdomen and pelvis CT dated 10/25/2015. FINDINGS: Lower chest: Normal sized heart. Minimal bilateral dependent atelectasis. The small right lower lobe nodular density seen on 10/25/2015 is  no longer visualized. The right lower lobe nodular density seen on 05/27/2021 is also no longer visualized. Currently, no lung nodules are seen. Hepatobiliary: A lateral segment left lobe liver cyst is unchanged. Normal appearing gallbladder. Pancreas: Unremarkable. No pancreatic ductal dilatation or surrounding inflammatory changes. Spleen: Normal in size without focal abnormality. Adrenals/Urinary Tract: Normal appearing adrenal glands. 2 mm lower pole left renal calculus. Small simple appearing left renal cyst. This does not need follow-up. Normal appearing right kidney, ureters and urinary bladder. Stomach/Bowel: Multiple sigmoid and descending colon diverticula. Diffuse low density wall thickening and pericolonic soft tissue stranding and edema at the level of the proximal sigmoid colon. This is in an area of multiple diverticula. No fluid collection or extraluminal gas seen. Unremarkable stomach, small bowel and appendix. Vascular/Lymphatic: No significant vascular findings are present. No enlarged abdominal or pelvic lymph nodes. Reproductive: Uterus and bilateral adnexa are unremarkable. Other: Small to moderate-sized bilateral inguinal hernias containing fat. Musculoskeletal: Mild lumbar and lower thoracic spine degenerative changes. IMPRESSION: 1. Proximal sigmoid colon diverticulitis without abscess. 2. Sigmoid and descending colon diverticulosis. 3. Mm nonobstructing lower pole left renal calculus. 4. Stable simple appearing liver cyst. 5. The previously seen right lower lobe nodular densities are no longer present. Electronically Signed   By: SClaudie ReveringM.D.   On: 12/30/2021 11:41    Procedures Procedures    Medications Ordered in ED Medications  lactated ringers bolus 500 mL (0 mLs Intravenous Stopped 12/30/21 1103)  morphine (PF) 4 MG/ML injection 4 mg (4 mg Intravenous Given 12/30/21 1106)  ondansetron (ZOFRAN) injection 4 mg (4 mg Intravenous Given 12/30/21 1104)  iohexol (OMNIPAQUE) 300  MG/ML solution 100 mL (100 mLs Intravenous Contrast Given 12/30/21 1124)  morphine (PF) 2 MG/ML injection 2 mg (2 mg Intravenous Given 12/30/21 1339)    ED Course/ Medical Decision Making/ A&P  Medical Decision Making Amount and/or Complexity of Data Reviewed Labs: ordered. Radiology: ordered.  Risk Prescription drug management.   61yo female with history of fibromyalgia, possible UC, hx prior diverticulitis who presents with concern for abdominal and back pain.    DDx includes appendicitis, pancreatitis, cholecystitis, pyelonephritis, nephrolithiasis, diverticulitis, SBO, AAA.  Labs completed and personally evaluated and interpreted by me show no pancreatitis, no hepatitis, no significant electrolyte abnormalities, and show mild leukocytosis.   CT completed shows diverticulitis of proximal sigmoid colon, nonobstructing left renal calculus, stable liver cyst.   Given rx for cipro/flagyl ,bentyl, oxycodone (after review in [facility] drug database, discussion of risks), zofran. Discussed return precautions. Patient discharged in stable condition with understanding of reasons to return.          Final Clinical Impression(s) / ED Diagnoses Final diagnoses:  Diverticulitis    Rx / DC Orders ED Discharge Orders          Ordered    ciprofloxacin (CIPRO) 500 MG tablet  Every 12 hours        12/30/21 1328    metroNIDAZOLE (FLAGYL) 500 MG tablet  2 times daily        12/30/21 1328    oxyCODONE (ROXICODONE) 5 MG immediate release tablet  Every 4 hours PRN        12/30/21 1328    ondansetron (ZOFRAN-ODT) 4 MG disintegrating tablet  Every 8 hours PRN        12/30/21 1328    dicyclomine (BENTYL) 20 MG tablet  2 times daily PRN        12/30/21 1328              Gareth Morgan, MD 12/30/21 2154

## 2021-12-30 NOTE — ED Triage Notes (Signed)
T. States she went to walk in clinic this am for abdominal pain and back pain. States pain started yesterday. States started in back and radiates towards abdominal area. Rates pain 7/10. States has diarrhea, but no nausea or vomiting.

## 2022-01-07 DIAGNOSIS — R3915 Urgency of urination: Secondary | ICD-10-CM | POA: Diagnosis not present

## 2022-01-27 ENCOUNTER — Ambulatory Visit (HOSPITAL_BASED_OUTPATIENT_CLINIC_OR_DEPARTMENT_OTHER)
Admission: RE | Admit: 2022-01-27 | Discharge: 2022-01-27 | Disposition: A | Discharge status: Home / Self Care | Payer: BC Managed Care – PPO | Source: Ambulatory Visit | Attending: Gastroenterology | Admitting: Gastroenterology

## 2022-01-27 DIAGNOSIS — K760 Fatty (change of) liver, not elsewhere classified: Secondary | ICD-10-CM | POA: Insufficient documentation

## 2022-01-27 DIAGNOSIS — R140 Abdominal distension (gaseous): Secondary | ICD-10-CM | POA: Diagnosis not present

## 2022-01-27 DIAGNOSIS — N289 Disorder of kidney and ureter, unspecified: Secondary | ICD-10-CM | POA: Diagnosis not present

## 2022-01-27 DIAGNOSIS — K7689 Other specified diseases of liver: Secondary | ICD-10-CM | POA: Diagnosis not present

## 2022-01-27 MED ORDER — GADOBUTROL 1 MMOL/ML IV SOLN
8.8000 mL | Freq: Once | INTRAVENOUS | Status: AC | PRN
  Administered 2022-01-27: 8.8 mL via INTRAVENOUS
  Filled 2022-01-27: qty 10

## 2022-02-17 DIAGNOSIS — F4389 Other reactions to severe stress: Secondary | ICD-10-CM | POA: Diagnosis not present

## 2022-02-22 DIAGNOSIS — F411 Generalized anxiety disorder: Secondary | ICD-10-CM | POA: Diagnosis not present

## 2022-02-22 DIAGNOSIS — F4389 Other reactions to severe stress: Secondary | ICD-10-CM | POA: Diagnosis not present

## 2022-02-22 DIAGNOSIS — F332 Major depressive disorder, recurrent severe without psychotic features: Secondary | ICD-10-CM | POA: Diagnosis not present

## 2022-03-08 DIAGNOSIS — F332 Major depressive disorder, recurrent severe without psychotic features: Secondary | ICD-10-CM | POA: Diagnosis not present

## 2022-03-08 DIAGNOSIS — F4389 Other reactions to severe stress: Secondary | ICD-10-CM | POA: Diagnosis not present

## 2022-03-08 DIAGNOSIS — F411 Generalized anxiety disorder: Secondary | ICD-10-CM | POA: Diagnosis not present

## 2022-03-10 ENCOUNTER — Other Ambulatory Visit: Payer: Self-pay | Admitting: Obstetrics and Gynecology

## 2022-03-10 ENCOUNTER — Ambulatory Visit: Admission: RE | Admit: 2022-03-10 | Payer: BC Managed Care – PPO | Source: Ambulatory Visit

## 2022-03-10 ENCOUNTER — Ambulatory Visit
Admission: RE | Admit: 2022-03-10 | Discharge: 2022-03-10 | Disposition: A | Discharge status: Home / Self Care | Payer: BC Managed Care – PPO | Source: Ambulatory Visit | Attending: Obstetrics and Gynecology | Admitting: Obstetrics and Gynecology

## 2022-03-10 DIAGNOSIS — R519 Headache, unspecified: Secondary | ICD-10-CM | POA: Diagnosis not present

## 2022-03-10 DIAGNOSIS — K219 Gastro-esophageal reflux disease without esophagitis: Secondary | ICD-10-CM | POA: Diagnosis not present

## 2022-03-10 DIAGNOSIS — J309 Allergic rhinitis, unspecified: Secondary | ICD-10-CM | POA: Diagnosis not present

## 2022-03-10 DIAGNOSIS — N6489 Other specified disorders of breast: Secondary | ICD-10-CM

## 2022-03-10 DIAGNOSIS — H6993 Unspecified Eustachian tube disorder, bilateral: Secondary | ICD-10-CM | POA: Diagnosis not present

## 2022-03-10 DIAGNOSIS — R92332 Mammographic heterogeneous density, left breast: Secondary | ICD-10-CM | POA: Diagnosis not present

## 2022-03-20 ENCOUNTER — Encounter: Payer: Self-pay | Admitting: Psychiatry

## 2022-03-20 ENCOUNTER — Ambulatory Visit (INDEPENDENT_AMBULATORY_CARE_PROVIDER_SITE_OTHER): Payer: BC Managed Care – PPO | Admitting: Psychiatry

## 2022-03-20 DIAGNOSIS — F900 Attention-deficit hyperactivity disorder, predominantly inattentive type: Secondary | ICD-10-CM | POA: Diagnosis not present

## 2022-03-20 DIAGNOSIS — F515 Nightmare disorder: Secondary | ICD-10-CM

## 2022-03-20 DIAGNOSIS — F331 Major depressive disorder, recurrent, moderate: Secondary | ICD-10-CM | POA: Diagnosis not present

## 2022-03-20 DIAGNOSIS — F411 Generalized anxiety disorder: Secondary | ICD-10-CM | POA: Diagnosis not present

## 2022-03-20 DIAGNOSIS — F5105 Insomnia due to other mental disorder: Secondary | ICD-10-CM

## 2022-03-20 DIAGNOSIS — F99 Mental disorder, not otherwise specified: Secondary | ICD-10-CM

## 2022-03-20 MED ORDER — TRAZODONE HCL 100 MG PO TABS: 100.0000 mg | ORAL_TABLET | Freq: Every day | ORAL | 0 refills | Status: DC

## 2022-03-20 MED ORDER — CLONAZEPAM 1 MG PO TABS: 1.0000 mg | ORAL_TABLET | Freq: Every day | ORAL | 1 refills | Status: DC

## 2022-03-20 MED ORDER — SERTRALINE HCL 25 MG PO TABS: 37.5000 mg | ORAL_TABLET | Freq: Every day | ORAL | 0 refills | Status: DC
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
## 2022-03-21 ENCOUNTER — Other Ambulatory Visit: Payer: Self-pay

## 2022-03-21 DIAGNOSIS — F331 Major depressive disorder, recurrent, moderate: Secondary | ICD-10-CM

## 2022-03-21 MED ORDER — BUPROPION HCL ER (XL) 150 MG PO TB24: 150.0000 mg | ORAL_TABLET | Freq: Every day | ORAL | 0 refills | Status: DC

## 2022-03-24 DIAGNOSIS — F4389 Other reactions to severe stress: Secondary | ICD-10-CM | POA: Diagnosis not present

## 2022-03-24 DIAGNOSIS — F332 Major depressive disorder, recurrent severe without psychotic features: Secondary | ICD-10-CM | POA: Diagnosis not present

## 2022-03-24 DIAGNOSIS — F411 Generalized anxiety disorder: Secondary | ICD-10-CM | POA: Diagnosis not present

## 2022-03-31 DIAGNOSIS — F4389 Other reactions to severe stress: Secondary | ICD-10-CM | POA: Diagnosis not present

## 2022-03-31 DIAGNOSIS — F332 Major depressive disorder, recurrent severe without psychotic features: Secondary | ICD-10-CM | POA: Diagnosis not present

## 2022-03-31 DIAGNOSIS — F411 Generalized anxiety disorder: Secondary | ICD-10-CM | POA: Diagnosis not present

## 2022-04-14 DIAGNOSIS — F411 Generalized anxiety disorder: Secondary | ICD-10-CM | POA: Diagnosis not present

## 2022-04-14 DIAGNOSIS — F332 Major depressive disorder, recurrent severe without psychotic features: Secondary | ICD-10-CM | POA: Diagnosis not present

## 2022-04-14 DIAGNOSIS — F4389 Other reactions to severe stress: Secondary | ICD-10-CM | POA: Diagnosis not present

## 2022-04-21 DIAGNOSIS — F4389 Other reactions to severe stress: Secondary | ICD-10-CM | POA: Diagnosis not present

## 2022-04-21 DIAGNOSIS — F411 Generalized anxiety disorder: Secondary | ICD-10-CM | POA: Diagnosis not present

## 2022-04-21 DIAGNOSIS — F332 Major depressive disorder, recurrent severe without psychotic features: Secondary | ICD-10-CM | POA: Diagnosis not present

## 2022-04-28 DIAGNOSIS — J209 Acute bronchitis, unspecified: Secondary | ICD-10-CM | POA: Diagnosis not present

## 2022-04-28 DIAGNOSIS — R059 Cough, unspecified: Secondary | ICD-10-CM | POA: Diagnosis not present

## 2022-05-12 DIAGNOSIS — F4389 Other reactions to severe stress: Secondary | ICD-10-CM | POA: Diagnosis not present

## 2022-05-12 DIAGNOSIS — F332 Major depressive disorder, recurrent severe without psychotic features: Secondary | ICD-10-CM | POA: Diagnosis not present

## 2022-05-12 DIAGNOSIS — F411 Generalized anxiety disorder: Secondary | ICD-10-CM | POA: Diagnosis not present

## 2022-05-16 DIAGNOSIS — R110 Nausea: Secondary | ICD-10-CM | POA: Diagnosis not present

## 2022-05-16 DIAGNOSIS — R0602 Shortness of breath: Secondary | ICD-10-CM | POA: Diagnosis not present

## 2022-05-16 DIAGNOSIS — R053 Chronic cough: Secondary | ICD-10-CM | POA: Diagnosis not present

## 2022-05-17 ENCOUNTER — Ambulatory Visit
Admission: RE | Admit: 2022-05-17 | Discharge: 2022-05-17 | Disposition: A | Discharge status: Home / Self Care | Payer: BC Managed Care – PPO | Source: Ambulatory Visit | Attending: Family Medicine | Admitting: Family Medicine

## 2022-05-17 ENCOUNTER — Other Ambulatory Visit: Payer: Self-pay | Admitting: Family Medicine

## 2022-05-17 DIAGNOSIS — R0602 Shortness of breath: Secondary | ICD-10-CM

## 2022-05-17 DIAGNOSIS — R059 Cough, unspecified: Secondary | ICD-10-CM | POA: Diagnosis not present

## 2022-05-23 ENCOUNTER — Other Ambulatory Visit (HOSPITAL_BASED_OUTPATIENT_CLINIC_OR_DEPARTMENT_OTHER): Payer: BC Managed Care – PPO

## 2022-05-24 DIAGNOSIS — J40 Bronchitis, not specified as acute or chronic: Secondary | ICD-10-CM | POA: Diagnosis not present

## 2022-05-24 DIAGNOSIS — M79645 Pain in left finger(s): Secondary | ICD-10-CM | POA: Diagnosis not present

## 2022-05-24 DIAGNOSIS — M26609 Unspecified temporomandibular joint disorder, unspecified side: Secondary | ICD-10-CM | POA: Diagnosis not present

## 2022-05-26 DIAGNOSIS — F4389 Other reactions to severe stress: Secondary | ICD-10-CM | POA: Diagnosis not present

## 2022-05-26 DIAGNOSIS — F332 Major depressive disorder, recurrent severe without psychotic features: Secondary | ICD-10-CM | POA: Diagnosis not present

## 2022-05-26 DIAGNOSIS — F411 Generalized anxiety disorder: Secondary | ICD-10-CM | POA: Diagnosis not present

## 2022-05-31 DIAGNOSIS — M1812 Unilateral primary osteoarthritis of first carpometacarpal joint, left hand: Secondary | ICD-10-CM | POA: Diagnosis not present

## 2022-06-02 DIAGNOSIS — F4389 Other reactions to severe stress: Secondary | ICD-10-CM | POA: Diagnosis not present

## 2022-06-02 DIAGNOSIS — F332 Major depressive disorder, recurrent severe without psychotic features: Secondary | ICD-10-CM | POA: Diagnosis not present

## 2022-06-08 ENCOUNTER — Other Ambulatory Visit: Payer: Self-pay | Admitting: Psychiatry

## 2022-06-08 ENCOUNTER — Other Ambulatory Visit: Payer: Self-pay | Admitting: Gastroenterology

## 2022-06-08 DIAGNOSIS — F411 Generalized anxiety disorder: Secondary | ICD-10-CM

## 2022-06-08 DIAGNOSIS — F331 Major depressive disorder, recurrent, moderate: Secondary | ICD-10-CM

## 2022-06-08 DIAGNOSIS — F5105 Insomnia due to other mental disorder: Secondary | ICD-10-CM

## 2022-06-16 DIAGNOSIS — F4389 Other reactions to severe stress: Secondary | ICD-10-CM | POA: Diagnosis not present

## 2022-06-19 ENCOUNTER — Encounter: Payer: Self-pay | Admitting: Psychiatry

## 2022-06-19 ENCOUNTER — Ambulatory Visit (INDEPENDENT_AMBULATORY_CARE_PROVIDER_SITE_OTHER): Payer: BC Managed Care – PPO | Admitting: Psychiatry

## 2022-06-19 DIAGNOSIS — F3342 Major depressive disorder, recurrent, in full remission: Secondary | ICD-10-CM

## 2022-06-19 DIAGNOSIS — F99 Mental disorder, not otherwise specified: Secondary | ICD-10-CM

## 2022-06-19 DIAGNOSIS — F515 Nightmare disorder: Secondary | ICD-10-CM

## 2022-06-19 DIAGNOSIS — F411 Generalized anxiety disorder: Secondary | ICD-10-CM | POA: Diagnosis not present

## 2022-06-19 DIAGNOSIS — F331 Major depressive disorder, recurrent, moderate: Secondary | ICD-10-CM | POA: Diagnosis not present

## 2022-06-19 DIAGNOSIS — F5105 Insomnia due to other mental disorder: Secondary | ICD-10-CM

## 2022-06-19 DIAGNOSIS — M797 Fibromyalgia: Secondary | ICD-10-CM

## 2022-06-19 DIAGNOSIS — F900 Attention-deficit hyperactivity disorder, predominantly inattentive type: Secondary | ICD-10-CM | POA: Diagnosis not present

## 2022-06-19 MED ORDER — TRAZODONE HCL 100 MG PO TABS: 100.0000 mg | ORAL_TABLET | Freq: Every day | ORAL | 1 refills | Status: DC

## 2022-06-19 MED ORDER — LAMOTRIGINE 100 MG PO TABS: 100.0000 mg | ORAL_TABLET | Freq: Every day | ORAL | 1 refills | Status: DC

## 2022-06-19 MED ORDER — BUPROPION HCL ER (XL) 150 MG PO TB24: 150.0000 mg | ORAL_TABLET | Freq: Every day | ORAL | 1 refills | Status: DC

## 2022-06-19 NOTE — Patient Instructions (Signed)
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
## 2022-06-30 DIAGNOSIS — F4389 Other reactions to severe stress: Secondary | ICD-10-CM | POA: Diagnosis not present

## 2022-07-07 ENCOUNTER — Other Ambulatory Visit: Payer: Self-pay | Admitting: Psychiatry

## 2022-07-07 DIAGNOSIS — F4389 Other reactions to severe stress: Secondary | ICD-10-CM | POA: Diagnosis not present

## 2022-07-07 DIAGNOSIS — F5105 Insomnia due to other mental disorder: Secondary | ICD-10-CM

## 2022-07-17 DIAGNOSIS — H6993 Unspecified Eustachian tube disorder, bilateral: Secondary | ICD-10-CM | POA: Diagnosis not present

## 2022-07-19 DIAGNOSIS — M1812 Unilateral primary osteoarthritis of first carpometacarpal joint, left hand: Secondary | ICD-10-CM | POA: Diagnosis not present

## 2022-07-28 DIAGNOSIS — R7309 Other abnormal glucose: Secondary | ICD-10-CM | POA: Diagnosis not present

## 2022-07-28 DIAGNOSIS — E559 Vitamin D deficiency, unspecified: Secondary | ICD-10-CM | POA: Diagnosis not present

## 2022-07-28 DIAGNOSIS — F4389 Other reactions to severe stress: Secondary | ICD-10-CM | POA: Diagnosis not present

## 2022-07-28 DIAGNOSIS — R5383 Other fatigue: Secondary | ICD-10-CM | POA: Diagnosis not present

## 2022-07-28 DIAGNOSIS — K9049 Malabsorption due to intolerance, not elsewhere classified: Secondary | ICD-10-CM | POA: Diagnosis not present

## 2022-07-28 DIAGNOSIS — Z0000 Encounter for general adult medical examination without abnormal findings: Secondary | ICD-10-CM | POA: Diagnosis not present

## 2022-07-28 DIAGNOSIS — E538 Deficiency of other specified B group vitamins: Secondary | ICD-10-CM | POA: Diagnosis not present

## 2022-07-28 DIAGNOSIS — Z23 Encounter for immunization: Secondary | ICD-10-CM | POA: Diagnosis not present

## 2022-07-28 DIAGNOSIS — Z789 Other specified health status: Secondary | ICD-10-CM | POA: Diagnosis not present

## 2022-07-28 DIAGNOSIS — Z13220 Encounter for screening for lipoid disorders: Secondary | ICD-10-CM | POA: Diagnosis not present

## 2022-08-04 DIAGNOSIS — F4389 Other reactions to severe stress: Secondary | ICD-10-CM | POA: Diagnosis not present

## 2022-08-18 DIAGNOSIS — F4389 Other reactions to severe stress: Secondary | ICD-10-CM | POA: Diagnosis not present

## 2022-08-21 DIAGNOSIS — M1812 Unilateral primary osteoarthritis of first carpometacarpal joint, left hand: Secondary | ICD-10-CM | POA: Diagnosis not present

## 2022-08-25 DIAGNOSIS — G8929 Other chronic pain: Secondary | ICD-10-CM | POA: Diagnosis not present

## 2022-08-25 DIAGNOSIS — F4389 Other reactions to severe stress: Secondary | ICD-10-CM | POA: Diagnosis not present

## 2022-08-25 DIAGNOSIS — M26609 Unspecified temporomandibular joint disorder, unspecified side: Secondary | ICD-10-CM | POA: Diagnosis not present

## 2022-08-25 DIAGNOSIS — G43909 Migraine, unspecified, not intractable, without status migrainosus: Secondary | ICD-10-CM | POA: Diagnosis not present

## 2022-08-25 DIAGNOSIS — J309 Allergic rhinitis, unspecified: Secondary | ICD-10-CM | POA: Diagnosis not present

## 2022-09-01 DIAGNOSIS — F4389 Other reactions to severe stress: Secondary | ICD-10-CM | POA: Diagnosis not present

## 2022-09-08 DIAGNOSIS — F4389 Other reactions to severe stress: Secondary | ICD-10-CM | POA: Diagnosis not present

## 2022-09-15 DIAGNOSIS — F4389 Other reactions to severe stress: Secondary | ICD-10-CM | POA: Diagnosis not present

## 2022-09-18 ENCOUNTER — Encounter: Payer: Self-pay | Admitting: Psychiatry

## 2022-09-18 ENCOUNTER — Ambulatory Visit (INDEPENDENT_AMBULATORY_CARE_PROVIDER_SITE_OTHER): Payer: BC Managed Care – PPO | Admitting: Psychiatry

## 2022-09-18 DIAGNOSIS — F99 Mental disorder, not otherwise specified: Secondary | ICD-10-CM

## 2022-09-18 DIAGNOSIS — F5105 Insomnia due to other mental disorder: Secondary | ICD-10-CM

## 2022-09-18 DIAGNOSIS — M797 Fibromyalgia: Secondary | ICD-10-CM

## 2022-09-18 DIAGNOSIS — F900 Attention-deficit hyperactivity disorder, predominantly inattentive type: Secondary | ICD-10-CM | POA: Diagnosis not present

## 2022-09-18 DIAGNOSIS — F515 Nightmare disorder: Secondary | ICD-10-CM

## 2022-09-18 DIAGNOSIS — F411 Generalized anxiety disorder: Secondary | ICD-10-CM | POA: Diagnosis not present

## 2022-09-18 DIAGNOSIS — F3342 Major depressive disorder, recurrent, in full remission: Secondary | ICD-10-CM | POA: Diagnosis not present

## 2022-09-18 DIAGNOSIS — F331 Major depressive disorder, recurrent, moderate: Secondary | ICD-10-CM

## 2022-09-18 MED ORDER — TRAZODONE HCL 100 MG PO TABS: 100.0000 mg | ORAL_TABLET | Freq: Every day | ORAL | 1 refills | Status: DC

## 2022-09-18 MED ORDER — CLONAZEPAM 1 MG PO TABS: 1.0000 mg | ORAL_TABLET | Freq: Every day | ORAL | 1 refills | Status: DC

## 2022-09-18 MED ORDER — SERTRALINE HCL 25 MG PO TABS: 37.5000 mg | ORAL_TABLET | Freq: Every day | ORAL | 0 refills | Status: DC
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
## 2022-09-22 DIAGNOSIS — F4389 Other reactions to severe stress: Secondary | ICD-10-CM | POA: Diagnosis not present

## 2022-09-25 DIAGNOSIS — E669 Obesity, unspecified: Secondary | ICD-10-CM | POA: Diagnosis not present

## 2022-09-25 DIAGNOSIS — Z6833 Body mass index (BMI) 33.0-33.9, adult: Secondary | ICD-10-CM | POA: Diagnosis not present

## 2022-09-25 DIAGNOSIS — K5190 Ulcerative colitis, unspecified, without complications: Secondary | ICD-10-CM | POA: Diagnosis not present

## 2022-09-27 ENCOUNTER — Ambulatory Visit: Payer: BC Managed Care – PPO | Admitting: Obstetrics and Gynecology

## 2022-10-06 DIAGNOSIS — Z23 Encounter for immunization: Secondary | ICD-10-CM | POA: Diagnosis not present

## 2022-10-06 DIAGNOSIS — F4389 Other reactions to severe stress: Secondary | ICD-10-CM | POA: Diagnosis not present

## 2022-10-11 DIAGNOSIS — F4389 Other reactions to severe stress: Secondary | ICD-10-CM | POA: Diagnosis not present

## 2022-10-13 ENCOUNTER — Ambulatory Visit
Admission: RE | Admit: 2022-10-13 | Discharge: 2022-10-13 | Disposition: A | Discharge status: Home / Self Care | Payer: BC Managed Care – PPO | Source: Ambulatory Visit | Attending: Obstetrics and Gynecology | Admitting: Obstetrics and Gynecology

## 2022-10-13 DIAGNOSIS — N6489 Other specified disorders of breast: Secondary | ICD-10-CM | POA: Diagnosis not present

## 2022-10-20 DIAGNOSIS — G8918 Other acute postprocedural pain: Secondary | ICD-10-CM | POA: Diagnosis not present

## 2022-10-20 DIAGNOSIS — M1812 Unilateral primary osteoarthritis of first carpometacarpal joint, left hand: Secondary | ICD-10-CM | POA: Diagnosis not present

## 2022-10-20 HISTORY — PX: WRIST ARTHROSCOPY WITH CARPOMETACARPEL (CMC) ARTHROPLASTY: SHX5680

## 2022-11-02 DIAGNOSIS — M79642 Pain in left hand: Secondary | ICD-10-CM | POA: Diagnosis not present

## 2022-11-02 DIAGNOSIS — M1812 Unilateral primary osteoarthritis of first carpometacarpal joint, left hand: Secondary | ICD-10-CM | POA: Diagnosis not present

## 2022-11-03 DIAGNOSIS — F4389 Other reactions to severe stress: Secondary | ICD-10-CM | POA: Diagnosis not present

## 2022-11-06 DIAGNOSIS — M79642 Pain in left hand: Secondary | ICD-10-CM | POA: Diagnosis not present

## 2022-11-10 DIAGNOSIS — F4389 Other reactions to severe stress: Secondary | ICD-10-CM | POA: Diagnosis not present

## 2022-11-13 ENCOUNTER — Ambulatory Visit: Payer: BC Managed Care – PPO | Admitting: Obstetrics and Gynecology

## 2022-11-13 DIAGNOSIS — M79642 Pain in left hand: Secondary | ICD-10-CM | POA: Diagnosis not present

## 2022-11-14 ENCOUNTER — Telehealth: Payer: Self-pay

## 2022-11-14 NOTE — Telephone Encounter (Signed)
Order faxed successfully to [facility].  

## 2022-11-14 NOTE — Telephone Encounter (Signed)
-----   Message from Jerilynn Mages sent at 11/14/2022 10:41 AM EDT ----- Regarding: bone density Patient due for bone density after September 27/ Please send [facility] to [facility] for patient to have it done there. Thanks

## 2022-11-14 NOTE — Telephone Encounter (Signed)
Pt advised that [facility] was booking out until late December into early January for DEXA scans. However, [facility] may be able to offer her sooner/more convenient availability for her imaging but if since she receives her mammos at [facility], she is more than welcome to have her DEXAs done there too, she just may have to wait a bit longer.   Pt states she would like to have done at [facility]. Will write up order and place on providers desk for authorization.   Last AEX 07/12/2021--scheduled for 03/12/2023.

## 2022-11-17 DIAGNOSIS — F4389 Other reactions to severe stress: Secondary | ICD-10-CM | POA: Diagnosis not present

## 2022-11-22 ENCOUNTER — Telehealth: Payer: Self-pay | Admitting: Nurse Practitioner

## 2022-11-22 NOTE — Telephone Encounter (Signed)
Inbound call from patient requesting a call back asap.. she said she is having a flare up, her abdominal area its on "fire" and she also has Esophageal spasms.Please advise.

## 2022-11-23 ENCOUNTER — Other Ambulatory Visit: Payer: Self-pay

## 2022-11-23 DIAGNOSIS — N3000 Acute cystitis without hematuria: Secondary | ICD-10-CM | POA: Diagnosis not present

## 2022-11-23 MED ORDER — SUCRALFATE 1 GM/10ML PO SUSP: 1.0000 g | Freq: Three times a day (TID) | ORAL | 0 refills | Status: DC

## 2022-11-23 MED ORDER — DICYCLOMINE HCL 10 MG PO CAPS: 10.0000 mg | ORAL_CAPSULE | Freq: Two times a day (BID) | ORAL | 0 refills | Status: DC | PRN

## 2022-11-23 NOTE — Telephone Encounter (Signed)
Please send prescription for dicyclomine 10 mg every 8 as needed and Carafate 1 g before meals and at bedtime as needed so she has adequate refills until the follow-up appointment.  Thank you

## 2022-11-23 NOTE — Telephone Encounter (Signed)
Spoke with the patient. Patient ate a "spicy red sauce" then developed esophageal burning and abdominal pain. She has experienced this before. Previously treated with Carafate and dicyclomine. She has not used these medications in over a year.  Patient has reflux. She is on omeprazole 40 mg daily. She denies missed doses. Appointment for today offered. Patient cannot get to our office in time and declines to come today because of this. Next opening 11/30/22 at 11:30. Patient accepts this appointment. Discussed bland, soft diet and hydration.  She asks if she can have Carafate and dicyclomine refill until the appointment.

## 2022-11-23 NOTE — Telephone Encounter (Signed)
Prescriptions sent to the [facility]. Message left for the patient to make her aware.

## 2022-11-28 ENCOUNTER — Other Ambulatory Visit: Payer: Self-pay

## 2022-11-28 ENCOUNTER — Encounter: Payer: Self-pay | Admitting: Internal Medicine

## 2022-11-28 ENCOUNTER — Ambulatory Visit: Payer: BC Managed Care – PPO | Admitting: Internal Medicine

## 2022-11-28 VITALS — BP 102/74 | HR 74 | Temp 98.2°F | Ht 64.17 in | Wt 196.6 lb

## 2022-11-28 DIAGNOSIS — G43909 Migraine, unspecified, not intractable, without status migrainosus: Secondary | ICD-10-CM

## 2022-11-28 DIAGNOSIS — J343 Hypertrophy of nasal turbinates: Secondary | ICD-10-CM

## 2022-11-28 DIAGNOSIS — J3089 Other allergic rhinitis: Secondary | ICD-10-CM

## 2022-11-28 DIAGNOSIS — B999 Unspecified infectious disease: Secondary | ICD-10-CM

## 2022-11-28 MED ORDER — TRIAMCINOLONE ACETONIDE 55 MCG/ACT NA AERO: 2.0000 | INHALATION_SPRAY | Freq: Every day | NASAL | 5 refills | Status: DC

## 2022-11-28 MED ORDER — AZELASTINE HCL 0.1 % NA SOLN: 1.0000 | Freq: Two times a day (BID) | NASAL | 5 refills | Status: DC | PRN

## 2022-11-28 MED ORDER — CETIRIZINE HCL 10 MG PO TABS: 10.0000 mg | ORAL_TABLET | Freq: Every day | ORAL | 5 refills | Status: DC

## 2022-11-28 NOTE — Progress Notes (Addendum)
NEW PATIENT  Date of Service/Encounter:  01/30/23  Consult requested by: Camie Patience, FNP   Subjective:   Marisa Hamilton (DOB: July 18, 1960) is a 62 y.o. female who presents to the clinic on 11/28/2022 with a chief complaint of Allergies, Sinusitis, Headache, Pain, and Drainage .    History obtained from: chart review and patient.  Rhinitis:  Started 30 years ago.  Symptoms include:  nasal congestion, rhinorrhea, post nasal drainage, and sneezing   Occurs year-round Potential triggers: not sure   Treatments tried:  Did AIT around 2006; did this for several years but did not help  Claritin daily; last use was over 3 days ago Nasacort PRN   Previous allergy testing: yes; can't recall exact results  History of reflux/heartburn: yes; Prilosec 40mg  daily. Followed by GI.   History of sinus surgery: yes around age 37; deviated septum  Nonallergic triggers: none    Recurrent Infections: Does have trouble with 2-3 occurrences of sinus infections that are prolonged for months and require multiple courses of antibiotics.  Reports greenish drainage, pressure like pain and rhinorrhea.  Has seen ENT Dr. Marene Lenz with unremarkable sinus CT 12/2020 and nasal endoscopy 08/2022.Also referred to Allergy for further evaluation.  Discussed INCS, OAH, nasal saline.   Denies hx of lung infections, GI infections, skin infections, bone infections. Never required IV abx or hospitalization for infection.   Past Medical History: Past Medical History:  Diagnosis Date   Anxiety    Back pain    CFS (chronic fatigue syndrome)    Concussion 03/17/2017   Constipation    Cystitis, interstitial    HAD BLADDER SX FOR SAME   Depression    Fainting spell    Fibromyalgia    Food allergy    Foot pain    GERD (gastroesophageal reflux disease)    Headache(784.0)    IBS (irritable bowel syndrome)    TAKES PROBIOTICS   Insomnia    Lactose intolerance    Leg pain    Migraine    MVP (mitral  valve prolapse)    Nausea    Neck pain    PONV (postoperative nausea and vomiting)    Recurrent upper respiratory infection (URI)    Restless leg syndrome    Stomach ache    Ulcerative colitis (HCC)    Varicose veins    Past Surgical History: Past Surgical History:  Procedure Laterality Date   ABDOMINOPLASTY  4/14   BLADDER SURGERY     FOR IC   BREAST BIOPSY Left 02-26-12   negative   BREAST REDUCTION SURGERY  10/10/2011   Procedure: MAMMARY REDUCTION  (BREAST);  Surgeon: Karie Fetch, MD;  Location: [facility];  Service: Plastics;  Laterality: Bilateral;   COLONOSCOPY  10/22/2015   Dr.Mann   LASIK     NASAL SEPTUM SURGERY     REDUCTION MAMMAPLASTY Bilateral    VASCULAR SURGERY     VARICOSE VEINS    Family History: Family History  Problem Relation Age of Onset   Osteoporosis Mother    Atrial fibrillation Mother    Heart disease Mother    Stroke Mother    Depression Mother    Anxiety disorder Mother    Obesity Mother    Angioedema Father    Diabetes Father    Hypertension Father    Depression Father    Obesity Father    Migraines Brother        CHRONIC   Breast cancer Maternal Grandmother  Cancer Maternal Grandmother        ovarian   Diabetes Paternal Grandmother    Colon cancer Neg Hx    Esophageal cancer Neg Hx    Pancreatic cancer Neg Hx    Stomach cancer Neg Hx    Liver disease Neg Hx    Rectal cancer Neg Hx    Colon polyps Neg Hx     Social History:  Flooring in bedroom: carpet Pets: none Tobacco use/exposure: none Job: retired  Medication List:  Allergies as of 11/28/2022       Reactions   Selenium Dioxide [selenium] Anaphylaxis   Gluten Meal Other (See Comments)   Stomach upset Stomach upset Stomach upset   Milk (cow) Other (See Comments)   Stomach upset   Nsaids Other (See Comments)   Other reaction(s): Abdominal Pain Ulcerative colitis Other reaction(s): Abdominal Pain Ulcerative colitis Ulcerative  colitis Other reaction(s): Abdominal Pain Ulcerative colitis   Phentermine Other (See Comments)   Pt stated, "Made my IC flare up" Pt stated, "Made my IC flare up" Pt stated, "Made my IC flare up"   Amitriptyline Hcl Other (See Comments)   Lactase-lactobacillus Other (See Comments)   Meloxicam Nausea And Vomiting   Milk-related Compounds Other (See Comments)   Stomach upset        Medication List        Accurate as of November 28, 2022 11:59 PM. If you have any questions, ask your nurse or doctor.          STOP taking these medications    loratadine 10 MG tablet Commonly known as: CLARITIN Stopped by: Birder Robson       TAKE these medications    azelastine 0.1 % nasal spray Commonly known as: ASTELIN Place 1 spray into both nostrils 2 (two) times daily as needed for rhinitis. Use in each nostril as directed Started by: Birder Robson   BENEFIBER PO Take by mouth.   cetirizine 10 MG tablet Commonly known as: ZyrTEC Allergy Take 1 tablet (10 mg total) by mouth daily. Started by: Birder Robson   chlorproMAZINE 25 MG tablet Commonly known as: THORAZINE PRN   clonazePAM 1 MG tablet Commonly known as: KLONOPIN Take 1 tablet (1 mg total) by mouth at bedtime.   cloNIDine 0.1 MG tablet Commonly known as: CATAPRES 1 daily prn anxiety What changed:  how much to take how to take this when to take this reasons to take this   dicyclomine 10 MG capsule Commonly known as: BENTYL Take 1 capsule (10 mg total) by mouth 2 (two) times daily as needed for spasms.   famotidine 20 MG tablet Commonly known as: PEPCID Take 20 mg by mouth daily as needed for heartburn or indigestion.   ketorolac 60 MG/2ML Soln injection Commonly known as: TORADOL Inject 60 mg into the muscle as needed (migraines).   lamoTRIgine 100 MG tablet Commonly known as: LAMICTAL Take 1 tablet (100 mg total) by mouth daily.   omeprazole 40 MG capsule Commonly known as: PRILOSEC TAKE 1  CAPSULE BY MOUTH EVERY DAY   ondansetron 4 MG disintegrating tablet Commonly known as: ZOFRAN-ODT Take 1 tablet (4 mg total) by mouth every 8 (eight) hours as needed for nausea or vomiting.   ondansetron 4 MG tablet Commonly known as: ZOFRAN TAKE 1 TABLET BY MOUTH EVERY 8 HOURS AS NEEDED FOR NAUSEA AND VOMITING   Reyvow 100 MG Tabs Generic drug: Lasmiditan Succinate Take 100 mg by mouth as needed.  rizatriptan 10 MG tablet Commonly known as: MAXALT Take by mouth.   sertraline 25 MG tablet Commonly known as: ZOLOFT Take 1.5 tablets (37.5 mg total) by mouth daily.   simethicone 125 MG chewable tablet Commonly known as: MYLICON Chew 125 mg by mouth every 6 (six) hours as needed for flatulence.   sucralfate 1 GM/10ML suspension Commonly known as: Carafate Take 10 mLs (1 g total) by mouth 4 (four) times daily -  with meals and at bedtime. As needed   tiZANidine 4 MG tablet Commonly known as: ZANAFLEX Take 1 tablet (4 mg total) by mouth at bedtime.   topiramate 50 MG tablet Commonly known as: Topamax Take 1 tablet (50 mg total) by mouth 2 (two) times daily. What changed:  how much to take when to take this   traZODone 100 MG tablet Commonly known as: DESYREL Take 1 tablet (100 mg total) by mouth at bedtime.   triamcinolone 55 MCG/ACT Aero nasal inhaler Commonly known as: NASACORT Place 2 sprays into the nose daily. What changed:  medication strength when to take this Changed by: Birder Robson         REVIEW OF SYSTEMS: Pertinent positives and negatives discussed in HPI.   Objective:   Physical Exam: BP 102/74   Pulse 74   Temp 98.2 F (36.8 C) (Temporal)   Ht 5' 4.17" (1.63 m)   Wt 196 lb 9.6 oz (89.2 kg)   LMP 07/29/2012 Comment: spotting that week  SpO2 96%   BMI 33.56 kg/m  Body mass index is 33.56 kg/m. GEN: alert, well developed HEENT: clear conjunctiva, TM grey and translucent, nose with + inferior turbinate hypertrophy, pink nasal mucosa,  slight clear rhinorrhea, no cobblestoning HEART: regular rate and rhythm, no murmur LUNGS: clear to auscultation bilaterally, no coughing, unlabored respiration ABDOMEN: soft, non distended  SKIN: no rashes or lesions  Reviewed:  08/25/2022: saw Dr. Marene Lenz ENT with normal endoscopy and previously with normal sinus CT.  Also has TMJ and migraine syndrome. Referred to Allergy for further evaluation.    09/18/2022: Followed by Dr Jennelle Human for MDD, GAD, ADHD, Insomnia, Fibromyalgia.  On Lamotrigine, Sertraline, Trazodone, Clonidine.   11/16/2021: seen by Neurology for chronic migraines, has multiple medications for control and rescue.   11/02/2021: seen by urgent care for bl ear stuffiness, sinus congestion.  Discussed acute sinusitis and AOM, started on Nasonex, Azelastine, Meclizine, Augmentin.   Skin Testing:  Skin prick testing was placed, which includes aeroallergens/foods, histamine control, and saline control.  Verbal consent was obtained prior to placing test.  Patient tolerated procedure well.  Allergy testing results were read and interpreted by myself, documented by clinical staff. Adequate positive and negative control.  Results discussed with patient/family.      Assessment:   1. Nasal turbinate hypertrophy   2. Recurrent infections   3. Other allergic rhinitis   4. Migraine syndrome     Plan/Recommendations:  Other Allergic Rhinitis: - Due to turbinate hypertrophy and unresponsive to over the counter meds, performed skin testing to identify aeroallergen triggers.   - Positive skin test 11/2022: negative.  - Avoidance measures discussed. - Use nasal saline rinses before nose sprays such as with Neilmed Sinus Rinse.  Use distilled water.   - Use Nasacort 2 sprays each nostril daily. Aim upward and outward. - Use Azelastine 1-2 sprays each nostril twice daily as needed for runny nose, drainage, sneezing, congestion. Aim upward and outward. - Use Zyrtec 10 mg daily.  Stop  Claritin.   -  Also has hx of migraines and discussed that could possibly explain the significant facial pain while her other symptoms might be related to viral illness rather than bacterial sinusitis.     Recurrent Infection - Recurrent sinus infections with multiple courses of antibiotics.  Will screen for immunodeficiency with immunoglobulins and vaccine titers.  - We will obtain bloodwork today to evaluate this.      Return in about 6 weeks (around 01/09/2023).  Alesia Morin, MD [facility]

## 2022-11-28 NOTE — Patient Instructions (Addendum)
Chronic Rhinitis: - Positive skin test 11/2022: negative.  - Avoidance measures discussed. - Use nasal saline rinses before nose sprays such as with Neilmed Sinus Rinse.  Use distilled water.   - Use Nasacort 2 sprays each nostril daily. Aim upward and outward. - Use Azelastine 1-2 sprays each nostril twice daily as needed for runny nose, drainage, sneezing, congestion. Aim upward and outward. - Use Zyrtec 10 mg daily.  Stop Claritin.     Recurrent Infection - We will obtain bloodwork today to evaluate this.

## 2022-11-30 ENCOUNTER — Ambulatory Visit: Payer: BC Managed Care – PPO | Admitting: Gastroenterology

## 2022-11-30 LAB — STREP PNEUMONIAE 23 SEROTYPES IGG

## 2022-11-30 LAB — DIPHTHERIA / TETANUS ANTIBODY PANEL: Diphtheria Ab: 0.23 IU/mL (ref <0.10)

## 2022-11-30 LAB — IGG, IGA, IGM
IgA/Immunoglobulin A, Serum: 268 mg/dL (ref 87–352)
IgM (Immunoglobulin M), Srm: 197 mg/dL (ref 26–217)

## 2022-12-01 DIAGNOSIS — M79642 Pain in left hand: Secondary | ICD-10-CM | POA: Diagnosis not present

## 2022-12-01 DIAGNOSIS — F4389 Other reactions to severe stress: Secondary | ICD-10-CM | POA: Diagnosis not present

## 2022-12-02 LAB — STREP PNEUMONIAE 23 SEROTYPES IGG
Pneumo Ab Type 17 (17F)*: 0.8 ug/mL — ABNORMAL LOW (ref >1.3)
Pneumo Ab Type 20*: 0.9 ug/mL — ABNORMAL LOW (ref >1.3)
Pneumo Ab Type 23 (23F)*: 0.1 ug/mL — ABNORMAL LOW (ref >1.3)
Pneumo Ab Type 3*: <0.1 ug/mL — ABNORMAL LOW (ref >1.3)
Pneumo Ab Type 34 (10A)*: 18 ug/mL (ref >1.3)
Pneumo Ab Type 5*: <0.1 ug/mL — ABNORMAL LOW (ref >1.3)
Pneumo Ab Type 51 (7F)*: <0.1 ug/mL — ABNORMAL LOW (ref >1.3)
Pneumo Ab Type 56 (18C)*: <0.1 ug/mL — ABNORMAL LOW (ref >1.3)
Pneumo Ab Type 57 (19A)*: 0.8 ug/mL — ABNORMAL LOW (ref >1.3)
Pneumo Ab Type 8*: <0.3 ug/mL — ABNORMAL LOW (ref >1.3)

## 2022-12-02 LAB — DIPHTHERIA / TETANUS ANTIBODY PANEL: Tetanus Ab, IgG: 2.75 IU/mL (ref <0.10)

## 2022-12-02 LAB — IGG, IGA, IGM: IgG (Immunoglobin G), Serum: 1017 mg/dL (ref 586–1602)

## 2022-12-06 NOTE — Telephone Encounter (Signed)
Per [facility], pt is scheduled for DEXA on 02/23/2023.   Routing to provider for final review and closing.

## 2022-12-07 DIAGNOSIS — M79642 Pain in left hand: Secondary | ICD-10-CM | POA: Diagnosis not present

## 2022-12-08 DIAGNOSIS — F4389 Other reactions to severe stress: Secondary | ICD-10-CM | POA: Diagnosis not present

## 2022-12-12 ENCOUNTER — Other Ambulatory Visit: Payer: Self-pay | Admitting: Gastroenterology

## 2022-12-12 ENCOUNTER — Telehealth: Payer: Self-pay | Admitting: Nurse Practitioner

## 2022-12-12 ENCOUNTER — Other Ambulatory Visit: Payer: Self-pay | Admitting: Psychiatry

## 2022-12-12 DIAGNOSIS — F331 Major depressive disorder, recurrent, moderate: Secondary | ICD-10-CM

## 2022-12-12 DIAGNOSIS — F411 Generalized anxiety disorder: Secondary | ICD-10-CM

## 2022-12-12 NOTE — Telephone Encounter (Signed)
Inbound call from patient stating she has an appointment scheduled for 9/30 with [facility] and is requesting a call to discuss her symptoms in the meantime. Patient stated she had a esophageal attack on 7/8. Please advise.

## 2022-12-13 NOTE — Telephone Encounter (Signed)
Spoke with the patient. She is continuing to have issues with her esophagus and the swallowing. We reviewed the medications and the reasons for them. She will resume taking famotidine and Carafate as directed. She also has a feeling of being out of breath and becoming fatigued. I explained these are not GI symptoms and encouraged her to call her PCP office to be seen for this. Patient agrees to do this.

## 2022-12-14 ENCOUNTER — Ambulatory Visit
Admission: RE | Admit: 2022-12-14 | Discharge: 2022-12-14 | Disposition: A | Discharge status: Home / Self Care | Payer: BC Managed Care – PPO | Source: Ambulatory Visit | Attending: Family Medicine | Admitting: Family Medicine

## 2022-12-14 ENCOUNTER — Other Ambulatory Visit: Payer: Self-pay | Admitting: Family Medicine

## 2022-12-14 DIAGNOSIS — R002 Palpitations: Secondary | ICD-10-CM | POA: Diagnosis not present

## 2022-12-14 DIAGNOSIS — R0789 Other chest pain: Secondary | ICD-10-CM

## 2022-12-14 DIAGNOSIS — E559 Vitamin D deficiency, unspecified: Secondary | ICD-10-CM | POA: Diagnosis not present

## 2022-12-14 DIAGNOSIS — E538 Deficiency of other specified B group vitamins: Secondary | ICD-10-CM | POA: Diagnosis not present

## 2022-12-15 ENCOUNTER — Ambulatory Visit (INDEPENDENT_AMBULATORY_CARE_PROVIDER_SITE_OTHER): Payer: BC Managed Care – PPO

## 2022-12-15 DIAGNOSIS — Z23 Encounter for immunization: Secondary | ICD-10-CM | POA: Diagnosis not present

## 2022-12-15 DIAGNOSIS — B999 Unspecified infectious disease: Secondary | ICD-10-CM

## 2022-12-15 NOTE — Progress Notes (Signed)
Immunotherapy   Patient Details  Name: Marisa Hamilton MRN: [id number] Date of Birth: [birth year]  12/15/2022  [name] here to receive 0.5 ml Pneumovax 23. Labs were printed and given to [facility] for when patient comes back on 01/29/2023 for repeat titers. [name]  NDC- 4098-1191-47 Lot -[id number]  Exp- 12/27/2023   [name] 12/15/2022, 9:52 AM

## 2022-12-26 DIAGNOSIS — M79642 Pain in left hand: Secondary | ICD-10-CM | POA: Diagnosis not present

## 2022-12-29 DIAGNOSIS — F4389 Other reactions to severe stress: Secondary | ICD-10-CM | POA: Diagnosis not present

## 2022-12-29 DIAGNOSIS — M79642 Pain in left hand: Secondary | ICD-10-CM | POA: Diagnosis not present

## 2022-12-30 DIAGNOSIS — N3001 Acute cystitis with hematuria: Secondary | ICD-10-CM | POA: Diagnosis not present

## 2023-01-01 DIAGNOSIS — L821 Other seborrheic keratosis: Secondary | ICD-10-CM | POA: Diagnosis not present

## 2023-01-01 DIAGNOSIS — D225 Melanocytic nevi of trunk: Secondary | ICD-10-CM | POA: Diagnosis not present

## 2023-01-01 DIAGNOSIS — I788 Other diseases of capillaries: Secondary | ICD-10-CM | POA: Diagnosis not present

## 2023-01-01 DIAGNOSIS — M79642 Pain in left hand: Secondary | ICD-10-CM | POA: Diagnosis not present

## 2023-01-01 DIAGNOSIS — D485 Neoplasm of uncertain behavior of skin: Secondary | ICD-10-CM | POA: Diagnosis not present

## 2023-01-04 DIAGNOSIS — G43709 Chronic migraine without aura, not intractable, without status migrainosus: Secondary | ICD-10-CM | POA: Diagnosis not present

## 2023-01-04 DIAGNOSIS — R5383 Other fatigue: Secondary | ICD-10-CM | POA: Diagnosis not present

## 2023-01-04 DIAGNOSIS — F325 Major depressive disorder, single episode, in full remission: Secondary | ICD-10-CM | POA: Diagnosis not present

## 2023-01-05 DIAGNOSIS — N390 Urinary tract infection, site not specified: Secondary | ICD-10-CM | POA: Diagnosis not present

## 2023-01-08 DIAGNOSIS — M79642 Pain in left hand: Secondary | ICD-10-CM | POA: Diagnosis not present

## 2023-01-10 ENCOUNTER — Ambulatory Visit: Payer: BC Managed Care – PPO | Admitting: Internal Medicine

## 2023-01-11 DIAGNOSIS — M79642 Pain in left hand: Secondary | ICD-10-CM | POA: Diagnosis not present

## 2023-01-24 DIAGNOSIS — M1812 Unilateral primary osteoarthritis of first carpometacarpal joint, left hand: Secondary | ICD-10-CM | POA: Diagnosis not present

## 2023-01-24 DIAGNOSIS — M79642 Pain in left hand: Secondary | ICD-10-CM | POA: Diagnosis not present

## 2023-01-25 DIAGNOSIS — F4389 Other reactions to severe stress: Secondary | ICD-10-CM | POA: Diagnosis not present

## 2023-01-29 DIAGNOSIS — M79642 Pain in left hand: Secondary | ICD-10-CM | POA: Diagnosis not present

## 2023-01-29 DIAGNOSIS — B999 Unspecified infectious disease: Secondary | ICD-10-CM | POA: Diagnosis not present

## 2023-01-30 ENCOUNTER — Ambulatory Visit (INDEPENDENT_AMBULATORY_CARE_PROVIDER_SITE_OTHER): Payer: BC Managed Care – PPO | Admitting: Internal Medicine

## 2023-01-30 VITALS — BP 116/74 | HR 62 | Temp 97.8°F | Wt 194.8 lb

## 2023-01-30 DIAGNOSIS — B999 Unspecified infectious disease: Secondary | ICD-10-CM | POA: Diagnosis not present

## 2023-01-30 DIAGNOSIS — J310 Chronic rhinitis: Secondary | ICD-10-CM

## 2023-01-30 DIAGNOSIS — M797 Fibromyalgia: Secondary | ICD-10-CM | POA: Diagnosis not present

## 2023-01-30 DIAGNOSIS — G43819 Other migraine, intractable, without status migrainosus: Secondary | ICD-10-CM

## 2023-01-30 MED ORDER — IPRATROPIUM BROMIDE 0.06 % NA SOLN: 2.0000 | Freq: Three times a day (TID) | NASAL | 5 refills | Status: DC | PRN

## 2023-01-30 NOTE — Progress Notes (Signed)
FOLLOW UP Date of Service/Encounter:  01/30/23   Subjective:  Marisa Hamilton (DOB: 25-Dec-1960) is a 62 y.o. female who [facility] for follow up for chronic rhinitis, recurrent infections.   History obtained from: chart review and patient. Last visit was on 7/16/22024 with me and at the time, her immune workup showed low S pneumo titers so we did discuss Pneumovax. Given 12/15/2022.   SPT was negative to all allergens.  Discussed use of Nasacort/Azelastine.  Still with frontal headaches, congestion, drainage.  Not sure if nasacort, azelastine, zyrtec is helping.  She is followed by Neuro for migraines.  Also reports having trouble with chronic fatigue syndrome/fibromyalgia and stress. Follows with psychiatry and therapist who are helping her.  Just finds it difficult to get her day started.  Does have strong family and friend support.  Has noted her symptoms improve when she is around her friends and doing something.   Past Medical History: Past Medical History:  Diagnosis Date   Anxiety    Back pain    CFS (chronic fatigue syndrome)    Concussion 03/17/2017   Constipation    Cystitis, interstitial    HAD BLADDER SX FOR SAME   Depression    Fainting spell    Fibromyalgia    Food allergy    Foot pain    GERD (gastroesophageal reflux disease)    Headache(784.0)    IBS (irritable bowel syndrome)    TAKES PROBIOTICS   Insomnia    Lactose intolerance    Leg pain    Migraine    MVP (mitral valve prolapse)    Nausea    Neck pain    PONV (postoperative nausea and vomiting)    Recurrent upper respiratory infection (URI)    Restless leg syndrome    Stomach ache    Ulcerative colitis (HCC)    Varicose veins     Objective:  BP 116/74   Pulse 62   Temp 97.8 F (36.6 C) (Temporal)   Wt 194 lb 12.8 oz (88.4 kg)   LMP 07/29/2012 Comment: spotting that week  SpO2 95%   BMI 33.26 kg/m  Body mass index is 33.26 kg/m. Physical  Exam: GEN: alert, well developed HEENT: clear conjunctiva, TM grey and translucent, nose without inferior turbinate hypertrophy, pink nasal mucosa,no rhinorrhea, no cobblestoning HEART: regular rate and rhythm, no murmur LUNGS: clear to auscultation bilaterally, no coughing, unlabored respiration SKIN: no rashes or lesions   Assessment:   1. Chronic rhinitis   2. Recurrent infections   3. Other migraine without status migrainosus, intractable   4. Fibromyalgia     Plan/Recommendations:  Chronic Rhinitis Migraines Fibromyalgia  - I am not convinced that her sinus or allergies are contributing to this front facial pain.  Skin testing was negative and ENT visits showed clear CT scan.  Discussed this could be her migraines.  Still having some post nasal drip so will try Ipratropium but has not had any improvement with other nose sprays/anti histamines so we will stop this.  We also discussed stress control with spending more time with family/friends and being more active.  - Positive skin test 11/2022: negative.  - Avoidance measures discussed. - Use Ipratropium 1-2 spray each nostril three times daily as needed for runny nose, drainage.   - Stop oral anti histamines, Nasacort, saline rinses, Azelastine since they are not helping.  - Continue follow up with Neurology for Migraines.   Recurrent Infection -  11/2022: Normal Ig and protein vaccine response with tetanus/diptheria.  Low S pneumo titers.  Pneumovax give 12/15/2022.  Recheck S pneumo titers, labs pending.     Return in about 3 months (around 05/01/2023).  Alesia Morin, MD [facility]

## 2023-01-30 NOTE — Patient Instructions (Addendum)
Chronic Rhinitis Migraines  - Positive skin test 11/2022: negative. - Use Ipratropium 1-2 spray each nostril three times daily as needed for runny nose, drainage.   - Stop oral anti histamines, Nasacort, saline rinses, Azelastine since they are not helping.  - Continue follow up with Neurology for Migraines.  - Discussed working on stress control and being more active and social interactions.  Recurrent Infection - 11/2022: Normal Ig and protein vaccine response with tetanus/diptheria.  Low S pneumo titers.  Pneumovax give 12/15/2022.  Recheck S pneumo titers, labs pending.

## 2023-02-01 DIAGNOSIS — M79642 Pain in left hand: Secondary | ICD-10-CM | POA: Diagnosis not present

## 2023-02-02 ENCOUNTER — Other Ambulatory Visit: Payer: Self-pay

## 2023-02-02 DIAGNOSIS — F3342 Major depressive disorder, recurrent, in full remission: Secondary | ICD-10-CM

## 2023-02-02 MED ORDER — LAMOTRIGINE 100 MG PO TABS
100.0000 mg | ORAL_TABLET | Freq: Every day | ORAL | 1 refills | Status: DC
Start: 2023-02-02 — End: 2023-03-05

## 2023-02-03 LAB — STREP PNEUMONIAE 23 SEROTYPES IGG
Pneumo Ab Type 1*: >17.4 ug/mL (ref >1.3)
Pneumo Ab Type 12 (12F)*: 0.3 ug/mL — ABNORMAL LOW (ref >1.3)
Pneumo Ab Type 14*: 20.2 ug/mL (ref >1.3)
Pneumo Ab Type 17 (17F)*: 21.4 ug/mL (ref >1.3)
Pneumo Ab Type 19 (19F)*: 6.8 ug/mL (ref >1.3)
Pneumo Ab Type 2*: 5.9 ug/mL (ref >1.3)
Pneumo Ab Type 20*: 7.1 ug/mL (ref >1.3)
Pneumo Ab Type 22 (22F)*: 2.1 ug/mL (ref >1.3)
Pneumo Ab Type 23 (23F)*: 3.1 ug/mL (ref >1.3)
Pneumo Ab Type 26 (6B)*: <0.1 ug/mL — ABNORMAL LOW (ref >1.3)
Pneumo Ab Type 3*: 0.2 ug/mL — ABNORMAL LOW (ref >1.3)
Pneumo Ab Type 34 (10A)*: 8.6 ug/mL (ref >1.3)
Pneumo Ab Type 4*: 6.8 ug/mL (ref >1.3)
Pneumo Ab Type 43 (11A)*: 1.7 ug/mL (ref >1.3)
Pneumo Ab Type 5*: 7.7 ug/mL (ref >1.3)
Pneumo Ab Type 51 (7F)*: 0.3 ug/mL — ABNORMAL LOW (ref >1.3)
Pneumo Ab Type 54 (15B)*: >49.3 ug/mL (ref >1.3)
Pneumo Ab Type 56 (18C)*: 0.6 ug/mL — ABNORMAL LOW (ref >1.3)
Pneumo Ab Type 57 (19A)*: 5.5 ug/mL (ref >1.3)
Pneumo Ab Type 68 (9V)*: 1.3 ug/mL — ABNORMAL LOW (ref >1.3)
Pneumo Ab Type 70 (33F)*: 6.1 ug/mL (ref >1.3)
Pneumo Ab Type 8*: 4.7 ug/mL (ref >1.3)
Pneumo Ab Type 9 (9N)*: 1.1 ug/mL — ABNORMAL LOW (ref >1.3)

## 2023-02-06 DIAGNOSIS — M79642 Pain in left hand: Secondary | ICD-10-CM | POA: Diagnosis not present

## 2023-02-08 DIAGNOSIS — M79642 Pain in left hand: Secondary | ICD-10-CM | POA: Diagnosis not present

## 2023-02-12 ENCOUNTER — Ambulatory Visit (INDEPENDENT_AMBULATORY_CARE_PROVIDER_SITE_OTHER): Payer: BC Managed Care – PPO | Admitting: Gastroenterology

## 2023-02-12 ENCOUNTER — Encounter: Payer: Self-pay | Admitting: Gastroenterology

## 2023-02-12 ENCOUNTER — Ambulatory Visit: Payer: BC Managed Care – PPO | Admitting: Obstetrics and Gynecology

## 2023-02-12 VITALS — BP 126/70 | HR 77 | Ht 64.0 in | Wt 196.0 lb

## 2023-02-12 DIAGNOSIS — R194 Change in bowel habit: Secondary | ICD-10-CM

## 2023-02-12 DIAGNOSIS — K219 Gastro-esophageal reflux disease without esophagitis: Secondary | ICD-10-CM | POA: Diagnosis not present

## 2023-02-12 DIAGNOSIS — R1310 Dysphagia, unspecified: Secondary | ICD-10-CM | POA: Diagnosis not present

## 2023-02-12 NOTE — Progress Notes (Signed)
Chief Complaint: Esophagitis attack Primary GI MD: Dr. Lavon Paganini  HPI: 62 year old female with medical history as listed below presents for evaluation of her esophagitis attack that occurred July 7th (with two other attacks within the last year).  Discussed the use of AI scribe software for clinical note transcription with the patient, who gave verbal consent to proceed.  History of Present Illness   The patient presents with concerns about recurrent esophagitis attacks. They report having had three attacks since 2023, with the most recent one occurring on July 7th. The attacks are described as extremely painful, with the patient experiencing a burning sensation in the chest, throat closure, and abdominal pain. Following each attack, the patient develops a urinary tract infection that lasts for several weeks.  In addition to the esophagitis attacks, the patient also reports chronic gastrointestinal issues, including frequent diarrhea and constipation. They describe their stools as thin and incomplete, and they often resort to taking Imodium and Miralax to manage their symptoms. The patient also reports significant fatigue, which they believe is related to their gut issues.  The patient is currently on 40mg  of omeprazole in the morning and 20mg  of Pepcid at night, as prescribed by their previous healthcare provider. They report no recent esophagitis attacks or swallowing difficulties since starting this regimen.  The patient also mentions significant stress in their life, particularly related to their relationship with their mother. They believe this stress may be contributing to their health issues. They also report a weight gain of 50 pounds since starting antidepressant medication, which has further impacted their quality of life.       PREVIOUS GI WORKUP   CT chest abdomen pelvis January 2023 with contrast showed a 9 mm nodule in the left lobe of the thyroid, and a heterogeneous nodule  measuring 1.1 cm anteriorly to the left lobe.  Mild general thickening in the distal half of the thoracic esophagus without masslike thickening.  Hepatic cyst again seen measuring 5.2 x 3.2 x 4.4 cm slightly larger than the previous measurements but no other suspicious features    Follow up MRI 01/2022 showed stable 5.3cm benign hepatic lesion, benign appearing renal cysts less than a cm, No further imaging recommended  November 2021 at that time for evaluation of constipation alternating diarrhea.   She was scheduled for colonoscopy  and EGD . A mild Schatzki's ring was found and dilated, EGD otherwise normal.  A few polyps were removed from the colon, a few diverticula were found in the sigmoid colon   Past Medical History:  Diagnosis Date   Anxiety    Back pain    CFS (chronic fatigue syndrome)    Concussion 03/17/2017   Constipation    Cystitis, interstitial    HAD BLADDER SX FOR SAME   Depression    Fainting spell    Fibromyalgia    Food allergy    Foot pain    GERD (gastroesophageal reflux disease)    Headache(784.0)    IBS (irritable bowel syndrome)    TAKES PROBIOTICS   Insomnia    Lactose intolerance    Leg pain    Migraine    MVP (mitral valve prolapse)    Nausea    Neck pain    PONV (postoperative nausea and vomiting)    Recurrent upper respiratory infection (URI)    Restless leg syndrome    Stomach ache    Ulcerative colitis (HCC)    Varicose veins     Past Surgical  History:  Procedure Laterality Date   ABDOMINOPLASTY  4/14   BLADDER SURGERY     FOR IC   BREAST BIOPSY Left 02-26-12   negative   BREAST REDUCTION SURGERY  10/10/2011   Procedure: MAMMARY REDUCTION  (BREAST);  Surgeon: Karie Fetch, MD;  Location: [facility];  Service: Plastics;  Laterality: Bilateral;   COLONOSCOPY  10/22/2015   Dr.Mann   LASIK     NASAL SEPTUM SURGERY     REDUCTION MAMMAPLASTY Bilateral    VASCULAR SURGERY     VARICOSE VEINS    Current Outpatient  Medications  Medication Sig Dispense Refill   albuterol (VENTOLIN HFA) 108 (90 Base) MCG/ACT inhaler Inhale 1-2 puffs into the lungs every 4 (four) hours as needed.     clonazePAM (KLONOPIN) 1 MG tablet Take 1 tablet (1 mg total) by mouth at bedtime. 90 tablet 1   cloNIDine (CATAPRES) 0.1 MG tablet 1 daily prn anxiety (Patient taking differently: Take 0.1 mg by mouth daily as needed. 1 daily prn anxiety) 15 tablet 0   cyanocobalamin (VITAMIN B12) 1000 MCG tablet Take 1,000 mcg by mouth daily.     dicyclomine (BENTYL) 10 MG capsule Take 1 capsule (10 mg total) by mouth 2 (two) times daily as needed for spasms. 30 capsule 0   famotidine (PEPCID) 20 MG tablet Take 20 mg by mouth daily as needed for heartburn or indigestion. Pt taking in AM and PM     ipratropium (ATROVENT) 0.06 % nasal spray Place 2 sprays into both nostrils 3 (three) times daily as needed. 15 mL 5   ketorolac (TORADOL) 60 MG/2ML SOLN injection Inject 60 mg into the muscle as needed (migraines).      lamoTRIgine (LAMICTAL) 100 MG tablet Take 1 tablet (100 mg total) by mouth daily. 90 tablet 1   Lasmiditan Succinate (REYVOW) 100 MG TABS Take 100 mg by mouth as needed.     omeprazole (PRILOSEC) 40 MG capsule TAKE 1 CAPSULE BY MOUTH EVERY DAY 90 capsule 3   ondansetron (ZOFRAN-ODT) 4 MG disintegrating tablet Take 1 tablet (4 mg total) by mouth every 8 (eight) hours as needed for nausea or vomiting. 20 tablet 0   rizatriptan (MAXALT) 10 MG tablet Take by mouth.     sertraline (ZOLOFT) 25 MG tablet TAKE 1 AND 1/2 TABLETS BY MOUTH EVERY DAY 135 tablet 0   sucralfate (CARAFATE) 1 g tablet TAKE 1 TABLET BY MOUTH 4 TIMES DAILY WITH MEALS AND AT BEDTIME AS NEEDED 42 tablet 0   topiramate (TOPAMAX) 50 MG tablet Take 1 tablet (50 mg total) by mouth 2 (two) times daily. (Patient taking differently: Take 75 mg by mouth every evening.) 60 tablet 0   traZODone (DESYREL) 100 MG tablet Take 1 tablet (100 mg total) by mouth at bedtime. 90 tablet 1    Vitamin D, Ergocalciferol, (DRISDOL) 1.25 MG (50000 UNIT) CAPS capsule Take 1 capsule by mouth every 7 (seven) days.     Wheat Dextrin (BENEFIBER PO) Take by mouth.     chlorproMAZINE (THORAZINE) 25 MG tablet PRN (Patient not taking: Reported on 02/12/2023)     diazepam (VALIUM) 5 MG tablet Take 5 mg by mouth as needed. (Patient not taking: Reported on 02/12/2023)     ondansetron (ZOFRAN) 4 MG tablet TAKE 1 TABLET BY MOUTH EVERY 8 HOURS AS NEEDED FOR NAUSEA AND VOMITING (Patient not taking: Reported on 02/12/2023) 20 tablet 0   simethicone (MYLICON) 125 MG chewable tablet Chew 125 mg by mouth  every 6 (six) hours as needed for flatulence. (Patient not taking: Reported on 01/30/2023)     tiZANidine (ZANAFLEX) 4 MG tablet Take 1 tablet (4 mg total) by mouth at bedtime. (Patient not taking: Reported on 02/12/2023) 30 tablet 1   No current facility-administered medications for this visit.    Allergies as of 02/12/2023 - Review Complete 02/12/2023  Allergen Reaction Noted   Selenium dioxide [selenium] Anaphylaxis 06/01/2021   Gluten meal Other (See Comments) 06/10/2019   Milk (cow) Other (See Comments) 06/10/2019   Nsaids Other (See Comments) 04/29/2013   Phentermine Other (See Comments) 03/11/2019   Amitriptyline hcl Other (See Comments) 07/11/2021   Lactase-lactobacillus Other (See Comments) 07/11/2021   Meloxicam Nausea And Vomiting 01/04/2021   Milk-related compounds Other (See Comments) 06/10/2019    Family History  Problem Relation Age of Onset   Osteoporosis Mother    Atrial fibrillation Mother    Heart disease Mother    Stroke Mother    Depression Mother    Anxiety disorder Mother    Obesity Mother    Angioedema Father    Diabetes Father    Hypertension Father    Depression Father    Obesity Father    Migraines Brother        CHRONIC   Breast cancer Maternal Grandmother    Cancer Maternal Grandmother        ovarian   Diabetes Paternal Grandmother    Colon cancer Neg Hx     Esophageal cancer Neg Hx    Pancreatic cancer Neg Hx    Stomach cancer Neg Hx    Liver disease Neg Hx    Rectal cancer Neg Hx    Colon polyps Neg Hx     Social History   Socioeconomic History   Marital status: Married    Spouse name: Retail buyer   Number of children: Not on file   Years of education: Not on file   Highest education level: Not on file  Occupational History   Occupation: Retired Magazine features editor  Tobacco Use   Smoking status: Never   Smokeless tobacco: Never  Vaping Use   Vaping status: Never Used  Substance and Sexual Activity   Alcohol use: Not Currently   Drug use: No   Sexual activity: Yes    Partners: Male    Birth control/protection: Other-see comments, Post-menopausal    Comment: husband vasectomy  Other Topics Concern   Not on file  Social History Narrative   Not on file   Social Determinants of Health   Financial Resource Strain: Not on file  Food Insecurity: No Food Insecurity (05/17/2021)   Received from [facility], [facility]   Hunger Vital Sign    Worried About Running Out of Food in the Last Year: Never true    Ran Out of Food in the Last Year: Never true  Transportation Needs: Not on file  Physical Activity: Not on file  Stress: Not on file  Social Connections: Unknown (09/25/2021)   Received from [facility], [facility]   Social Network    Social Network: Not on file  Intimate Partner Violence: Unknown (08/18/2021)   Received from [facility], [facility]   HITS    Physically Hurt: Not on file    Insult or Talk Down To: Not on file    Threaten Physical Harm: Not on file    Scream or Curse: Not on file    Review of Systems:    Constitutional: No weight loss,  fever, chills, weakness or fatigue HEENT: Eyes: No change in vision               Ears, Nose, Throat:  No change in hearing or congestion Skin: No rash or itching Cardiovascular: No chest pain, chest pressure or palpitations   Respiratory: No SOB or  cough Gastrointestinal: See HPI and otherwise negative Genitourinary: No dysuria or change in urinary frequency Neurological: No headache, dizziness or syncope Musculoskeletal: No new muscle or joint pain Hematologic: No bleeding or bruising Psychiatric: No history of depression or anxiety    Physical Exam:  Vital signs: BP 126/70   Pulse 77   Ht 5\' 4"  (1.626 m)   Wt 88.9 kg   LMP 07/29/2012 Comment: spotting that week  BMI 33.64 kg/m   Constitutional: NAD, Well developed, Well nourished, alert and cooperative Head:  Normocephalic and atraumatic. Eyes:   PEERL, EOMI. No icterus. Conjunctiva pink. Respiratory: Respirations even and unlabored. Lungs clear to auscultation bilaterally.   No wheezes, crackles, or rhonchi.  Cardiovascular:  Regular rate and rhythm. No peripheral edema, cyanosis or pallor.  Gastrointestinal:  Soft, nondistended, nontender. No rebound or guarding. Normal bowel sounds. No appreciable masses or hepatomegaly. Rectal:  Not performed.  Msk:  Symmetrical without gross deformities. Without edema, no deformity or joint abnormality.  Neurologic:  Alert and  oriented x4;  grossly normal neurologically.  Skin:   Dry and intact without significant lesions or rashes. Psychiatric: Oriented to person, place and time. Demonstrates good judgement and reason without abnormal affect or behaviors.  Physical Exam   CARDIOVASCULAR: Heart sounds normal ABDOMEN: Bowel sounds normal, no tenderness on palpation EXTREMITIES: No swelling in legs       RELEVANT LABS AND IMAGING: CBC    Component Value Date/Time   WBC 11.8 (H) 12/30/2021 0921   RBC 4.82 12/30/2021 0921   HGB 13.6 12/30/2021 0921   HGB 14.0 10/06/2019 1711   HGB 13.8 07/30/2013 1333   HCT 40.7 12/30/2021 0921   HCT 41.4 10/06/2019 1711   PLT 260 12/30/2021 0921   PLT 265 10/06/2019 1711   MCV 84.4 12/30/2021 0921   MCV 89 10/06/2019 1711   MCH 28.2 12/30/2021 0921   MCHC 33.4 12/30/2021 0921   RDW  13.0 12/30/2021 0921   RDW 13.4 10/06/2019 1711   LYMPHSABS 2.3 10/06/2019 1711   MONOABS 0.6 12/10/2013 1426   EOSABS 0.3 10/06/2019 1711   BASOSABS 0.1 10/06/2019 1711    CMP     Component Value Date/Time   NA 138 12/30/2021 0921   NA 139 10/06/2019 1711   K 3.9 12/30/2021 0921   CL 105 12/30/2021 0921   CO2 22 12/30/2021 0921   GLUCOSE 99 12/30/2021 0921   BUN 15 12/30/2021 0921   BUN 28 (H) 10/06/2019 1711   CREATININE 1.03 (H) 12/30/2021 0921   CREATININE 0.87 08/04/2013 1100   CALCIUM 9.0 12/30/2021 0921   PROT 7.5 12/30/2021 0921   PROT 6.7 10/06/2019 1711   ALBUMIN 4.2 12/30/2021 0921   ALBUMIN 4.2 10/06/2019 1711   AST 15 12/30/2021 0921   ALT 15 12/30/2021 0921   ALKPHOS 76 12/30/2021 0921   BILITOT 0.5 12/30/2021 0921   BILITOT <0.2 10/06/2019 1711   GFRNONAA >60 12/30/2021 0921   GFRAA 104 10/06/2019 1711     Assessment/Plan:      GERD Recurrent episodes of "esophagitis" with severe chest and abdominal pain. Currently on Omeprazole 40mg  daily and Pepcid 20mg  at night. No current symptoms of  dysphagia or chest pain. Patient was tearful during visit as this causes fear for her -Continue Omeprazole 40mg  daily and Pepcid 20mg  at night. -Schedule an endoscopy for further evaluation - I thoroughly discussed the procedure with the patient (at bedside) to include nature of the procedure, alternatives, benefits, and risks (including but not limited to bleeding, infection, perforation, anesthesia/cardiac pulmonary complications).  Patient verbalized understanding and gave verbal consent to proceed with procedure.   Altered bowel habits Frequent bowel movements with diarrhea, alternating with constipation likely secondary to Imodium use. Patient describes significant impact on quality of life. Suspect gut brain axis likely contributing as patient reports significant amount of stress as she works with her therapist and psychiatrist to improve her life. -Discontinue  Imodium and Miralax. -Start Benefiber 1 tablespoon daily. -Consider probiotic. -MyChart update in 2 week to assess response to fiber supplement. - if no improvement may consider align probiotic - also, consider Elavil though with patient on multiple psych meds it may not be appropriate and she reports significant weight gain on this medication even though it worked well for her in the past. - follow per EGD   29 minutes spent in the room with the patient during evaluation in addition to 15 minutes of documentation.  [facility] 02/12/2023, 3:52 PM  Cc: Camie Patience, FNP

## 2023-02-12 NOTE — Patient Instructions (Addendum)
You have been scheduled for an endoscopy. Please follow written instructions given to you at your visit today.  If you use inhalers (even only as needed), please bring them with you on the day of your procedure.  If you take any of the following medications, they will need to be adjusted prior to your procedure:   DO NOT TAKE 7 DAYS PRIOR TO TEST- Trulicity (dulaglutide) Ozempic, Wegovy (semaglutide) Mounjaro (tirzepatide) Bydureon Bcise (exanatide extended release)  DO NOT TAKE 1 DAY PRIOR TO YOUR TEST Rybelsus (semaglutide) Adlyxin (lixisenatide) Victoza (liraglutide) Byetta (exanatide) ___________________________________________________________________________   _______________________________________________________  If your blood pressure at your visit was 140/90 or greater, please contact your primary care physician to follow up on this.  _______________________________________________________  If you are age 64 or older, your body mass index should be between 23-30. Your Body mass index is 33.64 kg/m. If this is out of the aforementioned range listed, please consider follow up with your Primary Care Provider.  If you are age 6 or younger, your body mass index should be between 19-25. Your Body mass index is 33.64 kg/m. If this is out of the aformentioned range listed, please consider follow up with your Primary Care Provider.   ________________________________________________________  The [facility] providers would like to encourage you to use [facility] to communicate with providers for non-urgent requests or questions.  Due to long hold times on the telephone, sending your provider a message by [facility] may be a faster and more efficient way to get a response.  Please allow 48 business hours for a response.  Please remember that this is for non-urgent requests.  _______________________________________________________    There is much written and talked about possible  health problems with the use of PPI medications (omeprazole (Prilosec), esomeprazole (Nexium), rabeprazole (Aciphex), lansoprazole (Prevacid), Dexlansoprazole (Dexilant) and pantoprazole ( Protonix).  The truth is the studies that suggest health problems with the use of PPI medications only find a weak association at best with the possible diseases reported to occur like dementia, bone fracture and kidney failure and even death. Many sicker patients with multiple health problems end up on these medications for various reasons. Establishing cause and effect in medical research is extremely difficult and though I believe there are very rare occurrences in which PPI's do cause harm in these ways, the benefits of you taking your PPI outweigh these largely theoretical and sometimes directly disproven risks.  As always, I/we will strive to have you on the lowest effective dose and frequency of a medication to keep you well and with a good quality of life.    Due to recent changes in healthcare laws, you may see the results of your imaging and laboratory studies on MyChart before your provider has had a chance to review them.  We understand that in some cases there may be results that are confusing or concerning to you. Not all laboratory results come back in the same time frame and the provider may be waiting for multiple results in order to interpret others.  Please give [place] 48 hours in order for your provider to thoroughly review all the results before contacting the office for clarification of your results.   Thank you for entrusting me with your care and choosing [facility].  Bayley Leanna Sato, PA-C

## 2023-02-13 DIAGNOSIS — M79642 Pain in left hand: Secondary | ICD-10-CM | POA: Diagnosis not present

## 2023-02-19 ENCOUNTER — Ambulatory Visit: Payer: BC Managed Care – PPO | Admitting: Gastroenterology

## 2023-02-19 ENCOUNTER — Encounter: Payer: Self-pay | Admitting: Gastroenterology

## 2023-02-19 ENCOUNTER — Ambulatory Visit: Payer: BC Managed Care – PPO | Admitting: Psychiatry

## 2023-02-19 VITALS — BP 129/62 | HR 59 | Temp 97.2°F | Resp 10 | Ht 64.0 in | Wt 196.0 lb

## 2023-02-19 DIAGNOSIS — K219 Gastro-esophageal reflux disease without esophagitis: Secondary | ICD-10-CM

## 2023-02-19 DIAGNOSIS — R933 Abnormal findings on diagnostic imaging of other parts of digestive tract: Secondary | ICD-10-CM | POA: Diagnosis not present

## 2023-02-19 DIAGNOSIS — K2100 Gastro-esophageal reflux disease with esophagitis, without bleeding: Secondary | ICD-10-CM

## 2023-02-19 DIAGNOSIS — F419 Anxiety disorder, unspecified: Secondary | ICD-10-CM | POA: Diagnosis not present

## 2023-02-19 MED ORDER — SUCRALFATE 1 GM/10ML PO SUSP
1.0000 g | Freq: Two times a day (BID) | ORAL | 3 refills | Status: DC
Start: 2023-02-19 — End: 2023-02-23

## 2023-02-19 MED ORDER — SODIUM CHLORIDE 0.9 % IV SOLN: 500.0000 mL | INTRAVENOUS | Status: DC

## 2023-02-19 NOTE — Progress Notes (Signed)
Pt noted to have small area that on the left lower lip that got pinched by the bite block during the procedure. No bleeding just swollen. Instructed the patient to place some ice on it when she get home.

## 2023-02-19 NOTE — Progress Notes (Unsigned)
[facility] History and Physical   Primary Care Physician:  Camie Patience, FNP   Reason for Procedure:  GERD, abnormal CT with esophageal thickening  Plan:    EGD  with possible interventions as needed     HPI: Marisa Hamilton is a very pleasant 62 y.o. female here for EGD with esophageal thickening on CT. [name]   The risks and benefits as well as alternatives of endoscopic procedure(s) have been discussed and reviewed. All questions answered. The patient agrees to proceed.    Past Medical History:  Diagnosis Date   Anxiety    Back pain    CFS (chronic fatigue syndrome)    Concussion 03/17/2017   Constipation    Cystitis, interstitial    HAD BLADDER SX FOR SAME   Depression    Fainting spell    Food allergy    Foot pain    GERD (gastroesophageal reflux disease)    Headache(784.0)    IBS (irritable bowel syndrome)    TAKES PROBIOTICS   Insomnia    Lactose intolerance    Leg pain    Migraine    MVP (mitral valve prolapse)    Nausea    Neck pain    PONV (postoperative nausea and vomiting)    Recurrent upper respiratory infection (URI)    Restless leg syndrome    Stomach ache    Ulcerative colitis (HCC)    Varicose veins     Past Surgical History:  Procedure Laterality Date   ABDOMINOPLASTY  08/2012   BLADDER SURGERY     FOR IC   BREAST BIOPSY Left 02/26/2012   negative   BREAST REDUCTION SURGERY  10/10/2011   Procedure: MAMMARY REDUCTION  (BREAST);  Surgeon: Karie Fetch, MD;  Location: [facility];  Service: Plastics;  Laterality: Bilateral;   COLONOSCOPY  10/22/2015   Dr.Mann   LASIK     NASAL SEPTUM SURGERY     REDUCTION MAMMAPLASTY Bilateral    VASCULAR SURGERY     VARICOSE VEINS   WRIST ARTHROSCOPY WITH CARPOMETACARPEL [facility]) ARTHROPLASTY Left 10/20/2022    Prior to Admission medications   Medication Sig Start Date End Date Taking? Authorizing Provider  clonazePAM (KLONOPIN) 1 MG tablet Take 1 tablet (1 mg  total) by mouth at bedtime. 09/18/22  Yes Cottle, Steva Ready., MD  cyanocobalamin (VITAMIN B12) 1000 MCG tablet Take 1,000 mcg by mouth daily.   Yes [provider]  famotidine (PEPCID) 20 MG tablet Take 20 mg by mouth daily as needed for heartburn or indigestion. Pt taking in AM and PM   Yes [provider]  ipratropium (ATROVENT) 0.06 % nasal spray Place 2 sprays into both nostrils 3 (three) times daily as needed. 01/30/23  Yes Birder Robson, MD  lamoTRIgine (LAMICTAL) 100 MG tablet Take 1 tablet (100 mg total) by mouth daily. 02/02/23  Yes Cottle, Steva Ready., MD  omeprazole (PRILOSEC) 40 MG capsule TAKE 1 CAPSULE BY MOUTH EVERY DAY 06/08/22  Yes Chayse Gracey, Eleonore Chiquito, MD  sertraline (ZOLOFT) 25 MG tablet TAKE 1 AND 1/2 TABLETS BY MOUTH EVERY DAY 12/13/22  Yes Cottle, Steva Ready., MD  tiZANidine (ZANAFLEX) 4 MG tablet Take 1 tablet (4 mg total) by mouth at bedtime. 12/21/21  Yes Cottle, Steva Ready., MD  topiramate (TOPAMAX) 50 MG tablet Take 1 tablet (50 mg total) by mouth 2 (two) times daily. Patient taking differently: Take 75 mg by mouth every evening. 01/25/20  Yes Helane Rima,  DO  traZODone (DESYREL) 100 MG tablet Take 1 tablet (100 mg total) by mouth at bedtime. 09/18/22  Yes Cottle, Steva Ready., MD  Vitamin D, Ergocalciferol, (DRISDOL) 1.25 MG (50000 UNIT) CAPS capsule Take 1 capsule by mouth every 7 (seven) days.   Yes [provider]  Wheat Dextrin (BENEFIBER PO) Take by mouth.   Yes [provider]  albuterol (VENTOLIN HFA) 108 (90 Base) MCG/ACT inhaler Inhale 1-2 puffs into the lungs every 4 (four) hours as needed.    [provider]  chlorproMAZINE (THORAZINE) 25 MG tablet PRN Patient not taking: Reported on 02/12/2023 07/19/20   [provider]  dicyclomine (BENTYL) 10 MG capsule Take 1 capsule (10 mg total) by mouth 2 (two) times daily as needed for spasms. 11/23/22   Napoleon Form, MD  ketorolac (TORADOL) 60 MG/2ML SOLN injection  Inject 60 mg into the muscle as needed (migraines).  11/06/19   [provider]  Lasmiditan Succinate (REYVOW) 100 MG TABS Take 100 mg by mouth as needed.    [provider]  ondansetron (ZOFRAN) 4 MG tablet TAKE 1 TABLET BY MOUTH EVERY 8 HOURS AS NEEDED FOR NAUSEA AND VOMITING Patient not taking: Reported on 02/12/2023 07/21/21   Napoleon Form, MD  rizatriptan (MAXALT) 10 MG tablet Take by mouth. 05/17/21   [provider]  simethicone (MYLICON) 125 MG chewable tablet Chew 125 mg by mouth every 6 (six) hours as needed for flatulence. Patient not taking: Reported on 01/30/2023    [provider]  sucralfate (CARAFATE) 1 g tablet TAKE 1 TABLET BY MOUTH 4 TIMES DAILY WITH MEALS AND AT BEDTIME AS NEEDED 12/12/22   Napoleon Form, MD    Current Outpatient Medications  Medication Sig Dispense Refill   clonazePAM (KLONOPIN) 1 MG tablet Take 1 tablet (1 mg total) by mouth at bedtime. 90 tablet 1   cyanocobalamin (VITAMIN B12) 1000 MCG tablet Take 1,000 mcg by mouth daily.     famotidine (PEPCID) 20 MG tablet Take 20 mg by mouth daily as needed for heartburn or indigestion. Pt taking in AM and PM     ipratropium (ATROVENT) 0.06 % nasal spray Place 2 sprays into both nostrils 3 (three) times daily as needed. 15 mL 5   lamoTRIgine (LAMICTAL) 100 MG tablet Take 1 tablet (100 mg total) by mouth daily. 90 tablet 1   omeprazole (PRILOSEC) 40 MG capsule TAKE 1 CAPSULE BY MOUTH EVERY DAY 90 capsule 3   sertraline (ZOLOFT) 25 MG tablet TAKE 1 AND 1/2 TABLETS BY MOUTH EVERY DAY 135 tablet 0   tiZANidine (ZANAFLEX) 4 MG tablet Take 1 tablet (4 mg total) by mouth at bedtime. 30 tablet 1   topiramate (TOPAMAX) 50 MG tablet Take 1 tablet (50 mg total) by mouth 2 (two) times daily. (Patient taking differently: Take 75 mg by mouth every evening.) 60 tablet 0   traZODone (DESYREL) 100 MG tablet Take 1 tablet (100 mg total) by mouth at bedtime. 90 tablet 1   Vitamin D,  Ergocalciferol, (DRISDOL) 1.25 MG (50000 UNIT) CAPS capsule Take 1 capsule by mouth every 7 (seven) days.     Wheat Dextrin (BENEFIBER PO) Take by mouth.     albuterol (VENTOLIN HFA) 108 (90 Base) MCG/ACT inhaler Inhale 1-2 puffs into the lungs every 4 (four) hours as needed.     chlorproMAZINE (THORAZINE) 25 MG tablet PRN (Patient not taking: Reported on 02/12/2023)     dicyclomine (BENTYL) 10 MG capsule Take 1  capsule (10 mg total) by mouth 2 (two) times daily as needed for spasms. 30 capsule 0   ketorolac (TORADOL) 60 MG/2ML SOLN injection Inject 60 mg into the muscle as needed (migraines).      Lasmiditan Succinate (REYVOW) 100 MG TABS Take 100 mg by mouth as needed.     ondansetron (ZOFRAN) 4 MG tablet TAKE 1 TABLET BY MOUTH EVERY 8 HOURS AS NEEDED FOR NAUSEA AND VOMITING (Patient not taking: Reported on 02/12/2023) 20 tablet 0   rizatriptan (MAXALT) 10 MG tablet Take by mouth.     simethicone (MYLICON) 125 MG chewable tablet Chew 125 mg by mouth every 6 (six) hours as needed for flatulence. (Patient not taking: Reported on 01/30/2023)     sucralfate (CARAFATE) 1 g tablet TAKE 1 TABLET BY MOUTH 4 TIMES DAILY WITH MEALS AND AT BEDTIME AS NEEDED 42 tablet 0   Current Facility-Administered Medications  Medication Dose Route Frequency Provider Last Rate Last Admin   0.9 %  sodium chloride infusion  500 mL Intravenous Continuous Cleve Paolillo, Eleonore Chiquito, MD        Allergies as of 02/19/2023 - Review Complete 02/19/2023  Allergen Reaction Noted   Selenium dioxide [selenium] Anaphylaxis 06/01/2021   Gluten meal Other (See Comments) 06/10/2019   Milk (cow) Other (See Comments) 06/10/2019   Nsaids Other (See Comments) 04/29/2013   Phentermine Other (See Comments) 03/11/2019   Amitriptyline hcl Other (See Comments) 07/11/2021   Lactase-lactobacillus Other (See Comments) 07/11/2021   Meloxicam Nausea And Vomiting 01/04/2021   Milk-related compounds Other (See Comments) 06/10/2019    Family History   Problem Relation Age of Onset   Osteoporosis Mother    Atrial fibrillation Mother    Heart disease Mother    Stroke Mother    Depression Mother    Anxiety disorder Mother    Obesity Mother    Angioedema Father    Diabetes Father    Hypertension Father    Depression Father    Obesity Father    Migraines Brother        CHRONIC   Breast cancer Maternal Grandmother    Cancer Maternal Grandmother        ovarian   Diabetes Paternal Grandmother    Colon cancer Neg Hx    Esophageal cancer Neg Hx    Pancreatic cancer Neg Hx    Stomach cancer Neg Hx    Liver disease Neg Hx    Rectal cancer Neg Hx    Colon polyps Neg Hx     Social History   Socioeconomic History   Marital status: Married    Spouse name: Chip Morfin   Number of children: Not on file   Years of education: Not on file   Highest education level: Not on file  Occupational History   Occupation: Retired Magazine features editor  Tobacco Use   Smoking status: Never   Smokeless tobacco: Never  Vaping Use   Vaping status: Never Used  Substance and Sexual Activity   Alcohol use: Not Currently   Drug use: No   Sexual activity: Yes    Partners: Male    Birth control/protection: Other-see comments, Post-menopausal    Comment: husband vasectomy  Other Topics Concern   Not on file  Social History Narrative   Not on file   Social Determinants of Health   Financial Resource Strain: Not on file  Food Insecurity: No Food Insecurity (05/17/2021)   Received from [facility], [facility]   Hunger Vital Sign  Worried About Programme researcher, broadcasting/film/video in the Last Year: Never true    Ran Out of Food in the Last Year: Never true  Transportation Needs: Not on file  Physical Activity: Not on file  Stress: Not on file  Social Connections: Unknown (09/25/2021)   Received from [facility], [facility]   Social Network    Social Network: Not on file  Intimate Partner Violence: Unknown (08/18/2021)   Received from [facility],  [facility]   HITS    Physically Hurt: Not on file    Insult or Talk Down To: Not on file    Threaten Physical Harm: Not on file    Scream or Curse: Not on file    Review of Systems:  All other review of systems negative except as mentioned in the HPI.  Physical Exam: Vital signs in last 24 hours: BP 133/73   Pulse 68   Temp (!) 97.2 F (36.2 C) (Skin)   Ht 5\' 4"  (1.626 m)   Wt 196 lb (88.9 kg)   LMP 07/29/2012 Comment: spotting that week  SpO2 95%   BMI 33.64 kg/m  General:   Alert, NAD Lungs:  Clear .   Heart:  Regular rate and rhythm Abdomen:  Soft, nontender and nondistended. Neuro/Psych:  Alert and cooperative. Normal mood and affect. A and O x 3  Reviewed labs, radiology imaging, old records and pertinent past GI work up  Patient is appropriate for planned procedure(s) and anesthesia in an ambulatory setting   K. Scherry Ran , MD [phone]

## 2023-02-19 NOTE — Progress Notes (Unsigned)
Patient states there have been no changes to medical or surgical history since time of pre-visit. 

## 2023-02-19 NOTE — Progress Notes (Unsigned)
Called to room to assist during endoscopic procedure.  Patient ID and intended procedure confirmed with present staff. Received instructions for my participation in the procedure from the performing physician.  

## 2023-02-19 NOTE — Patient Instructions (Addendum)
Thank you for letting us take care of your healthcare needs today. Please see handouts given to you on Esophagitis. New Rx for Carafate Liquid Suspension sent to your Pharmacy.     YOU HAD AN ENDOSCOPIC PROCEDURE TODAY AT THE [facility]:   Refer to the procedure report that was given to you for any specific questions about what was found during the examination.  If the procedure report does not answer your questions, please call your gastroenterologist to clarify.  If you requested that your care partner not be given the details of your procedure findings, then the procedure report has been included in a sealed envelope for you to review at your convenience later.  YOU SHOULD EXPECT: Some feelings of bloating in the abdomen. Passage of more gas than usual.  Walking can help get rid of the air that was put into your GI tract during the procedure and reduce the bloating. If you had a lower endoscopy (such as a colonoscopy or flexible sigmoidoscopy) you may notice spotting of blood in your stool or on the toilet paper. If you underwent a bowel prep for your procedure, you may not have a normal bowel movement for a few days.  Please Note:  You might notice some irritation and congestion in your nose or some drainage.  This is from the oxygen used during your procedure.  There is no need for concern and it should clear up in a day or so.  SYMPTOMS TO REPORT IMMEDIATELY:   Following upper endoscopy (EGD)  Vomiting of blood or coffee ground material  New chest pain or pain under the shoulder blades  Painful or persistently difficult swallowing  New shortness of breath  Fever of 100F or higher  Black, tarry-looking stools  For urgent or emergent issues, a gastroenterologist can be reached at any hour by calling [phone]. Do not use MyChart messaging for urgent concerns.    DIET:  We do recommend a small meal at first, but then you may proceed to your regular diet.  Drink  plenty of fluids but you should avoid alcoholic beverages for 24 hours.  ACTIVITY:  You should plan to take it easy for the rest of today and you should NOT DRIVE or use heavy machinery until tomorrow (because of the sedation medicines used during the test).    FOLLOW UP: Our staff will call the number listed on your records the next business day following your procedure.  We will call around 7:15- 8:00 am to check on you and address any questions or concerns that you may have regarding the information given to you following your procedure. If we do not reach you, we will leave a message.     If any biopsies were taken you will be contacted by phone or by letter within the next 1-3 weeks.  Please call us at [phone] if you have not heard about the biopsies in 3 weeks.    SIGNATURES/CONFIDENTIALITY: You and/or your care partner have signed paperwork which will be entered into your electronic medical record.  These signatures attest to the fact that that the information above on your After Visit Summary has been reviewed and is understood.  Full responsibility of the confidentiality of this discharge information lies with you and/or your care-partner.

## 2023-02-19 NOTE — Op Note (Signed)
[facility] Patient Name: Marisa Hamilton Procedure Date: 02/19/2023 10:50 AM MRN: [id number] Endoscopist: Napoleon Form , MD, [id number] Age: 62 Referring MD:  Date of Birth: 08-Oct-1960 Gender: Female Account #: [id number] Procedure:                Upper GI endoscopy Indications:              Esophageal reflux symptoms that persist despite                            appropriate therapy, Esophageal reflux symptoms                            that recur despite appropriate therapy, Chest pain                            (non cardiac) Medicines:                Monitored Anesthesia Care Procedure:                Pre-Anesthesia Assessment:                           - Prior to the procedure, a History and Physical                            was performed, and patient medications and                            allergies were reviewed. The patient's tolerance of                            previous anesthesia was also reviewed. The risks                            and benefits of the procedure and the sedation                            options and risks were discussed with the patient.                            All questions were answered, and informed consent                            was obtained. Prior Anticoagulants: The patient has                            taken no anticoagulant or antiplatelet agents. ASA                            Grade Assessment: II - A patient with mild systemic                            disease. After reviewing the risks and benefits,  the patient was deemed in satisfactory condition to                            undergo the procedure.                           After obtaining informed consent, the endoscope was                            passed under direct vision. Throughout the                            procedure, the patient's blood pressure, pulse, and                            oxygen saturations were monitored  continuously. The                            Olympus Scope [id number] was introduced through the                            mouth, and advanced to the second part of duodenum.                            The upper GI endoscopy was accomplished without                            difficulty. The patient tolerated the procedure                            well. Scope In: Scope Out: Findings:                 LA Grade B (one or more mucosal breaks greater than                            5 mm, not extending between the tops of two mucosal                            folds) esophagitis with no bleeding was found 30 to                            38 cm from the incisors. Biopsies were obtained                            from the proximal and distal esophagus with cold                            forceps for histology of suspected eosinophilic                            esophagitis.                           The gastroesophageal  flap valve was visualized                            endoscopically and classified as Hill Grade III                            (minimal fold, loose to endoscope, hiatal hernia                            likely).                           The stomach was normal.                           The cardia and gastric fundus were normal on                            retroflexion.                           The examined duodenum was normal.                           The examined duodenum was normal. Complications:            No immediate complications. Estimated Blood Loss:     Estimated blood loss was minimal. Impression:               - LA Grade B reflux esophagitis with no bleeding.                           - Gastroesophageal flap valve classified as Hill                            Grade III (minimal fold, loose to endoscope, hiatal                            hernia likely).                           - Normal stomach.                           - Normal examined duodenum.                            - Normal examined duodenum.                           - Biopsies were taken with a cold forceps for                            evaluation of eosinophilic esophagitis. Recommendation:           - Patient has a contact number available for  emergencies. The signs and symptoms of potential                            delayed complications were discussed with the                            patient. Return to normal activities tomorrow.                            Written discharge instructions were provided to the                            patient.                           - Resume previous diet.                           - Continue present medications.                           - Await pathology results.                           - Use sucralfate suspension 1 gram PO BID PRN. Rx                            for 30 days with 3 refills                           - Return to GI office at the next available                            appointment in 3-4 months. Napoleon Form, MD 02/19/2023 11:25:44 AM This report has been signed electronically.

## 2023-02-19 NOTE — Progress Notes (Signed)
Pt resting comfortably. VSS. Airway intact. SBAR complete to RN. All questions answered.   

## 2023-02-20 ENCOUNTER — Telehealth: Payer: Self-pay | Admitting: *Deleted

## 2023-02-20 DIAGNOSIS — M79642 Pain in left hand: Secondary | ICD-10-CM | POA: Diagnosis not present

## 2023-02-20 NOTE — Telephone Encounter (Signed)
  Follow up Call-     02/19/2023    9:40 AM 07/15/2020    7:09 AM  Call back number  Post procedure Call Back phone  # [phone]  Permission to leave phone message Yes Yes     Patient questions:  Do you have a fever, pain , or abdominal swelling? No. Pain Score  0 *  Have you tolerated food without any problems? Yes.    Have you been able to return to your normal activities? Yes.    Do you have any questions about your discharge instructions: Diet   No. Medications  No. Follow up visit  No.  Do you have questions or concerns about your Care? No.  Actions: * If pain score is 4 or above: No action needed, pain <4.

## 2023-02-21 LAB — SURGICAL PATHOLOGY

## 2023-02-22 ENCOUNTER — Telehealth: Payer: Self-pay | Admitting: Gastroenterology

## 2023-02-22 DIAGNOSIS — M79642 Pain in left hand: Secondary | ICD-10-CM | POA: Diagnosis not present

## 2023-02-22 NOTE — Telephone Encounter (Signed)
[facility] is calling to have the carafate script switched to tablets because her insurance will cover that. Please advise.

## 2023-02-23 DIAGNOSIS — E349 Endocrine disorder, unspecified: Secondary | ICD-10-CM | POA: Diagnosis not present

## 2023-02-23 DIAGNOSIS — F4389 Other reactions to severe stress: Secondary | ICD-10-CM | POA: Diagnosis not present

## 2023-02-23 DIAGNOSIS — N958 Other specified menopausal and perimenopausal disorders: Secondary | ICD-10-CM | POA: Diagnosis not present

## 2023-02-23 DIAGNOSIS — M8588 Other specified disorders of bone density and structure, other site: Secondary | ICD-10-CM | POA: Diagnosis not present

## 2023-02-23 MED ORDER — SUCRALFATE 1 G PO TABS: 1.0000 g | ORAL_TABLET | Freq: Two times a day (BID) | ORAL | 3 refills | Status: DC | PRN

## 2023-02-23 NOTE — Telephone Encounter (Signed)
Updated Rx sent to [facility]. Patient to make a "slurry" using sucralfate tablet and 10 ml warm water as indicated on new rx.

## 2023-02-26 NOTE — Progress Notes (Unsigned)
62 y.o. G27P2002 Married Caucasian female here for annual exam.    Not taking Prempro for a long time. Asking about restarting HRT.    Low energy for years. May have evaluation for fibromyalgia.    Taking vit D 4000 international units.  Having esophagitis x 3.   Will see pulmonology and has seen cardiology.   Experiencing stress in her family.  Sees a therapist and pschiatry.   PCP: Camie Patience, FNP   Patient's last menstrual period was 07/29/2012.           Sexually active: Yes.    The current method of family planning is post menopausal status/vasectomy.    Exercising: Yes.     pilates Smoker:  no  OB History  Gravida Para Term Preterm AB Living  2 2 2     2   SAB IAB Ectopic Multiple Live Births          2    # Outcome Date GA Lbr Len/2nd Weight Sex Type Anes PTL Lv  2 Term     M Vag-Spont   LIV  1 Term     M Vag-Spont   LIV     Health Maintenance: Pap: 07/12/21 neg: HR HPV neg,  11-20-18 Neg:Neg HR HPV, 08-23-16 Neg, 08-06-14 Neg:Neg HR HPV  History of abnormal Pap:  no MMG: 10/13/22 Breast Density Cat C, BI-RADS CAT 3 probably benign Colonoscopy:   HM Colonoscopy          Colonoscopy (Every 3 Years) Next due on 07/16/2023    07/15/2020  COLONOSCOPY   Only the first 1 history entries have been loaded, but more history exists.           BMD:  02/23/23  Result  osteopenia. HIV: 2017 NR Hep C: 2017 neg  Immunization History  Administered Date(s) Administered   Influenza Inj Mdck Quad Pf 05/25/2018   Influenza Split 02/12/2012, 03/18/2014, 05/25/2018   Influenza Whole 02/12/2010, 02/13/2011   Influenza,inj,Quad PF,6+ Mos 01/15/2019, 01/27/2020   Influenza,inj,quad, With Preservative 05/19/2015   Janssen (J&J) SARS-COV-2 Vaccination 08/01/2019, 04/27/2020   Tdap 03/25/2008, 11/20/2018   Zoster, Live 02/12/2012      reports that she has never smoked. She has never used smokeless tobacco. She reports that she does not currently use alcohol. She  reports that she does not use drugs.  Past Medical History:  Diagnosis Date   Anxiety    Back pain    CFS (chronic fatigue syndrome)    Concussion 03/17/2017   Constipation    Cystitis, interstitial    HAD BLADDER SX FOR SAME   Depression    Fainting spell    Food allergy    Foot pain    GERD (gastroesophageal reflux disease)    Headache(784.0)    IBS (irritable bowel syndrome)    TAKES PROBIOTICS   Insomnia    Lactose intolerance    Leg pain    Migraine    MVP (mitral valve prolapse)    Nausea    Neck pain    PONV (postoperative nausea and vomiting)    Recurrent upper respiratory infection (URI)    Restless leg syndrome    Stomach ache    Ulcerative colitis (HCC)    Varicose veins     Past Surgical History:  Procedure Laterality Date   ABDOMINOPLASTY  08/2012   BLADDER SURGERY     FOR IC   BREAST BIOPSY Left 02/26/2012   negative   BREAST REDUCTION SURGERY  10/10/2011   Procedure: MAMMARY REDUCTION  (BREAST);  Surgeon: Karie Fetch, MD;  Location: [facility];  Service: Plastics;  Laterality: Bilateral;   COLONOSCOPY  10/22/2015   Dr.Mann   LASIK     NASAL SEPTUM SURGERY     REDUCTION MAMMAPLASTY Bilateral    VASCULAR SURGERY     VARICOSE VEINS   WRIST ARTHROSCOPY WITH CARPOMETACARPEL [facility]) ARTHROPLASTY Left 10/20/2022    Current Outpatient Medications  Medication Sig Dispense Refill   albuterol (VENTOLIN HFA) 108 (90 Base) MCG/ACT inhaler Inhale 1-2 puffs into the lungs every 4 (four) hours as needed.     Armodafinil 150 MG tablet Take 1 tablet (150 mg total) by mouth daily. 30 tablet 0   chlorproMAZINE (THORAZINE) 25 MG tablet PRN     clonazePAM (KLONOPIN) 1 MG tablet Take 1 tablet (1 mg total) by mouth at bedtime. 90 tablet 1   cyanocobalamin (VITAMIN B12) 1000 MCG tablet Take 1,000 mcg by mouth daily.     dicyclomine (BENTYL) 10 MG capsule Take 1 capsule (10 mg total) by mouth 2 (two) times daily as needed for spasms. 30 capsule 0    doxazosin (CARDURA) 2 MG tablet Take 1 tablet (2 mg total) by mouth daily. 30 tablet 1   famotidine (PEPCID) 20 MG tablet Take 20 mg by mouth daily as needed for heartburn or indigestion. Pt taking in AM and PM     ipratropium (ATROVENT) 0.06 % nasal spray Place 2 sprays into both nostrils 3 (three) times daily as needed. 15 mL 5   ketorolac (TORADOL) 60 MG/2ML SOLN injection Inject 60 mg into the muscle as needed (migraines).      lamoTRIgine (LAMICTAL) 150 MG tablet Take 1 tablet (150 mg total) by mouth daily. 90 tablet 0   Lasmiditan Succinate (REYVOW) 100 MG TABS Take 100 mg by mouth as needed.     omeprazole (PRILOSEC) 40 MG capsule TAKE 1 CAPSULE BY MOUTH EVERY DAY 90 capsule 3   ondansetron (ZOFRAN) 4 MG tablet TAKE 1 TABLET BY MOUTH EVERY 8 HOURS AS NEEDED FOR NAUSEA AND VOMITING 20 tablet 0   rizatriptan (MAXALT) 10 MG tablet Take by mouth.     sertraline (ZOLOFT) 25 MG tablet TAKE 1 AND 1/2 TABLETS BY MOUTH EVERY DAY 135 tablet 0   simethicone (MYLICON) 125 MG chewable tablet Chew 125 mg by mouth every 6 (six) hours as needed for flatulence.     sucralfate (CARAFATE) 1 g tablet Take 1 tablet (1 g total) by mouth 2 (two) times daily as needed. Dissolve 1 tablet (1 g) in 10 ml warm water and drink twice daily as needed. 60 tablet 3   tiZANidine (ZANAFLEX) 4 MG tablet Take 1 tablet (4 mg total) by mouth at bedtime. 30 tablet 1   topiramate (TOPAMAX) 50 MG tablet Take 1 tablet (50 mg total) by mouth 2 (two) times daily. (Patient taking differently: Take 75 mg by mouth every evening.) 60 tablet 0   traZODone (DESYREL) 100 MG tablet Take 1 tablet (100 mg total) by mouth at bedtime. 90 tablet 1   Vitamin D, Ergocalciferol, (DRISDOL) 1.25 MG (50000 UNIT) CAPS capsule Take 1 capsule by mouth every 7 (seven) days.     Wheat Dextrin (BENEFIBER PO) Take by mouth.     No current facility-administered medications for this visit.    Family History  Problem Relation Age of Onset   Osteoporosis  Mother    Atrial fibrillation Mother    Heart disease  Mother    Stroke Mother    Depression Mother    Anxiety disorder Mother    Obesity Mother    Angioedema Father    Diabetes Father    Hypertension Father    Depression Father    Obesity Father    Migraines Brother        CHRONIC   Breast cancer Maternal Grandmother    Cancer Maternal Grandmother        ovarian   Diabetes Paternal Grandmother    Colon cancer Neg Hx    Esophageal cancer Neg Hx    Pancreatic cancer Neg Hx    Stomach cancer Neg Hx    Liver disease Neg Hx    Rectal cancer Neg Hx    Colon polyps Neg Hx     Review of Systems  All other systems reviewed and are negative.   Exam:   BP 124/80 (BP Location: Left Arm, Patient Position: Sitting, Cuff Size: Normal)   Pulse (!) 51   Ht 5\' 5"  (1.651 m)   Wt 196 lb (88.9 kg)   LMP 07/29/2012 Comment: spotting that week  SpO2 97%   BMI 32.62 kg/m     General appearance: alert, cooperative and appears stated age Head: normocephalic, without obvious abnormality, atraumatic Neck: no adenopathy, supple, symmetrical, trachea midline and thyroid normal to inspection and palpation Lungs: clear to auscultation bilaterally Breasts: consistent with reduction, no masses or tenderness, No nipple retraction or dimpling, No nipple discharge or bleeding, No axillary adenopathy Heart: regular rate and rhythm Abdomen: soft, non-tender; no masses, no organomegaly Extremities: extremities normal, atraumatic, no cyanosis or edema Skin: skin color, texture, turgor normal. No rashes or lesions Lymph nodes: cervical, supraclavicular, and axillary nodes normal. Neurologic: grossly normal  Pelvic: External genitalia:  no lesions              No abnormal inguinal nodes palpated.              Urethra:  normal appearing urethra with no masses, tenderness or lesions              Bartholins and Skenes: normal                 Vagina: normal appearing vagina with normal color and discharge,  no lesions              Cervix: no lesions              Pap taken: no Bimanual Exam:  Uterus:  normal size, contour, position, consistency, mobility, non-tender              Adnexa: no mass, fullness, tenderness              Rectal exam: yes.  Confirms.              Anus:  normal sphincter tone, no lesions  Chaperone was present for exam:  Warren Lacy, CMA   Assessment: Well woman with GYN exam.  Status post bilateral breast reduction.   Interstitial cystitis.  Osteopenia.  Chronic fatigue.  Stress.   Plan: Mammogram screening discussed. Self breast awareness reviewed. Guidelines for Calcium, Vitamin D, regular exercise program including cardiovascular and weight bearing exercise. We discussed risks and benefits of HRT and that is not recommended to start or restart in women age 76 and above due to risks of stroke, MI, DVT, PE, and breast cancer.  Pap and HR HPV in 2028.  BMD in 2 years.

## 2023-02-28 ENCOUNTER — Encounter: Payer: Self-pay | Admitting: Obstetrics and Gynecology

## 2023-02-28 DIAGNOSIS — M79642 Pain in left hand: Secondary | ICD-10-CM | POA: Diagnosis not present

## 2023-03-02 DIAGNOSIS — M79642 Pain in left hand: Secondary | ICD-10-CM | POA: Diagnosis not present

## 2023-03-05 ENCOUNTER — Ambulatory Visit: Payer: BC Managed Care – PPO | Admitting: Psychiatry

## 2023-03-05 ENCOUNTER — Encounter: Payer: Self-pay | Admitting: Psychiatry

## 2023-03-05 DIAGNOSIS — F900 Attention-deficit hyperactivity disorder, predominantly inattentive type: Secondary | ICD-10-CM

## 2023-03-05 DIAGNOSIS — F411 Generalized anxiety disorder: Secondary | ICD-10-CM | POA: Diagnosis not present

## 2023-03-05 DIAGNOSIS — F515 Nightmare disorder: Secondary | ICD-10-CM

## 2023-03-05 DIAGNOSIS — F5105 Insomnia due to other mental disorder: Secondary | ICD-10-CM

## 2023-03-05 DIAGNOSIS — F99 Mental disorder, not otherwise specified: Secondary | ICD-10-CM

## 2023-03-05 DIAGNOSIS — M79642 Pain in left hand: Secondary | ICD-10-CM | POA: Diagnosis not present

## 2023-03-05 DIAGNOSIS — F331 Major depressive disorder, recurrent, moderate: Secondary | ICD-10-CM

## 2023-03-05 DIAGNOSIS — M797 Fibromyalgia: Secondary | ICD-10-CM

## 2023-03-05 MED ORDER — DOXAZOSIN MESYLATE 2 MG PO TABS
2.0000 mg | ORAL_TABLET | Freq: Every day | ORAL | 1 refills | Status: DC
Start: 2023-03-05 — End: 2023-04-11

## 2023-03-05 MED ORDER — ARMODAFINIL 150 MG PO TABS: 150.0000 mg | ORAL_TABLET | Freq: Every day | ORAL | 0 refills | Status: DC

## 2023-03-05 MED ORDER — LAMOTRIGINE 150 MG PO TABS
150.0000 mg | ORAL_TABLET | Freq: Every day | ORAL | 0 refills | Status: DC
Start: 2023-03-05 — End: 2023-04-25
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
## 2023-03-07 DIAGNOSIS — M1812 Unilateral primary osteoarthritis of first carpometacarpal joint, left hand: Secondary | ICD-10-CM | POA: Diagnosis not present

## 2023-03-07 DIAGNOSIS — M79642 Pain in left hand: Secondary | ICD-10-CM | POA: Diagnosis not present

## 2023-03-09 DIAGNOSIS — F4389 Other reactions to severe stress: Secondary | ICD-10-CM | POA: Diagnosis not present

## 2023-03-12 ENCOUNTER — Ambulatory Visit (INDEPENDENT_AMBULATORY_CARE_PROVIDER_SITE_OTHER): Payer: BC Managed Care – PPO | Admitting: Obstetrics and Gynecology

## 2023-03-12 VITALS — BP 124/80 | HR 51 | Ht 65.0 in | Wt 196.0 lb

## 2023-03-12 DIAGNOSIS — Z01419 Encounter for gynecological examination (general) (routine) without abnormal findings: Secondary | ICD-10-CM

## 2023-03-12 NOTE — Patient Instructions (Signed)

## 2023-03-13 ENCOUNTER — Other Ambulatory Visit (HOSPITAL_COMMUNITY): Payer: Self-pay

## 2023-03-13 DIAGNOSIS — M79642 Pain in left hand: Secondary | ICD-10-CM | POA: Diagnosis not present

## 2023-03-14 ENCOUNTER — Encounter: Payer: Self-pay | Admitting: Gastroenterology

## 2023-03-15 ENCOUNTER — Other Ambulatory Visit: Payer: Self-pay | Admitting: Psychiatry

## 2023-03-15 DIAGNOSIS — F900 Attention-deficit hyperactivity disorder, predominantly inattentive type: Secondary | ICD-10-CM

## 2023-03-15 DIAGNOSIS — F515 Nightmare disorder: Secondary | ICD-10-CM

## 2023-03-15 DIAGNOSIS — M79642 Pain in left hand: Secondary | ICD-10-CM | POA: Diagnosis not present

## 2023-03-15 DIAGNOSIS — F331 Major depressive disorder, recurrent, moderate: Secondary | ICD-10-CM

## 2023-03-16 DIAGNOSIS — F4389 Other reactions to severe stress: Secondary | ICD-10-CM | POA: Diagnosis not present

## 2023-03-19 ENCOUNTER — Ambulatory Visit: Payer: BC Managed Care – PPO | Admitting: Cardiovascular Disease

## 2023-03-19 DIAGNOSIS — M79642 Pain in left hand: Secondary | ICD-10-CM | POA: Diagnosis not present

## 2023-03-21 ENCOUNTER — Ambulatory Visit (INDEPENDENT_AMBULATORY_CARE_PROVIDER_SITE_OTHER): Payer: BC Managed Care – PPO

## 2023-03-21 ENCOUNTER — Ambulatory Visit: Payer: BC Managed Care – PPO | Attending: Cardiovascular Disease | Admitting: Cardiology

## 2023-03-21 ENCOUNTER — Ambulatory Visit: Payer: BC Managed Care – PPO | Admitting: Cardiology

## 2023-03-21 ENCOUNTER — Encounter: Payer: Self-pay | Admitting: Cardiology

## 2023-03-21 VITALS — BP 110/72 | HR 43 | Ht 65.5 in | Wt 197.6 lb

## 2023-03-21 DIAGNOSIS — R002 Palpitations: Secondary | ICD-10-CM

## 2023-03-21 DIAGNOSIS — I341 Nonrheumatic mitral (valve) prolapse: Secondary | ICD-10-CM | POA: Diagnosis not present

## 2023-03-21 DIAGNOSIS — Z79899 Other long term (current) drug therapy: Secondary | ICD-10-CM

## 2023-03-21 DIAGNOSIS — R079 Chest pain, unspecified: Secondary | ICD-10-CM | POA: Diagnosis not present

## 2023-03-21 MED ORDER — METOPROLOL TARTRATE 100 MG PO TABS: 100.0000 mg | ORAL_TABLET | Freq: Once | ORAL | 0 refills | Status: DC

## 2023-03-21 NOTE — Patient Instructions (Addendum)
Medication Instructions:  Your physician recommends that you continue on your current medications as directed. Please refer to the Current Medication list given to you today.  *If you need a refill on your cardiac medications before your next appointment, please call your pharmacy*   Lab Work: Please complete a BMET, Mag, TSH, Sed Rate and CRP in our lab or in the first floor [facility] as soon as possible.  If you have labs (blood work) drawn today and your tests are completely normal, you will receive your results only by: MyChart Message (if you have MyChart) OR A paper copy in the mail If you have any lab test that is abnormal or we need to change your treatment, we will call you to review the results.   Testing/Procedures: Your physician has requested that you have an echocardiogram. Echocardiography is a painless test that uses sound waves to create images of your heart. It provides your doctor with information about the size and shape of your heart and how well your heart's chambers and valves are working. This procedure takes approximately one hour. There are no restrictions for this procedure. Please do NOT wear cologne, perfume, aftershave, or lotions (deodorant is allowed). Please arrive 15 minutes prior to your appointment time.  Please note: We ask at that you not bring children with you during ultrasound (echo/ vascular) testing. Due to room size and safety concerns, children are not allowed in the ultrasound rooms during exams. Our front office staff cannot provide observation of children in our lobby area while testing is being conducted. An adult accompanying a patient to their appointment will only be allowed in the ultrasound room at the discretion of the ultrasound technician under special circumstances. We apologize for any inconvenience.  ZIO XT- Long Term Monitor Instructions  Your physician has requested you wear a ZIO patch monitor for 14 days.  This is a single patch  monitor. [employer] supplies one patch monitor per enrollment. Additional stickers are not available. Please do not apply patch if you will be having a Nuclear Stress Test,  Echocardiogram, Cardiac CT, MRI, or Chest Xray during the period you would be wearing the  monitor. The patch cannot be worn during these tests. You cannot remove and re-apply the  ZIO XT patch monitor.  Your ZIO patch monitor will be mailed 3 day USPS to your address on file. It may take 3-5 days  to receive your monitor after you have been enrolled.  Once you have received your monitor, please review the enclosed instructions. Your monitor  has already been registered assigning a specific monitor serial # to you.  Billing and Patient Assistance Program Information  We have supplied [employer] with any of your insurance information on file for billing purposes. [employer] offers a sliding scale Patient Assistance Program for patients that do not have  insurance, or whose insurance does not completely cover the cost of the ZIO monitor.  You must apply for the Patient Assistance Program to qualify for this discounted rate.  To apply, please call [employer] at [phone], select option 4, select option 2, ask to apply for  Patient Assistance Program. Meredeth Ide will ask your household income, and how many people  are in your household. They will quote your out-of-pocket cost based on that information.  [employer] will also be able to set up a 34-month, interest-free payment plan if needed.  Applying the monitor   Shave hair from upper left chest.  Hold abrader disc by orange tab. Rub abrader  in 40 strokes over the upper left chest as  indicated in your monitor instructions.  Clean area with 4 enclosed alcohol pads. Let dry.  Apply patch as indicated in monitor instructions. Patch will be placed under collarbone on left  side of chest with arrow pointing upward.  Rub patch adhesive wings for 2 minutes. Remove white label marked "1".  Remove the white  label marked "2". Rub patch adhesive wings for 2 additional minutes.  While looking in a mirror, press and release button in center of patch. A small green light will  flash 3-4 times. This will be your only indicator that the monitor has been turned on.  Do not shower for the first 24 hours. You may shower after the first 24 hours.  Press the button if you feel a symptom. You will hear a small click. Record Date, Time and  Symptom in the Patient Logbook.  When you are ready to remove the patch, follow instructions on the last 2 pages of Patient  Logbook. Stick patch monitor onto the last page of Patient Logbook.  Place Patient Logbook in the blue and white box. Use locking tab on box and tape box closed  securely. The blue and white box has prepaid postage on it. Please place it in the mailbox as  soon as possible. Your physician should have your test results approximately 7 days after the  monitor has been mailed back to [facility].  Call [facility] Customer Care at [phone] if you have questions regarding  your ZIO XT patch monitor. Call them immediately if you see an orange light blinking on your  monitor.  If your monitor falls off in less than 4 days, contact our Monitor department at [phone].  If your monitor becomes loose or falls off after 4 days call [employer] at [phone] for  suggestions on securing your monitor     Your cardiac CT will be scheduled at one of the below locations:   [facility] [address] [place], [place] [address] [phone]  Please arrive at the [facility] En[facility] (Entrance C2) of [facility] 30 minutes prior to test start time. You can use the FREE valet parking offered at entrance C (encouraged to control the heart rate for the test)  Proceed to the [facility] Radiology Department (first floor) to check-in and test prep.  All radiology patients and guests should use  entrance C2 at [facility], accessed from [facility], even though the hospital's physical address listed is [address]      Please follow these instructions carefully (unless otherwise directed):  An IV will be required for this test and Nitroglycerin will be given.   On the Night Before the Test: Be sure to Drink plenty of water. Do not consume any caffeinated/decaffeinated beverages or chocolate 12 hours prior to your test. Do not take any antihistamines 12 hours prior to your test.  On the Day of the Test: Drink plenty of water until 1 hour prior to the test. Do not eat any food 1 hour prior to test. You may take your regular medications prior to the test.  Take 100 mg metoprolol (Lopressor) two hours prior to test. If you take Furosemide/Hydrochlorothiazide/Spironolactone, please HOLD on the morning of the test. FEMALES- please wear underwire-free bra if available, avoid dresses & tight clothing       After the Test: Drink plenty of water. After receiving IV contrast, you may experience  a mild flushed feeling. This is normal. On occasion, you may experience a mild rash up to 24 hours after the test. This is not dangerous. If this occurs, you can take Benadryl 25 mg and increase your fluid intake. If you experience trouble breathing, this can be serious. If it is severe call 911 IMMEDIATELY. If it is mild, please call our office.  We will call to schedule your test 2-4 weeks out understanding that some insurance companies will need an authorization prior to the service being performed.   For more information and frequently asked questions, please visit our website : [email or web address]  For non-scheduling related questions, please contact the cardiac imaging nurse navigator should you have any questions/concerns: Cardiac Imaging Nurse Navigators Direct Office Dial: [phone]   For scheduling needs, including cancellations and  rescheduling, please call [place], [phone].     Follow-Up: At [facility], you and your health needs are our priority.  As part of our continuing mission to provide you with exceptional heart care, we have created designated Provider Care Teams.  These Care Teams include your primary Cardiologist (physician) and Advanced Practice Providers (APPs -  Physician Assistants and Nurse Practitioners) who all work together to provide you with the care you need, when you need it.  We recommend signing up for the patient portal called "MyChart".  Sign up information is provided on this After Visit Summary.  MyChart is used to connect with patients for Virtual Visits (Telemedicine).  Patients are able to view lab/test results, encounter notes, upcoming appointments, etc.  Non-urgent messages can be sent to your provider as well.   To learn more about what you can do with MyChart, go to [email or web address].    Your next appointment:   1 year(s)  Provider:   Dr. Armanda Magic, MD

## 2023-03-21 NOTE — Addendum Note (Signed)
Addended by: Luellen Pucker on: 03/21/2023 05:35 PM   Modules accepted: Orders

## 2023-03-21 NOTE — Progress Notes (Signed)
Cardiology CONSULT Note    Date:  03/21/2023   ID:  Marisa, Hamilton [birth year], MRN [id number]  PCP:  Camie Patience, FNP  Cardiologist:  Armanda Magic, MD   Chief Complaint  Patient presents with   New Patient (Initial Visit)    Palpitations    Patient Profile: Marisa Hamilton is a 62 y.o. female who is being seen today for the evaluation of Palpitations at the request of Camie Patience, FNP.  History of Present Illness:  Marisa Hamilton is a 62 y.o. female who is being seen today for the evaluation of palpitations at the request of Camie Patience, FNP.  This is a 62yo female with a hx of depression, IBS, UC and RLS. She is now referred for evaluation of palpitations. She tells me that she had a flare of esophagitis back in the summer and has not felt well since then.  She has started having problems with chest pain that she describes as a heaviness which has slowly gotten better.  With the attacks she will have throat discomfort but no radiation into her jaw or arms.  There is no associated diaphoresis or nausea.  She will feel SOB with it.  She will also feel her heart beating fast.  It is very sporadic and can occur daily or once weekly.  It is exertional and also occurs with emotional stress.  She has no family  Past Medical History:  Diagnosis Date   Anxiety    Back pain    CFS (chronic fatigue syndrome)    Concussion 03/17/2017   Constipation    Cystitis, interstitial    HAD BLADDER SX FOR SAME   Depression    Fainting spell    Food allergy    Foot pain    GERD (gastroesophageal reflux disease)    Headache(784.0)    IBS (irritable bowel syndrome)    TAKES PROBIOTICS   Insomnia    Lactose intolerance    Leg pain    Migraine    MVP (mitral valve prolapse)    Nausea    Neck pain    PONV (postoperative nausea and vomiting)    Recurrent upper respiratory infection (URI)    Restless leg syndrome    Stomach ache    Ulcerative colitis (HCC)     Varicose veins     Past Surgical History:  Procedure Laterality Date   ABDOMINOPLASTY  08/2012   BLADDER SURGERY     FOR IC   BREAST BIOPSY Left 02/26/2012   negative   BREAST REDUCTION SURGERY  10/10/2011   Procedure: MAMMARY REDUCTION  (BREAST);  Surgeon: Karie Fetch, MD;  Location: [facility];  Service: Plastics;  Laterality: Bilateral;   COLONOSCOPY  10/22/2015   Dr.Mann   ESOPHAGOGASTRODUODENOSCOPY ENDOSCOPY     LASIK     NASAL SEPTUM SURGERY     REDUCTION MAMMAPLASTY Bilateral    VASCULAR SURGERY     VARICOSE VEINS   WRIST ARTHROSCOPY WITH CARPOMETACARPEL [facility]) ARTHROPLASTY Left 10/20/2022    Current Medications: Current Meds  Medication Sig   Armodafinil 150 MG tablet Take 1 tablet (150 mg total) by mouth daily.   chlorproMAZINE (THORAZINE) 25 MG tablet PRN   clonazePAM (KLONOPIN) 1 MG tablet Take 1 tablet (1 mg total) by mouth at bedtime.   cyanocobalamin (VITAMIN B12) 1000 MCG tablet Take 1,000 mcg by mouth daily.   dicyclomine (BENTYL) 10 MG capsule Take 1 capsule (10 mg  total) by mouth 2 (two) times daily as needed for spasms.   doxazosin (CARDURA) 2 MG tablet Take 1 tablet (2 mg total) by mouth daily.   famotidine (PEPCID) 20 MG tablet Take 20 mg by mouth daily as needed for heartburn or indigestion. Pt taking in AM and PM   ketorolac (TORADOL) 60 MG/2ML SOLN injection Inject 60 mg into the muscle as needed (migraines).    lamoTRIgine (LAMICTAL) 150 MG tablet Take 1 tablet (150 mg total) by mouth daily.   Lasmiditan Succinate (REYVOW) 100 MG TABS Take 100 mg by mouth as needed.   omeprazole (PRILOSEC) 40 MG capsule TAKE 1 CAPSULE BY MOUTH EVERY DAY   ondansetron (ZOFRAN) 4 MG tablet TAKE 1 TABLET BY MOUTH EVERY 8 HOURS AS NEEDED FOR NAUSEA AND VOMITING   rizatriptan (MAXALT) 10 MG tablet Take by mouth.   sertraline (ZOLOFT) 25 MG tablet TAKE 1 AND 1/2 TABLETS BY MOUTH EVERY DAY   simethicone (MYLICON) 125 MG chewable tablet Chew 125 mg by  mouth every 6 (six) hours as needed for flatulence.   sucralfate (CARAFATE) 1 g tablet Take 1 tablet (1 g total) by mouth 2 (two) times daily as needed. Dissolve 1 tablet (1 g) in 10 ml warm water and drink twice daily as needed.   tiZANidine (ZANAFLEX) 4 MG tablet Take 1 tablet (4 mg total) by mouth at bedtime.   topiramate (TOPAMAX) 50 MG tablet Take 1 tablet (50 mg total) by mouth 2 (two) times daily. (Patient taking differently: Take 75 mg by mouth every evening.)   traZODone (DESYREL) 100 MG tablet Take 1 tablet (100 mg total) by mouth at bedtime.   Wheat Dextrin (BENEFIBER PO) Take by mouth.    Allergies:   Selenium dioxide [selenium], Gluten meal, Milk (cow), Nsaids, Phentermine, Amitriptyline hcl, Lactase-lactobacillus, Meloxicam, and Milk-related compounds   Social History   Socioeconomic History   Marital status: Married    Spouse name: Chip Abdelrahman   Number of children: Not on file   Years of education: Not on file   Highest education level: Not on file  Occupational History   Occupation: Retired Magazine features editor  Tobacco Use   Smoking status: Never   Smokeless tobacco: Never  Vaping Use   Vaping status: Never Used  Substance and Sexual Activity   Alcohol use: Not Currently   Drug use: No   Sexual activity: Yes    Partners: Male    Birth control/protection: Other-see comments, Post-menopausal    Comment: husband vasectomy  Other Topics Concern   Not on file  Social History Narrative   Not on file   Social Determinants of Health   Financial Resource Strain: Not on file  Food Insecurity: No Food Insecurity (05/17/2021)   Received from [facility]   Hunger Vital Sign    Worried About Running Out of Food in the Last Year: Never true    Ran Out of Food in the Last Year: Never true  Transportation Needs: Not on file  Physical Activity: Not on file  Stress: Not on file  Social Connections: Unknown (09/25/2021)   [facility]   Social Network    Social Network: Not on file     Family History:  The patient's family history includes Angioedema in her father; Anxiety disorder in her mother; Atrial fibrillation in her mother; Breast cancer in her maternal grandmother; Cancer in her maternal grandmother; Depression in her father and mother; Diabetes in her father  and paternal grandmother; Heart disease in her mother; Hypertension in her father; Migraines in her brother; Obesity in her father and mother; Osteoporosis in her mother; Stroke in her mother.   ROS:   Please see the history of present illness.    ROS All other systems reviewed and are negative.      No data to display             PHYSICAL EXAM:   VS:  BP 110/72   Pulse (!) 43   Ht 5' 5.5" (1.664 m)   Wt 197 lb 9.6 oz (89.6 kg)   LMP 07/29/2012 Comment: spotting that week  SpO2 94%   BMI 32.38 kg/m    GEN: Well nourished, well developed, in no acute distress  HEENT: normal  Neck: no JVD, carotid bruits, or masses Cardiac: RRR; no murmurs, rubs, or gallops,no edema.  Intact distal pulses bilaterally. Occasional ectopy Respiratory:  clear to auscultation bilaterally, normal work of breathing GI: soft, nontender, nondistended, + BS MS: no deformity or atrophy  Skin: warm and dry, no rash Neuro:  Alert and Oriented x 3, Strength and sensation are intact Psych: euthymic mood, full affect  Wt Readings from Last 3 Encounters:  03/21/23 197 lb 9.6 oz (89.6 kg)  03/12/23 196 lb (88.9 kg)  02/19/23 196 lb (88.9 kg)      Studies/Labs Reviewed:   EKG Interpretation Date/Time:  Wednesday March 21 2023 16:20:13 EST Ventricular Rate:  78 PR Interval:  142 QRS Duration:  78 QT Interval:  390 QTC Calculation: 444 R Axis:   51  Text Interpretation: Sinus rhythm with sinus arrhythmia with frequent Premature ventricular complexes When compared with ECG of 27-May-2021 01:57, PREVIOUS ECG IS PRESENT Confirmed by Armanda Magic [phone]) on  03/21/2023 4:21:02 PMEKG Interpretation Date/Time:  Wednesday March 21 2023 16:20:13 EST Ventricular Rate:  78 PR Interval:  142 QRS Duration:  78 QT Interval:  390 QTC Calculation: 444 R Axis:   51  Text Interpretation: Sinus rhythm with sinus arrhythmia with frequent Premature ventricular complexes When compared with ECG of 27-May-2021 01:57, PREVIOUS ECG IS PRESENT Confirmed by Armanda Magic ([id number]) on 03/21/2023 4:21:02 PM    Recent Labs: No results found for requested labs within last 365 days.   Lipid Panel    Component Value Date/Time   CHOL 187 06/10/2019 1248   TRIG 181 (H) 06/10/2019 1248   HDL 68 06/10/2019 1248   CHOLHDL 2.7 08/04/2013 1100   VLDL 34 08/04/2013 1100   LDLCALC 89 06/10/2019 1248    ASSESSMENT:    1. Palpitations   2. Chest pain of uncertain etiology   3. MVP (mitral valve prolapse)      PLAN:  In order of problems listed above:  Palpitations -12 lead EKG today shows PVCs -will get a 2 week ziopatch to assess PVC load and look for other arrhythmias -check BMET, Mag and TSH  Chest pressure -unclear etiology -no fm hx of CAD -she does have hx of esophagitis and GERD/Hiatal Hernia with recent exacerbation so could be aspirating into her lungs that also could cause CP and SOB -I will get a coronary CTA to define coronary anatomy and rule out CAD -check ESR, CRP  Hx of MVP -2D echo 2016 showed MVP of PMVL with mild MR -repeat 2D echo to make sure this is stable  Time Spent: 20 minutes total time of encounter, including 15 minutes spent in face-to-face patient care on the date of this encounter. This time  includes coordination of care and counseling regarding above mentioned problem list. Remainder of non-face-to-face time involved reviewing chart documents/testing relevant to the patient encounter and documentation in the medical record. I have independently reviewed documentation from referring provider  Followup:  PRN  Medication  Adjustments/Labs and Tests Ordered: Current medicines are reviewed at length with the patient today.  Concerns regarding medicines are outlined above.  Medication changes, Labs and Tests ordered today are listed in the Patient Instructions below.  There are no Patient Instructions on file for this visit.   Signed, Armanda Magic, MD  03/21/2023 5:19 PM    [facility] [address] [place], [place], [place]  [address] Phone: [phone]; Fax: [phone]

## 2023-03-21 NOTE — Progress Notes (Unsigned)
Enrolled patient for a 14 day Zio XT  monitor to be mailed to patients home  °

## 2023-03-22 DIAGNOSIS — M79642 Pain in left hand: Secondary | ICD-10-CM | POA: Diagnosis not present

## 2023-03-23 ENCOUNTER — Other Ambulatory Visit: Payer: Self-pay | Admitting: Psychiatry

## 2023-03-23 DIAGNOSIS — F4389 Other reactions to severe stress: Secondary | ICD-10-CM | POA: Diagnosis not present

## 2023-03-23 DIAGNOSIS — Z79899 Other long term (current) drug therapy: Secondary | ICD-10-CM | POA: Diagnosis not present

## 2023-03-23 DIAGNOSIS — R079 Chest pain, unspecified: Secondary | ICD-10-CM | POA: Diagnosis not present

## 2023-03-23 DIAGNOSIS — F411 Generalized anxiety disorder: Secondary | ICD-10-CM

## 2023-03-23 DIAGNOSIS — R002 Palpitations: Secondary | ICD-10-CM | POA: Diagnosis not present

## 2023-03-23 DIAGNOSIS — F331 Major depressive disorder, recurrent, moderate: Secondary | ICD-10-CM

## 2023-03-24 LAB — BASIC METABOLIC PANEL
BUN/Creatinine Ratio: 17 (ref 12–28)
BUN: 15 mg/dL (ref 8–27)
CO2: 21 mmol/L (ref 20–29)
Calcium: 9.2 mg/dL (ref 8.7–10.3)
Chloride: 106 mmol/L (ref 96–106)
Creatinine, Ser: 0.89 mg/dL (ref 0.57–1.00)
Glucose: 107 mg/dL — ABNORMAL HIGH (ref 70–99)
Potassium: 4.5 mmol/L (ref 3.5–5.2)
Sodium: 140 mmol/L (ref 134–144)
eGFR: 72 mL/min/{1.73_m2} (ref >59)

## 2023-03-24 LAB — SEDIMENTATION RATE: Sed Rate: 7 mm/h (ref 0–40)

## 2023-03-24 LAB — MAGNESIUM: Magnesium: 1.9 mg/dL (ref 1.6–2.3)

## 2023-03-24 LAB — C-REACTIVE PROTEIN: CRP: 5 mg/L (ref 0–10)

## 2023-03-24 LAB — TSH: TSH: 2.19 u[IU]/mL (ref 0.450–4.500)

## 2023-03-26 DIAGNOSIS — M79642 Pain in left hand: Secondary | ICD-10-CM | POA: Diagnosis not present

## 2023-03-28 ENCOUNTER — Telehealth: Payer: Self-pay

## 2023-03-28 NOTE — Telephone Encounter (Signed)
Call to patient to discuss lab results. No answer, left detailed message per DPR explaining labs were normal and to continue current medications. Asked patient to call our office if any further questions.

## 2023-03-28 NOTE — Telephone Encounter (Signed)
-----   Message from Armanda Magic sent at 03/27/2023  9:43 AM EST ----- Please let patient know that labs were normal.  Continue current medical therapy.

## 2023-03-29 ENCOUNTER — Other Ambulatory Visit: Payer: Self-pay | Admitting: Psychiatry

## 2023-03-29 DIAGNOSIS — F5105 Insomnia due to other mental disorder: Secondary | ICD-10-CM

## 2023-03-29 NOTE — Telephone Encounter (Signed)
LF 08/19; LV 10/21

## 2023-03-30 DIAGNOSIS — F4389 Other reactions to severe stress: Secondary | ICD-10-CM | POA: Diagnosis not present

## 2023-04-04 DIAGNOSIS — M79642 Pain in left hand: Secondary | ICD-10-CM | POA: Diagnosis not present

## 2023-04-05 DIAGNOSIS — R002 Palpitations: Secondary | ICD-10-CM | POA: Diagnosis not present

## 2023-04-05 DIAGNOSIS — F4389 Other reactions to severe stress: Secondary | ICD-10-CM | POA: Diagnosis not present

## 2023-04-11 ENCOUNTER — Other Ambulatory Visit: Payer: Self-pay | Admitting: Psychiatry

## 2023-04-11 DIAGNOSIS — F5105 Insomnia due to other mental disorder: Secondary | ICD-10-CM

## 2023-04-11 DIAGNOSIS — F515 Nightmare disorder: Secondary | ICD-10-CM

## 2023-04-20 DIAGNOSIS — F4389 Other reactions to severe stress: Secondary | ICD-10-CM | POA: Diagnosis not present

## 2023-04-24 ENCOUNTER — Other Ambulatory Visit (HOSPITAL_COMMUNITY): Payer: BC Managed Care – PPO

## 2023-04-25 ENCOUNTER — Other Ambulatory Visit: Payer: Self-pay | Admitting: Psychiatry

## 2023-04-25 ENCOUNTER — Ambulatory Visit: Payer: BC Managed Care – PPO | Admitting: Cardiology

## 2023-04-25 DIAGNOSIS — F331 Major depressive disorder, recurrent, moderate: Secondary | ICD-10-CM

## 2023-05-01 ENCOUNTER — Encounter: Payer: Self-pay | Admitting: Cardiology

## 2023-05-01 DIAGNOSIS — I493 Ventricular premature depolarization: Secondary | ICD-10-CM | POA: Insufficient documentation

## 2023-05-01 DIAGNOSIS — I4719 Other supraventricular tachycardia: Secondary | ICD-10-CM | POA: Insufficient documentation

## 2023-05-02 ENCOUNTER — Ambulatory Visit: Payer: BC Managed Care – PPO | Admitting: Psychiatry

## 2023-05-02 DIAGNOSIS — M79642 Pain in left hand: Secondary | ICD-10-CM | POA: Diagnosis not present

## 2023-05-04 ENCOUNTER — Telehealth: Payer: Self-pay

## 2023-05-04 ENCOUNTER — Other Ambulatory Visit: Payer: Self-pay | Admitting: Psychiatry

## 2023-05-04 DIAGNOSIS — F4389 Other reactions to severe stress: Secondary | ICD-10-CM | POA: Diagnosis not present

## 2023-05-04 DIAGNOSIS — F5105 Insomnia due to other mental disorder: Secondary | ICD-10-CM

## 2023-05-04 DIAGNOSIS — I493 Ventricular premature depolarization: Secondary | ICD-10-CM

## 2023-05-04 MED ORDER — METOPROLOL SUCCINATE ER 25 MG PO TB24: 25.0000 mg | ORAL_TABLET | Freq: Every day | ORAL | 3 refills | Status: DC

## 2023-05-04 NOTE — Telephone Encounter (Signed)
Call to patient to advise that heart monitor showed mainly normal rhythm with multiple episodes of a fast heartbeat from the top of the heart called SVT/atrial tachycardia lasting as long as 41 seconds.  There were also frequent extra heartbeats from the bottom of the heart called PVCs with elevated PVC load of 17.6%.  Coronary CTA and 2D echo are pending. Patient agrees to start toprol xl 25 mg daily and verbalizes understanding of referral to EP for increased PVC load.

## 2023-05-04 NOTE — Telephone Encounter (Signed)
-----   Message from Armanda Magic sent at 05/01/2023 10:50 AM EST ----- Please add on Toprol-XL 25 mg daily

## 2023-05-07 ENCOUNTER — Other Ambulatory Visit: Payer: Self-pay | Admitting: Psychiatry

## 2023-05-07 DIAGNOSIS — F515 Nightmare disorder: Secondary | ICD-10-CM

## 2023-05-07 DIAGNOSIS — F5105 Insomnia due to other mental disorder: Secondary | ICD-10-CM

## 2023-05-21 ENCOUNTER — Ambulatory Visit (HOSPITAL_BASED_OUTPATIENT_CLINIC_OR_DEPARTMENT_OTHER): Payer: BC Managed Care – PPO

## 2023-05-21 ENCOUNTER — Encounter: Payer: Self-pay | Admitting: Psychiatry

## 2023-05-21 ENCOUNTER — Ambulatory Visit: Payer: BC Managed Care – PPO | Admitting: Psychiatry

## 2023-05-21 DIAGNOSIS — F5105 Insomnia due to other mental disorder: Secondary | ICD-10-CM | POA: Diagnosis not present

## 2023-05-21 DIAGNOSIS — F900 Attention-deficit hyperactivity disorder, predominantly inattentive type: Secondary | ICD-10-CM

## 2023-05-21 DIAGNOSIS — F99 Mental disorder, not otherwise specified: Secondary | ICD-10-CM

## 2023-05-21 DIAGNOSIS — F331 Major depressive disorder, recurrent, moderate: Secondary | ICD-10-CM

## 2023-05-21 MED ORDER — LAMOTRIGINE 25 MG PO TABS
50.0000 mg | ORAL_TABLET | Freq: Every day | ORAL | 1 refills | Status: DC
Start: 2023-05-21 — End: 2023-06-19

## 2023-05-21 MED ORDER — CLONAZEPAM 1 MG PO TABS: 1.0000 mg | ORAL_TABLET | Freq: Every day | ORAL | 0 refills | Status: DC

## 2023-05-21 MED ORDER — AUVELITY 45-105 MG PO TBCR: 1.0000 | EXTENDED_RELEASE_TABLET | Freq: Two times a day (BID) | ORAL | 1 refills | Status: DC

## 2023-05-21 MED ORDER — ARMODAFINIL 150 MG PO TABS: 150.0000 mg | ORAL_TABLET | Freq: Every day | ORAL | 1 refills | Status: DC

## 2023-05-21 NOTE — Progress Notes (Signed)
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 FORBES, MD [facility] None    No orders of the defined types were placed in this encounter.     -------------------------------

## 2023-05-23 ENCOUNTER — Ambulatory Visit: Payer: BC Managed Care – PPO | Admitting: Internal Medicine

## 2023-05-24 ENCOUNTER — Other Ambulatory Visit: Payer: Self-pay | Admitting: Psychiatry

## 2023-05-24 DIAGNOSIS — F331 Major depressive disorder, recurrent, moderate: Secondary | ICD-10-CM

## 2023-05-29 ENCOUNTER — Encounter: Payer: Self-pay | Admitting: Cardiology

## 2023-05-29 ENCOUNTER — Ambulatory Visit (HOSPITAL_COMMUNITY): Payer: BC Managed Care – PPO | Attending: Cardiology

## 2023-05-29 DIAGNOSIS — I341 Nonrheumatic mitral (valve) prolapse: Secondary | ICD-10-CM | POA: Insufficient documentation

## 2023-05-29 LAB — ECHOCARDIOGRAM COMPLETE
Area-P 1/2: 4.16 cm2
S' Lateral: 2.9 cm

## 2023-05-30 ENCOUNTER — Telehealth: Payer: Self-pay

## 2023-05-30 DIAGNOSIS — I341 Nonrheumatic mitral (valve) prolapse: Secondary | ICD-10-CM

## 2023-05-30 NOTE — Telephone Encounter (Signed)
-----   Message from Gaylyn Keas sent at 05/29/2023  3:41 PM EST ----- Normal pumping function of the right and left sides of the heart.  The left atrium and right atrium are mild to moderately enlarged.  There is mild mitral valve prolapse of the posterior mitral valve leaflet with mild leakiness of the mitral valve.  Please repeat 2D echo in 1 year

## 2023-05-30 NOTE — Telephone Encounter (Signed)
 Call to patient to discuss echo results, per Dr. Micael Adas there is normal pumping function of the right and left sides of the heart. The left atrium and right atrium are mild to moderately enlarged. There is mild mitral valve prolapse of the posterior mitral valve leaflet with mild leakiness of the mitral valve. Patient verbalizes understanding of all and agrees to repeat echo in 1 year.

## 2023-05-31 ENCOUNTER — Encounter (HOSPITAL_COMMUNITY): Payer: Self-pay

## 2023-06-01 ENCOUNTER — Telehealth (HOSPITAL_COMMUNITY): Payer: Self-pay | Admitting: *Deleted

## 2023-06-01 NOTE — Telephone Encounter (Signed)
Attempted to call patient regarding upcoming cardiac CT appointment. °Left message on voicemail with name and callback number ° °Edie Vallandingham RN Navigator Cardiac Imaging °[facility] °[phone] Office °[phone] Cell ° °

## 2023-06-01 NOTE — Telephone Encounter (Signed)
 Reaching out to patient to offer assistance regarding upcoming cardiac imaging study; pt verbalizes understanding of appt date/time, parking situation and where to check in, pre-test NPO status and verified current allergies; name and call back number provided for further questions should they arise  Larey Brick RN Navigator Cardiac Imaging [facility] [phone] office [phone] cell  Patient aware to arrive at 2:30 PM

## 2023-06-04 ENCOUNTER — Ambulatory Visit (HOSPITAL_COMMUNITY)
Admission: RE | Admit: 2023-06-04 | Discharge: 2023-06-04 | Disposition: A | Discharge status: Home / Self Care | Payer: BC Managed Care – PPO | Source: Ambulatory Visit | Attending: Cardiology | Admitting: Cardiology

## 2023-06-04 DIAGNOSIS — R931 Abnormal findings on diagnostic imaging of heart and coronary circulation: Secondary | ICD-10-CM | POA: Insufficient documentation

## 2023-06-04 DIAGNOSIS — R079 Chest pain, unspecified: Secondary | ICD-10-CM | POA: Insufficient documentation

## 2023-06-04 DIAGNOSIS — I2510 Atherosclerotic heart disease of native coronary artery without angina pectoris: Secondary | ICD-10-CM | POA: Diagnosis not present

## 2023-06-04 MED ORDER — NITROGLYCERIN 0.4 MG SL SUBL
SUBLINGUAL_TABLET | SUBLINGUAL | Status: AC
  Filled 2023-06-04: qty 2

## 2023-06-04 MED ORDER — IOHEXOL 350 MG/ML SOLN
95.0000 mL | Freq: Once | INTRAVENOUS | Status: AC | PRN
  Administered 2023-06-04: 95 mL via INTRAVENOUS

## 2023-06-04 MED ORDER — NITROGLYCERIN 0.4 MG SL SUBL
0.8000 mg | SUBLINGUAL_TABLET | Freq: Once | SUBLINGUAL | Status: AC
Start: 2023-06-04 — End: 2023-06-04
  Administered 2023-06-04: 0.8 mg via SUBLINGUAL

## 2023-06-05 ENCOUNTER — Ambulatory Visit (HOSPITAL_BASED_OUTPATIENT_CLINIC_OR_DEPARTMENT_OTHER)
Admission: RE | Admit: 2023-06-05 | Discharge: 2023-06-05 | Disposition: A | Discharge status: Home / Self Care | Payer: BC Managed Care – PPO | Source: Ambulatory Visit | Attending: Cardiology | Admitting: Cardiology

## 2023-06-05 ENCOUNTER — Other Ambulatory Visit: Payer: Self-pay | Admitting: Cardiology

## 2023-06-05 ENCOUNTER — Encounter: Payer: Self-pay | Admitting: Cardiology

## 2023-06-05 DIAGNOSIS — R931 Abnormal findings on diagnostic imaging of heart and coronary circulation: Secondary | ICD-10-CM

## 2023-06-06 ENCOUNTER — Ambulatory Visit: Payer: BC Managed Care – PPO | Admitting: Emergency Medicine

## 2023-06-07 ENCOUNTER — Telehealth: Payer: Self-pay

## 2023-06-07 ENCOUNTER — Telehealth: Payer: Self-pay | Admitting: Cardiology

## 2023-06-07 NOTE — Telephone Encounter (Signed)
Patient calling in about her results. Please advise

## 2023-06-07 NOTE — Telephone Encounter (Signed)
-----   Message from Marisa Hamilton sent at 06/05/2023 10:03 PM EST ----- Coronary CTA showed no coronary Ca.  There was non calcified plaque 50% prox LAD and <25% prox RCA.  Start ASA 81mg  daily.  Have her come in for FLP and ALT. Please find out if she has had any further CP

## 2023-06-07 NOTE — Telephone Encounter (Signed)
Call to patient to discuss coronary CTA results. Per Dr. Mayford Knife, Coronary CTA showed no coronary Ca. There was non calcified plaque 50% prox LAD and <25% prox RCA. Patient agrees to start ASA 81mg  daily and come in for FLP and ALT. Discussed normal blood flow in area of moderate LAD stenosis so not flow limiting. Patient denies she has had any further CP, states she originally came in to see Dr. Mayford Knife for "chest pounding". She states the the metoprolol Dr. Mayford Knife prescribed has decreased the frequency and intensity of these episodes. Patient responses forwarded to Dr. Mayford Knife.

## 2023-06-08 NOTE — Telephone Encounter (Signed)
See other telephone note from same day.

## 2023-06-11 ENCOUNTER — Ambulatory Visit: Payer: BC Managed Care – PPO | Admitting: Adult Health

## 2023-06-11 ENCOUNTER — Other Ambulatory Visit: Payer: Self-pay | Admitting: Gastroenterology

## 2023-06-11 ENCOUNTER — Other Ambulatory Visit: Payer: Self-pay | Admitting: Psychiatry

## 2023-06-11 ENCOUNTER — Telehealth: Payer: Self-pay

## 2023-06-11 DIAGNOSIS — F331 Major depressive disorder, recurrent, moderate: Secondary | ICD-10-CM

## 2023-06-11 DIAGNOSIS — F900 Attention-deficit hyperactivity disorder, predominantly inattentive type: Secondary | ICD-10-CM

## 2023-06-11 NOTE — Telephone Encounter (Signed)
Prior Authorization faxed for Auvelity 45-105 mg with Rx [employer]erred Benefits with office notes

## 2023-06-12 ENCOUNTER — Other Ambulatory Visit: Payer: Self-pay

## 2023-06-12 DIAGNOSIS — Z79899 Other long term (current) drug therapy: Secondary | ICD-10-CM

## 2023-06-12 DIAGNOSIS — E785 Hyperlipidemia, unspecified: Secondary | ICD-10-CM

## 2023-06-12 DIAGNOSIS — I2510 Atherosclerotic heart disease of native coronary artery without angina pectoris: Secondary | ICD-10-CM

## 2023-06-12 LAB — LIPID PANEL
Chol/HDL Ratio: 3.6 {ratio} (ref 0.0–4.4)
Cholesterol, Total: 182 mg/dL (ref 100–199)
HDL: 51 mg/dL (ref >39)
LDL Chol Calc (NIH): 97 mg/dL (ref 0–99)
Triglycerides: 202 mg/dL — ABNORMAL HIGH (ref 0–149)
VLDL Cholesterol Cal: 34 mg/dL (ref 5–40)

## 2023-06-12 LAB — ALT: ALT: 14 [IU]/L (ref 0–32)

## 2023-06-15 NOTE — Telephone Encounter (Signed)
Clarification pt's Auvelity 45-105 mg is approved with Rx [employer]erred Benefits effective through 06/14/2024  Non formulary F [phone] P [id number]

## 2023-06-15 NOTE — Telephone Encounter (Signed)
Prior Approval received effective 06/11/23-06/10/24 for Sunosi 150 mg tablets with [employer], WUJ#[id number]

## 2023-06-18 ENCOUNTER — Other Ambulatory Visit: Payer: Self-pay

## 2023-06-18 ENCOUNTER — Telehealth: Payer: Self-pay

## 2023-06-18 DIAGNOSIS — E785 Hyperlipidemia, unspecified: Secondary | ICD-10-CM

## 2023-06-18 DIAGNOSIS — Z79899 Other long term (current) drug therapy: Secondary | ICD-10-CM

## 2023-06-18 MED ORDER — ROSUVASTATIN CALCIUM 10 MG PO TABS: 10.0000 mg | ORAL_TABLET | Freq: Every day | ORAL | 3 refills | Status: DC

## 2023-06-18 NOTE — Telephone Encounter (Signed)
Patient agrees to start 10 mg crestor and repeat labs in 6-8 weeks. Orders placed, labs released. Mychart message sent.

## 2023-06-19 ENCOUNTER — Ambulatory Visit: Payer: BC Managed Care – PPO | Attending: Internal Medicine | Admitting: Internal Medicine

## 2023-06-19 ENCOUNTER — Encounter: Payer: Self-pay | Admitting: Internal Medicine

## 2023-06-19 VITALS — BP 110/66 | HR 63 | Ht 65.0 in | Wt 199.2 lb

## 2023-06-19 DIAGNOSIS — I493 Ventricular premature depolarization: Secondary | ICD-10-CM

## 2023-06-19 NOTE — Progress Notes (Signed)
 HPI Marisa Hamilton is referred by Dr. Shlomo for evaluation of palpitations and PVC's. She is a pleasant 63 o woman with a h/o chest pain and palpitations who was found to have a 17% burden of PVC's as well as non-sustained up to 40 seconds of SVT. Her PVC morphology is positive in AVF, and in the entire precordium and neg in R and L. She is frustrated about her inability to lose weight. She also has been found to have a soft coronary plaque in her LAD. 50%. She denies anginal symptoms. She was placed on a beta blocker and ASA. A statin was recommended but she has not had it filled. She does have palpitations.  Allergies  Allergen Reactions   Selenium Dioxide [Selenium] Anaphylaxis   Gluten Meal Other (See Comments)    Stomach upset Stomach upset Stomach upset   Milk (Cow) Other (See Comments)    Stomach upset   Nsaids Other (See Comments)    Other reaction(s): Abdominal Pain Ulcerative colitis Other reaction(s): Abdominal Pain Ulcerative colitis Ulcerative colitis Other reaction(s): Abdominal Pain Ulcerative colitis   Phentermine Other (See Comments)    Pt stated, Made my IC flare up Pt stated, Made my IC flare up Pt stated, Made my IC flare up   Amitriptyline Hcl Other (See Comments)   Bacid Other (See Comments)   Meloxicam Nausea And Vomiting   Milk-Related Compounds Other (See Comments)    Stomach upset     Current Outpatient Medications  Medication Sig Dispense Refill   Armodafinil  150 MG tablet Take 1 tablet (150 mg total) by mouth daily. 30 tablet 1   aspirin EC 81 MG tablet Take 81 mg by mouth daily. Swallow whole.     chlorproMAZINE (THORAZINE) 25 MG tablet PRN     clonazePAM  (KLONOPIN ) 1 MG tablet Take 1 tablet (1 mg total) by mouth at bedtime. 90 tablet 0   cyanocobalamin  (VITAMIN B12) 1000 MCG tablet Take 1,000 mcg by mouth daily.     Dextromethorphan-buPROPion  ER (AUVELITY ) 45-105 MG TBCR Take 1 tablet by mouth 2 (two) times daily. 60 tablet 1    famotidine  (PEPCID ) 20 MG tablet Take 20 mg by mouth daily as needed for heartburn or indigestion. Pt taking in AM and PM     ketorolac  (TORADOL ) 60 MG/2ML SOLN injection Inject 60 mg into the muscle as needed (migraines).      Lasmiditan Succinate (REYVOW) 100 MG TABS Take 100 mg by mouth as needed.     metoprolol  succinate (TOPROL  XL) 25 MG 24 hr tablet Take 1 tablet (25 mg total) by mouth daily. 90 tablet 3   omeprazole  (PRILOSEC) 40 MG capsule TAKE 1 CAPSULE BY MOUTH EVERY DAY 90 capsule 3   ondansetron  (ZOFRAN ) 4 MG tablet TAKE 1 TABLET BY MOUTH EVERY 8 HOURS AS NEEDED FOR NAUSEA AND VOMITING 20 tablet 0   rizatriptan (MAXALT) 10 MG tablet Take by mouth.     rosuvastatin  (CRESTOR ) 10 MG tablet Take 1 tablet (10 mg total) by mouth daily. 90 tablet 3   sertraline  (ZOLOFT ) 25 MG tablet TAKE 1 AND 1/2 TABLETS BY MOUTH EVERY DAY 135 tablet 0   simethicone  (MYLICON) 125 MG chewable tablet Chew 125 mg by mouth every 6 (six) hours as needed for flatulence.     tiZANidine  (ZANAFLEX ) 4 MG tablet Take 1 tablet (4 mg total) by mouth at bedtime. 30 tablet 1   topiramate  (TOPAMAX ) 50 MG tablet Take 1 tablet (50 mg total) by  mouth 2 (two) times daily. (Patient taking differently: Take 75 mg by mouth every evening.) 60 tablet 0   traZODone  (DESYREL ) 100 MG tablet TAKE 1 TABLET BY MOUTH AT BEDTIME 30 tablet 0   VITAMIN D  PO Take 4,000 Units by mouth daily.     Wheat Dextrin (BENEFIBER PO) Take by mouth.     No current facility-administered medications for this visit.     Past Medical History:  Diagnosis Date   Anxiety    Back pain    CAD (coronary artery disease), native coronary artery    50% prox LAD and <25% prox RCA>>noncalcified plaque by CTA 05/2023   CFS (chronic fatigue syndrome)    Concussion 03/17/2017   Constipation    Cystitis, interstitial    HAD BLADDER SX FOR SAME   Depression    Fainting spell    Food allergy     Foot pain    GERD (gastroesophageal reflux disease)     Headache(784.0)    IBS (irritable bowel syndrome)    TAKES PROBIOTICS   Insomnia    Lactose intolerance    Leg pain    Migraine    MVP (mitral valve prolapse)    Mild posterior mitral valve prolapse with mild MR by echo 05/2023   Nausea    Neck pain    PAT (paroxysmal atrial tachycardia) (HCC)    Noted on heart monitor 04/2023   PONV (postoperative nausea and vomiting)    PVC's (premature ventricular contractions)    PVC load 17.6% by heart monitor 04/2023   Recurrent upper respiratory infection (URI)    Restless leg syndrome    Stomach ache    Ulcerative colitis (HCC)    Varicose veins     ROS:   All systems reviewed and negative except as noted in the HPI.   Past Surgical History:  Procedure Laterality Date   ABDOMINOPLASTY  08/2012   BLADDER SURGERY     FOR IC   BREAST BIOPSY Left 02/26/2012   negative   BREAST REDUCTION SURGERY  10/10/2011   Procedure: MAMMARY REDUCTION  (BREAST);  Surgeon: Ronal DELENA Mage, MD;  Location: [facility];  Service: Plastics;  Laterality: Bilateral;   COLONOSCOPY  10/22/2015   Dr.Mann   ESOPHAGOGASTRODUODENOSCOPY ENDOSCOPY     LASIK     NASAL SEPTUM SURGERY     REDUCTION MAMMAPLASTY Bilateral    VASCULAR SURGERY     VARICOSE VEINS   WRIST ARTHROSCOPY WITH CARPOMETACARPEL [facility]) ARTHROPLASTY Left 10/20/2022     Family History  Problem Relation Age of Onset   Osteoporosis Mother    Atrial fibrillation Mother    Heart disease Mother    Stroke Mother    Depression Mother    Anxiety disorder Mother    Obesity Mother    Angioedema Father    Diabetes Father    Hypertension Father    Depression Father    Obesity Father    Migraines Brother        CHRONIC   Breast cancer Maternal Grandmother    Cancer Maternal Grandmother        ovarian   Diabetes Paternal Grandmother    Colon cancer Neg Hx    Esophageal cancer Neg Hx    Pancreatic cancer Neg Hx    Stomach cancer Neg Hx    Liver disease Neg Hx    Rectal  cancer Neg Hx    Colon polyps Neg Hx      Social History  Socioeconomic History   Marital status: Married    Spouse name: Chip Timmers   Number of children: Not on file   Years of education: Not on file   Highest education level: Not on file  Occupational History   Occupation: Retired [employer] Features Editor  Tobacco Use   Smoking status: Never   Smokeless tobacco: Never  Vaping Use   Vaping status: Never Used  Substance and Sexual Activity   Alcohol use: Not Currently   Drug use: No   Sexual activity: Yes    Partners: Male    Birth control/protection: Other-see comments, Post-menopausal    Comment: husband vasectomy  Other Topics Concern   Not on file  Social History Narrative   Not on file   Social Drivers of Health   Financial Resource Strain: Not on file  Food Insecurity: No Food Insecurity (05/17/2021)   Received from [facility], [facility]   Hunger Vital Sign    Worried About Running Out of Food in the Last Year: Never true    Ran Out of Food in the Last Year: Never true  Transportation Needs: Not on file  Physical Activity: Not on file  Stress: Not on file  Social Connections: Unknown (09/25/2021)   Received from [facility], [facility]   Social Network    Social Network: Not on file  Intimate Partner Violence: Unknown (08/18/2021)   Received from [employer], [facility]   HITS    Physically Hurt: Not on file    Insult or Talk Down To: Not on file    Threaten Physical Harm: Not on file    Scream or Curse: Not on file     BP 110/66   Pulse 63   Ht 5' 5 (1.651 m)   Wt 199 lb 3.2 oz (90.4 kg)   LMP 07/29/2012 Comment: spotting that week  SpO2 96%   BMI 33.15 kg/m   Physical Exam:  Well appearing 63 yo woman, NAD HEENT: Unremarkable Neck:  No JVD, no thyromegally Lymphatics:  No adenopathy Back:  No CVA tenderness Lungs:  Clear with no wheezes HEART:  Regular rate rhythm, no murmurs, no rubs, no clicks Abd:  soft, positive bowel  sounds, no organomegally, no rebound, no guarding Ext:  2 plus pulses, no edema, no cyanosis, no clubbing Skin:  No rashes no nodules Neuro:  CN II through XII intact, motor grossly intact   Assess/Plan: PVC's - I have recommended she continue her beta blocker with plans to uptitrate as we are able. I do not recommend and AA drug at this time though flecainide would be a consideration, especially she can get on a statin.  CAD - she has no calcium  but does have soft plaque. I have recommended she start a statin ASAP. She is interested in working on her diet. I have referred her to one of our local nutritional consultants for help with this.  Obesity - she is encouraged to lose weight.   Danelle Luster Hechler,MD

## 2023-06-19 NOTE — Telephone Encounter (Signed)
Lf1/7 lv 1/6 nv 3/31

## 2023-06-19 NOTE — Patient Instructions (Addendum)
Medication Instructions:  Your physician recommends that you continue on your current medications as directed. Please refer to the Current Medication list given to you today.  *If you need a refill on your cardiac medications before your next appointment, please call your pharmacy*  Lab Work: None ordered.  If you have labs (blood work) drawn today and your tests are completely normal, you will receive your results only by: MyChart Message (if you have MyChart) OR A paper copy in the mail If you have any lab test that is abnormal or we need to change your treatment, we will call you to review the results.  Testing/Procedures: None ordered.  Follow-Up: At [facility], you and your health needs are our priority.  As part of our continuing mission to provide you with exceptional heart care, we have created designated Provider Care Teams.  These Care Teams include your primary Cardiologist (physician) and Advanced Practice Providers (APPs -  Physician Assistants and Nurse Practitioners) who all work together to provide you with the care you need, when you need it.  Your next appointment:   3/4 months  The format for your next appointment:   In Person  Provider:   Lewayne Bunting, MD{or one of the following Advanced Practice Providers on your designated Care Team:   Francis Dowse, [place] Casimiro Needle "Mardelle Matte" [place], [place] Earnest Rosier, NP  Important Information About Sugar

## 2023-06-20 ENCOUNTER — Other Ambulatory Visit: Payer: Self-pay | Admitting: Psychiatry

## 2023-06-20 DIAGNOSIS — F515 Nightmare disorder: Secondary | ICD-10-CM

## 2023-06-20 DIAGNOSIS — F331 Major depressive disorder, recurrent, moderate: Secondary | ICD-10-CM

## 2023-06-20 DIAGNOSIS — F411 Generalized anxiety disorder: Secondary | ICD-10-CM

## 2023-06-27 ENCOUNTER — Encounter: Payer: Self-pay | Admitting: Emergency Medicine

## 2023-06-27 ENCOUNTER — Ambulatory Visit: Payer: BC Managed Care – PPO | Admitting: Emergency Medicine

## 2023-06-27 VITALS — BP 106/62 | HR 61 | Ht 65.0 in | Wt 193.6 lb

## 2023-06-27 DIAGNOSIS — J209 Acute bronchitis, unspecified: Secondary | ICD-10-CM

## 2023-06-27 DIAGNOSIS — R918 Other nonspecific abnormal finding of lung field: Secondary | ICD-10-CM

## 2023-06-27 DIAGNOSIS — R0602 Shortness of breath: Secondary | ICD-10-CM

## 2023-06-27 MED ORDER — AZITHROMYCIN 250 MG PO TABS: ORAL_TABLET | ORAL | 0 refills | Status: DC

## 2023-06-27 NOTE — Assessment & Plan Note (Signed)
She has a viral syndrome, question whether there is a superimposed bacterial bronchitis as she has seen some purulent mucus.  Reasonable to treat her with azithromycin.  She is not wheezing I do not think she needs steroids at this time.  Also we will try symptomatic over-the-counter relief until viral syndrome resolves

## 2023-06-27 NOTE — Assessment & Plan Note (Signed)
Most of the nodules which were originally detected post COVID have resolved.  There is a residual 7 mm right upper lobe nodule that was stable in size over a 74-month interval but the last scan was in July 2023.  Will plan to repeat to ensure 2 years of overall stability.

## 2023-06-27 NOTE — Assessment & Plan Note (Signed)
She is having symptoms that could be consistent with asthmatic bronchitis.  Question whether she may have developed an asthma-like syndrome in the aftermath of her COVID-19 in 03/2021.  She never had PFT and I would like to get them when she is over this acute illness

## 2023-06-27 NOTE — Patient Instructions (Addendum)
We reviewed your CT scan of the chest from 11/2021. We will plan to repeat your CT scan of the chest in March 2025 to ensure stability We will arrange for pulmonary function testing at your next office visit Please take azithromycin as directed until completely gone.  A prescription was sent to your pharmacy Try using Tylenol Cold and Flu short-term for symptom relief until you are over this acute illness.  Would avoid using this medication long-term as it can impact blood pressure and heart rate management If you worsen in any way please call so we can discuss symptoms Follow with cardiology as planned Follow with Dr. Delton Coombes next available opening with pulmonary function testing on the same day.

## 2023-06-27 NOTE — Progress Notes (Signed)
Subjective:    Patient ID: Marisa Hamilton, female    DOB: 1960/05/21, 63 y.o.   MRN: [id number]  HPI  ROV 06/27/2023 --follow-up visit for 63 year old woman, never smoker with fibromyalgia/chronic fatigue, interstitial cystitis, IBS, restless legs, esophageal narrowing/Schatzki's ring, thyroid nodules.  She had COVID-19 in 03/2021.  I saw her in 06/2021 for pulmonary nodular disease that included a 7 mm right upper lobe nodule with some pleural stranding, 6.5 mm pleural-based nodule in the right upper lobe, 6 mm right lower lobe nodule, 5 mm right lower lobe nodule, multiple 2 to 3 mm left upper lobe subpleural nodules.  There was also some mild subpleural interstitial reticulation without any overt honeycomb change.  We decided to follow these with serial imaging.  We also discussed performing pulmonary function testing to evaluate shortness of breath but it does not look like this was done.  She had a coronary calcium CT chest 06/04/2023 with some noncalcified plaque, following with cardiology She started a viral syndrome beginning over a week ago, started as GI sx, then URI sx. Has been associated with exertional SOB. Now she has having cough and yellow mucous.   Super D CT chest done 11/23/2021 showed that her 7 mm right upper lobe pulmonary nodule was stable in size and appearance.  There had been interval resolution of some of the nodular changes consistent with inflammatory process.   Review of Systems As per HPI  Past Medical History:  Diagnosis Date   Anxiety    Back pain    CAD (coronary artery disease), native coronary artery    50% prox LAD and <25% prox RCA>>noncalcified plaque by CTA 05/2023   CFS (chronic fatigue syndrome)    Concussion 03/17/2017   Constipation    Cystitis, interstitial    HAD BLADDER SX FOR SAME   Depression    Fainting spell    Food allergy    Foot pain    GERD (gastroesophageal reflux disease)    Headache(784.0)    IBS (irritable bowel syndrome)     TAKES PROBIOTICS   Insomnia    Lactose intolerance    Leg pain    Migraine    MVP (mitral valve prolapse)    Mild posterior mitral valve prolapse with mild MR by echo 05/2023   Nausea    Neck pain    PAT (paroxysmal atrial tachycardia) (HCC)    Noted on heart monitor 04/2023   PONV (postoperative nausea and vomiting)    PVC's (premature ventricular contractions)    PVC load 17.6% by heart monitor 04/2023   Recurrent upper respiratory infection (URI)    Restless leg syndrome    Stomach ache    Ulcerative colitis (HCC)    Varicose veins      Family History  Problem Relation Age of Onset   Osteoporosis Mother    Atrial fibrillation Mother    Heart disease Mother    Stroke Mother    Depression Mother    Anxiety disorder Mother    Obesity Mother    Angioedema Father    Diabetes Father    Hypertension Father    Depression Father    Obesity Father    Migraines Brother        CHRONIC   Breast cancer Maternal Grandmother    Cancer Maternal Grandmother        ovarian   Diabetes Paternal Grandmother    Colon cancer Neg Hx    Esophageal cancer Neg Hx  Pancreatic cancer Neg Hx    Stomach cancer Neg Hx    Liver disease Neg Hx    Rectal cancer Neg Hx    Colon polyps Neg Hx      Social History   Socioeconomic History   Marital status: Married    Spouse name: Retail buyer   Number of children: Not on file   Years of education: Not on file   Highest education level: Not on file  Occupational History   Occupation: Retired Magazine features editor  Tobacco Use   Smoking status: Never   Smokeless tobacco: Never  Vaping Use   Vaping status: Never Used  Substance and Sexual Activity   Alcohol use: Not Currently   Drug use: No   Sexual activity: Yes    Partners: Male    Birth control/protection: Other-see comments, Post-menopausal    Comment: husband vasectomy  Other Topics Concern   Not on file  Social History Narrative   Not on file   Social Drivers of Health    Financial Resource Strain: Not on file  Food Insecurity: No Food Insecurity (05/17/2021)   Received from [facility], [facility]   Hunger Vital Sign    Worried About Running Out of Food in the Last Year: Never true    Ran Out of Food in the Last Year: Never true  Transportation Needs: Not on file  Physical Activity: Not on file  Stress: Not on file  Social Connections: Unknown (09/25/2021)   Received from [facility], [facility]   Social Network    Social Network: Not on file  Intimate Partner Violence: Unknown (08/18/2021)   Received from [employer], [facility]   HITS    Physically Hurt: Not on file    Insult or Talk Down To: Not on file    Threaten Physical Harm: Not on file    Scream or Curse: Not on file     Allergies  Allergen Reactions   Selenium Dioxide [Selenium] Anaphylaxis   Gluten Meal Other (See Comments)    Stomach upset Stomach upset Stomach upset   Milk (Cow) Other (See Comments)    Stomach upset   Nsaids Other (See Comments)    Other reaction(s): Abdominal Pain Ulcerative colitis Other reaction(s): Abdominal Pain Ulcerative colitis Ulcerative colitis Other reaction(s): Abdominal Pain Ulcerative colitis   Phentermine Other (See Comments)    Pt stated, "Made my IC flare up" Pt stated, "Made my IC flare up" Pt stated, "Made my IC flare up"   Amitriptyline Hcl Other (See Comments)   Bacid Other (See Comments)   Meloxicam Nausea And Vomiting   Milk-Related Compounds Other (See Comments)    Stomach upset     Outpatient Medications Prior to Visit  Medication Sig Dispense Refill   Armodafinil 150 MG tablet TAKE 1 TABLET BY MOUTH ONCE DAILY 30 tablet 1   aspirin EC 81 MG tablet Take 81 mg by mouth daily. Swallow whole.     chlorproMAZINE (THORAZINE) 25 MG tablet PRN     clonazePAM (KLONOPIN) 1 MG tablet Take 1 tablet (1 mg total) by mouth at bedtime. 90 tablet 0   cyanocobalamin (VITAMIN B12) 1000 MCG tablet Take 1,000 mcg by mouth daily.      Dextromethorphan-buPROPion ER (AUVELITY) 45-105 MG TBCR Take 1 tablet by mouth 2 (two) times daily. 60 tablet 1   famotidine (PEPCID) 20 MG tablet Take 20 mg by mouth daily as needed for heartburn or indigestion. Pt taking in AM and PM  ketorolac (TORADOL) 60 MG/2ML SOLN injection Inject 60 mg into the muscle as needed (migraines).      Lasmiditan Succinate (REYVOW) 100 MG TABS Take 100 mg by mouth as needed.     metoprolol succinate (TOPROL XL) 25 MG 24 hr tablet Take 1 tablet (25 mg total) by mouth daily. 90 tablet 3   omeprazole (PRILOSEC) 40 MG capsule TAKE 1 CAPSULE BY MOUTH EVERY DAY 90 capsule 3   ondansetron (ZOFRAN) 4 MG tablet TAKE 1 TABLET BY MOUTH EVERY 8 HOURS AS NEEDED FOR NAUSEA AND VOMITING 20 tablet 0   rizatriptan (MAXALT) 10 MG tablet Take by mouth.     rosuvastatin (CRESTOR) 10 MG tablet Take 1 tablet (10 mg total) by mouth daily. 90 tablet 3   sertraline (ZOLOFT) 25 MG tablet TAKE 1 AND 1/2 TABLETS BY MOUTH ONCE DAILY 45 tablet 1   simethicone (MYLICON) 125 MG chewable tablet Chew 125 mg by mouth every 6 (six) hours as needed for flatulence.     tiZANidine (ZANAFLEX) 4 MG tablet Take 1 tablet (4 mg total) by mouth at bedtime. 30 tablet 1   topiramate (TOPAMAX) 50 MG tablet Take 1 tablet (50 mg total) by mouth 2 (two) times daily. (Patient taking differently: Take 75 mg by mouth every evening.) 60 tablet 0   traZODone (DESYREL) 100 MG tablet TAKE 1 TABLET BY MOUTH AT BEDTIME 30 tablet 0   VITAMIN D PO Take 4,000 Units by mouth daily.     Wheat Dextrin (BENEFIBER PO) Take by mouth.     No facility-administered medications prior to visit.         Objective:   Physical Exam Vitals:   06/27/23 1522  BP: 106/62  Pulse: 61  SpO2: 92%  Weight: 193 lb 9.6 oz (87.8 kg)  Height: 5\' 5"  (1.651 m)     Gen: Pleasant, well-nourished, in no distress,  normal affect  ENT: No lesions,  mouth clear,  oropharynx clear, no postnasal drip  Neck: No JVD, no  stridor  Lungs: No use of accessory muscles, no crackles or wheezing within normal breath.  She does have some end expiratory wheeze and develops cough with a forced expiration  Cardiovascular: RRR, heart sounds normal, no murmur or gallops, no peripheral edema  Musculoskeletal: No deformities, no cyanosis or clubbing  Neuro: alert, awake, non focal  Skin: Warm, no lesions or rash      Assessment & Plan:  Acute bronchitis She has a viral syndrome, question whether there is a superimposed bacterial bronchitis as she has seen some purulent mucus.  Reasonable to treat her with azithromycin.  She is not wheezing I do not think she needs steroids at this time.  Also we will try symptomatic over-the-counter relief until viral syndrome resolves  Pulmonary nodules Most of the nodules which were originally detected post COVID have resolved.  There is a residual 7 mm right upper lobe nodule that was stable in size over a 62-month interval but the last scan was in July 2023.  Will plan to repeat to ensure 2 years of overall stability.  Dyspnea She is having symptoms that could be consistent with asthmatic bronchitis.  Question whether she may have developed an asthma-like syndrome in the aftermath of her COVID-19 in 03/2021.  She never had PFT and I would like to get them when she is over this acute illness  Time spent 43 minutes  Levy Pupa, MD, PhD 06/27/2023, 3:56 PM [facility] [phone] or  if no answer before 7:00PM call [phone] For any issues after 7:00PM please call eLink [phone]

## 2023-07-05 ENCOUNTER — Other Ambulatory Visit: Payer: Self-pay | Admitting: Psychiatry

## 2023-07-05 ENCOUNTER — Telehealth: Payer: Self-pay | Admitting: Psychiatry

## 2023-07-05 DIAGNOSIS — F331 Major depressive disorder, recurrent, moderate: Secondary | ICD-10-CM

## 2023-07-05 MED ORDER — AUVELITY 45-105 MG PO TBCR: 1.0000 | EXTENDED_RELEASE_TABLET | Freq: Two times a day (BID) | ORAL | 2 refills | Status: DC

## 2023-07-05 NOTE — Telephone Encounter (Signed)
Marisa Hamilton called at 2:05 to report that the Marisa Hamilton is working well for her at 2/day.  She is getting this medication through [facility] and they will be sending a refill request.  She wanted to be sure you knew that the medication was working and to please refill the medication

## 2023-07-05 NOTE — Telephone Encounter (Signed)
Noted.  Thanks.  Sent RX

## 2023-07-11 ENCOUNTER — Encounter: Payer: Self-pay | Admitting: Gastroenterology

## 2023-07-11 ENCOUNTER — Ambulatory Visit: Payer: BC Managed Care – PPO | Admitting: Gastroenterology

## 2023-07-11 VITALS — BP 124/70 | HR 75 | Ht 65.0 in | Wt 197.0 lb

## 2023-07-11 DIAGNOSIS — K2100 Gastro-esophageal reflux disease with esophagitis, without bleeding: Secondary | ICD-10-CM | POA: Diagnosis not present

## 2023-07-11 DIAGNOSIS — G8929 Other chronic pain: Secondary | ICD-10-CM

## 2023-07-11 DIAGNOSIS — Z860100 Personal history of colon polyps, unspecified: Secondary | ICD-10-CM | POA: Diagnosis not present

## 2023-07-11 DIAGNOSIS — R1013 Epigastric pain: Secondary | ICD-10-CM | POA: Diagnosis not present

## 2023-07-11 DIAGNOSIS — R12 Heartburn: Secondary | ICD-10-CM

## 2023-07-11 MED ORDER — PANTOPRAZOLE SODIUM 40 MG PO TBEC: 40.0000 mg | DELAYED_RELEASE_TABLET | Freq: Two times a day (BID) | ORAL | 3 refills | Status: DC

## 2023-07-11 MED ORDER — SUFLAVE 178.7 G PO SOLR: 1.0000 | Freq: Once | ORAL | 0 refills | Status: DC

## 2023-07-11 MED ORDER — SUTAB 1479-225-188 MG PO TABS: ORAL_TABLET | ORAL | 0 refills | Status: DC

## 2023-07-11 NOTE — Patient Instructions (Addendum)
 VISIT SUMMARY:  Today, we discussed your ongoing issues with gastroesophageal reflux disease (GERD) and its exacerbation due to recent bronchitis. We reviewed your current symptoms, including chest burning, sour stomach, and epigastric pain, and made adjustments to your treatment plan. We also addressed your intermittent constipation and upcoming colonoscopy.  YOUR PLAN:  -GASTROESOPHAGEAL REFLUX DISEASE (GERD): GERD is a condition where stomach acid frequently flows back into the tube connecting your mouth and stomach, causing irritation. We are switching your medication to pantoprazole 40 mg twice daily before breakfast and dinner for 3 months. Continue taking Pepcid at bedtime as needed and use sucralfate as a slurry before each meal. Eat small, frequent meals, avoid late dinners, caffeinated beverages after 1 PM, sugary drinks, and soda. Drink water as your primary beverage.  -ESOPHAGITIS: Esophagitis is inflammation of the esophagus caused by acid reflux. Continue using sucralfate as a slurry before each meal to help heal the esophageal lining.  -HIATAL HERNIA: A hiatal hernia occurs when part of the stomach pushes up through the diaphragm muscle. This can worsen acid reflux symptoms. Manage this with the same treatment plan as GERD to reduce symptoms and promote healing.  -BRONCHITIS: Bronchitis is inflammation of the bronchial tubes, often causing coughing. This coughing can worsen GERD symptoms. Follow up with your primary care physician for further management of bronchitis and cough.  -CONSTIPATION: Constipation is infrequent bowel movements or difficulty passing stools. Drink at least 8 cups of water daily and use MiraLAX as needed for constipation.  You will receive your bowel preparation through [facility], which ensures the lowest copay and home delivery, with outreach via text or call from an [phone] number. Please respond promptly to avoid rescheduling. If you are interested in alternative  options or have any questions please contact them at [phone]  Your Provider Has Sent Your Bowel Prep Regimen To [facility] What to expect. [facility] will contact you to verify your information and collect your copay, if applicable. Enjoy the comfort of your home while we deliver your prescription to you, free of any shipping charges. Fast, FREE delivery or shipping. [facility] accepts all major insurance benefits and applies discounts & coupons  Have additional questions? [facility]'s patient care team is always here to help.  Chat: [email or web address] Call: [phone] Email: [email or web address] NCPDP: [id number] How will we contact you? Welcome Phone call  a Welcome text and a Checkout link in a text Texts you receive from [phone] Are Not Spam.   *To set up delivery, you must complete the checkout process via link or speak to one of our patient care representatives. If we are unable to reach you, your prescription may be delayed.  To avoid waiting on hold if you call. Utilize the secure chat feature and request [facility] call you to complete the transaction or expedite your concerns.   I appreciate the  opportunity to care for you  Thank You   Marsa Aris , MD   -GENERAL HEALTH MAINTENANCE: You are due for a colonoscopy next month. This is important for monitoring and removing any polyps. Your previous colonoscopy revealed three polyps, one of significant size. Use Sutabs for bowel preparation and follow up after the colonoscopy.  INSTRUCTIONS:  Please schedule a follow-up appointment in 3 months to reassess your GERD management. Additionally, schedule a follow-up appointment after your colonoscopy on May 1st at 9:30 AM.

## 2023-07-11 NOTE — Progress Notes (Unsigned)
 Marisa Hamilton    [id number]    04-24-1961  Primary Care Physician:Hayes, Natale Lay, FNP  Referring Physician: Camie Patience, FNP [address] [place],  [place] [address]   Chief complaint:  GERD  Discussed the use of AI scribe software for clinical note transcription with the patient, who gave verbal consent to proceed.  History of Present Illness    Marisa Hamilton is a 63 year old female with gastroesophageal reflux disease (GERD) who presents with exacerbation of symptoms due to bronchitis.  She is experiencing a burning sensation in her chest, particularly when coughing, due to increased pressure causing stomach contents to reflux. Despite long-term use of omeprazole, she continues to experience persistent symptoms. She takes Pepcid at night and uses sucralfate from a previous prescription by dissolving it in water to drink as a slurry. Touching her stomach causes pain, and she describes a 'sour' feeling, especially when bending over.  A previous endoscopy in October 2024 showed reflux-related changes and irritation in the esophagus, with a small hiatal hernia contributing to her symptoms.  Her bowel habits have improved with daily Benefiber, although she experiences nervous bowel movements when preparing to leave the house. She has at least one to two bowel movements a day, which she considers normal. Occasionally, she experiences constipation, which she manages with MiraLAX as needed.  She is due for a colonoscopy next month, having had three polyps removed previously, one of which was sizable. She also mentions a hemorrhoid that she would like to have removed, although it cannot be done during the colonoscopy.      EGD 02/19/2023  - LA Grade B reflux esophagitis with no bleeding. - Gastroesophageal flap valve classified as Hill Grade III ( minimal fold, loose to endoscope, hiatal hernia likely) . - Normal stomach. - Normal examined  duodenum. - Normal examined duodenum. - Biopsies were taken with a cold forceps for evaluation of eosinophilic esophagitis. 1. Surgical [P], distal esophagus :       ESOPHAGEAL SQUAMOUS MUCOSA WITH NO SIGNIFICANT DIAGNOSTIC ALTERATION.       NO EVIDENCE OF INTRAEPITHELIAL EOSINOPHILS OR LYMPHOCYTES.        2. Surgical [P], proximal esophagus :       ESOPHAGEAL SQUAMOUS MUCOSA WITH NO SIGNIFICANT DIAGNOSTIC ALTERATION.       NO EVIDENCE OF INTRAEPITHELIAL EOSINOPHILS OR LYMPHOCYTES.   Colonoscopy 07/15/2020 - Three 5 to 11 mm polyps in the ascending colon and in the cecum, removed with a cold snare. Resected and retrieved. - Diverticulosis in the sigmoid colon. - Non- bleeding internal hemorrhoids. Surgical [P], colon, ascending and cecum, polyp (3) - TUBULAR ADENOMA (MULTIPLE FRAGMENTS). - NO HIGH GRADE DYSPLASIA OR MALIGNANCY.  Outpatient Encounter Medications as of 07/11/2023  Medication Sig   Armodafinil 150 MG tablet TAKE 1 TABLET BY MOUTH ONCE DAILY (Patient taking differently: Take 150 mg by mouth daily as needed.)   aspirin EC 81 MG tablet Take 81 mg by mouth daily. Swallow whole.   chlorproMAZINE (THORAZINE) 25 MG tablet PRN   clonazePAM (KLONOPIN) 1 MG tablet Take 1 tablet (1 mg total) by mouth at bedtime.   cyanocobalamin (VITAMIN B12) 1000 MCG tablet Take 1,000 mcg by mouth daily.   Dextromethorphan-buPROPion ER (AUVELITY) 45-105 MG TBCR Take 1 tablet by mouth 2 (two) times daily.   famotidine (PEPCID) 20 MG tablet Take 20 mg by mouth daily as needed for heartburn or indigestion. Pt  taking in AM and PM   ketorolac (TORADOL) 60 MG/2ML SOLN injection Inject 60 mg into the muscle as needed (migraines).    Lasmiditan Succinate (REYVOW) 100 MG TABS Take 100 mg by mouth as needed.   metoprolol succinate (TOPROL XL) 25 MG 24 hr tablet Take 1 tablet (25 mg total) by mouth daily.   ondansetron (ZOFRAN) 4 MG tablet TAKE 1 TABLET BY MOUTH EVERY 8 HOURS AS NEEDED FOR NAUSEA AND VOMITING    pantoprazole (PROTONIX) 40 MG tablet Take 1 tablet (40 mg total) by mouth 2 (two) times daily before a meal.   rizatriptan (MAXALT) 10 MG tablet Take 10 mg by mouth as needed for migraine.   rosuvastatin (CRESTOR) 10 MG tablet Take 1 tablet (10 mg total) by mouth daily.   sertraline (ZOLOFT) 25 MG tablet TAKE 1 AND 1/2 TABLETS BY MOUTH ONCE DAILY   simethicone (MYLICON) 125 MG chewable tablet Chew 125 mg by mouth every 6 (six) hours as needed for flatulence.   Sodium Sulfate-Mag Sulfate-KCl (SUTAB) [phone] MG TABS Use as directed for colonoscopy. MANUFACTURER CODES!! BIN: [id number] PCN: CN GROUP: [id number] MEMBER ID: [id number];[employer] AS SECONDARY INSURANCE ;NO PRIOR AUTHORIZATION   tiZANidine (ZANAFLEX) 4 MG tablet Take 1 tablet (4 mg total) by mouth at bedtime.   topiramate (TOPAMAX) 50 MG tablet Take 1 tablet (50 mg total) by mouth 2 (two) times daily. (Patient taking differently: Take 75 mg by mouth every evening.)   traZODone (DESYREL) 100 MG tablet TAKE 1 TABLET BY MOUTH AT BEDTIME   VITAMIN D PO Take 4,000 Units by mouth daily.   Wheat Dextrin (BENEFIBER PO) Take by mouth.   [DISCONTINUED] omeprazole (PRILOSEC) 40 MG capsule TAKE 1 CAPSULE BY MOUTH EVERY DAY   [DISCONTINUED] SUFLAVE 178.7 g SOLR Take 1 kit by mouth once for 1 dose.   [DISCONTINUED] azithromycin (ZITHROMAX) 250 MG tablet Take 2 tablets on the first day, then take 1 tablet daily until completely gone   No facility-administered encounter medications on file as of 07/11/2023.    Allergies as of 07/11/2023 - Review Complete 07/11/2023  Allergen Reaction Noted   Selenium dioxide [selenium] Anaphylaxis 06/01/2021   Gluten meal Other (See Comments) 06/10/2019   Milk (cow) Other (See Comments) 06/10/2019   Nsaids Other (See Comments) 04/29/2013   Phentermine Other (See Comments) 03/11/2019   Amitriptyline hcl Other (See Comments) 07/11/2021   Bacid Other (See Comments) 07/11/2021   Meloxicam Nausea And Vomiting 01/04/2021    Milk-related compounds Other (See Comments) 06/10/2019    Past Medical History:  Diagnosis Date   Anxiety    Back pain    CAD (coronary artery disease), native coronary artery    50% prox LAD and <25% prox RCA>>noncalcified plaque by CTA 05/2023   CFS (chronic fatigue syndrome)    Concussion 03/17/2017   Constipation    Cystitis, interstitial    HAD BLADDER SX FOR SAME   Depression    Fainting spell    Food allergy    Foot pain    GERD (gastroesophageal reflux disease)    Headache(784.0)    IBS (irritable bowel syndrome)    TAKES PROBIOTICS   Insomnia    Lactose intolerance    Leg pain    Migraine    MVP (mitral valve prolapse)    Mild posterior mitral valve prolapse with mild MR by echo 05/2023   Nausea    Neck pain    PAT (paroxysmal atrial tachycardia) (HCC)    Noted on heart monitor  04/2023   PONV (postoperative nausea and vomiting)    PVC's (premature ventricular contractions)    PVC load 17.6% by heart monitor 04/2023   Recurrent upper respiratory infection (URI)    Restless leg syndrome    Stomach ache    Ulcerative colitis (HCC)    Varicose veins     Past Surgical History:  Procedure Laterality Date   ABDOMINOPLASTY  08/2012   BLADDER SURGERY     FOR IC   BREAST BIOPSY Left 02/26/2012   negative   BREAST REDUCTION SURGERY  10/10/2011   Procedure: MAMMARY REDUCTION  (BREAST);  Surgeon: Karie Fetch, MD;  Location: [facility];  Service: Plastics;  Laterality: Bilateral;   COLONOSCOPY  10/22/2015   Dr.Mann   ESOPHAGOGASTRODUODENOSCOPY ENDOSCOPY     LASIK     NASAL SEPTUM SURGERY     REDUCTION MAMMAPLASTY Bilateral    VASCULAR SURGERY     VARICOSE VEINS   WRIST ARTHROSCOPY WITH CARPOMETACARPEL [facility]) ARTHROPLASTY Left 10/20/2022    Family History  Problem Relation Age of Onset   Osteoporosis Mother    Atrial fibrillation Mother    Heart disease Mother    Stroke Mother    Depression Mother    Anxiety disorder Mother     Obesity Mother    Angioedema Father    Diabetes Father    Hypertension Father    Depression Father    Obesity Father    Migraines Brother        CHRONIC   Breast cancer Maternal Grandmother    Cancer Maternal Grandmother        ovarian   Diabetes Paternal Grandmother    Colon cancer Neg Hx    Esophageal cancer Neg Hx    Pancreatic cancer Neg Hx    Stomach cancer Neg Hx    Liver disease Neg Hx    Rectal cancer Neg Hx    Colon polyps Neg Hx     Social History   Socioeconomic History   Marital status: Married    Spouse name: Retail buyer   Number of children: 2   Years of education: Not on file   Highest education level: Not on file  Occupational History   Occupation: Retired Magazine features editor  Tobacco Use   Smoking status: Never   Smokeless tobacco: Never  Vaping Use   Vaping status: Never Used  Substance and Sexual Activity   Alcohol use: Not Currently   Drug use: No   Sexual activity: Yes    Partners: Male    Birth control/protection: Other-see comments, Post-menopausal    Comment: husband vasectomy  Other Topics Concern   Not on file  Social History Narrative   Not on file   Social Drivers of Health   Financial Resource Strain: Low Risk  (07/09/2023)   Received from [facility]   Overall Financial Resource Strain (CARDIA)    Difficulty of Paying Living Expenses: Not hard at all  Food Insecurity: No Food Insecurity (07/09/2023)   Received from [facility]   Hunger Vital Sign    Worried About Running Out of Food in the Last Year: Never true    Ran Out of Food in the Last Year: Never true  Transportation Needs: No Transportation Needs (07/09/2023)   Received from [facility] - Transportation    Lack of Transportation (Medical): No    Lack of Transportation (Non-Medical): No  Physical Activity: Unknown (07/09/2023)   Received from [facility]  Exercise Vital Sign    Days of Exercise per Week: 0 days    Minutes of Exercise per Session: Not  on file  Stress: No Stress Concern Present (07/09/2023)   Received from [facility] - Occupational Stress Questionnaire    Feeling of Stress : Only a little  Social Connections: Socially Integrated (07/09/2023)   Received from [facility]   Social Network    How would you rate your social network (family, work, friends)?: Good participation with social networks  Intimate Partner Violence: Not At Risk (07/09/2023)   Received from [facility]   HITS    Over the last 12 months how often did your partner physically hurt you?: Never    Over the last 12 months how often did your partner insult you or talk down to you?: Never    Over the last 12 months how often did your partner threaten you with physical harm?: Never    Over the last 12 months how often did your partner scream or curse at you?: Never      Review of systems: All other review of systems negative except as mentioned in the HPI.   Physical Exam: Vitals:   07/11/23 1424  BP: 124/70  Pulse: 75   Body mass index is 32.78 kg/m. Gen:      No acute distress HEENT:  sclera anicteric CV: s1s2 rrr, no murmur Lungs: B/l clear. Abd:      soft, non-tender; no palpable masses, no distension Ext:    No edema Neuro: alert and oriented x 3 Psych: normal mood and affect  Data Reviewed:  Reviewed labs, radiology imaging, old records and pertinent past GI work up     Assessment and Plan    Gastroesophageal Reflux Disease (GERD) GERD exacerbated by recent bronchitis, presenting with chest burning, sour stomach, and epigastric pain. Long-term omeprazole use is insufficient. Coughing increases intra-abdominal pressure, worsening reflux. Discussed switching to pantoprazole, its benefits, and short-term use of Pepcid and sucralfate for symptom relief. Emphasized dietary modifications. - Switch to pantoprazole 40 mg twice daily before breakfast and dinner for 3 months. - Continue Pepcid  at bedtime as needed. - Use sucralfate (Carafate) as a slurry before each meal as needed. - Advise small frequent meals and avoid late dinners. - Avoid caffeinated beverages after 1 PM, sugary drinks, and soda. - Recommend water as the primary beverage.  Esophagitis Esophagitis secondary to GERD, confirmed by endoscopy showing reflux-related changes and irritation. Discussed the use of sucralfate to heal the esophageal lining. - Use sucralfate (Carafate) as a slurry before each meal as needed.  Hiatal Hernia Small hiatal hernia contributing to reflux symptoms by keeping the valve open, allowing acid reflux. The hernia is not severe but exacerbates symptoms. - Manage with the same treatment plan as GERD to reduce symptoms and promote healing.  Bronchitis Recent bronchitis contributing to GERD symptoms due to persistent coughing. Discussed the need to break the cycle of coughing and acid reflux. - Follow-up with your pulmonologist or primary care physician for further management of bronchitis and cough.  Constipation Intermittent constipation. Managed with Benefiber and occasional MiraLAX. Discussed hydration. - Advise drinking at least 8 cups of water daily. - Use MiraLAX as needed for constipation.  General Health Maintenance Due for a colonoscopy next month. Previous colonoscopy revealed three polyps, one of significant size. Discussed the importance of follow-up and preparation. - Schedule colonoscopy for May 1st at 9:30 AM. - Use Sutabs  for bowel preparation. - Set up a follow-up appointment after the colonoscopy.  Follow-up - Schedule follow-up appointment in 3 months to reassess GERD management. - Follow-up appointment after the colonoscopy.       This visit required >40 minutes of patient care (this includes precharting, chart review, review of results, face-to-face time used for counseling as well as treatment plan and follow-up. The patient was provided an opportunity to  ask questions and all were answered. The patient agreed with the plan and demonstrated an understanding of the instructions.  Iona Beard , MD    CC: Camie Patience, FNP

## 2023-07-12 ENCOUNTER — Other Ambulatory Visit: Payer: Self-pay | Admitting: Psychiatry

## 2023-07-12 ENCOUNTER — Encounter: Payer: Self-pay | Admitting: Gastroenterology

## 2023-07-12 DIAGNOSIS — F515 Nightmare disorder: Secondary | ICD-10-CM

## 2023-07-12 DIAGNOSIS — F5105 Insomnia due to other mental disorder: Secondary | ICD-10-CM

## 2023-07-20 ENCOUNTER — Other Ambulatory Visit: Payer: Self-pay | Admitting: Psychiatry

## 2023-07-20 DIAGNOSIS — F5105 Insomnia due to other mental disorder: Secondary | ICD-10-CM

## 2023-07-24 ENCOUNTER — Ambulatory Visit
Admission: RE | Admit: 2023-07-24 | Discharge: 2023-07-24 | Disposition: A | Discharge status: Home / Self Care | Payer: BC Managed Care – PPO | Source: Ambulatory Visit | Attending: Emergency Medicine | Admitting: Emergency Medicine

## 2023-07-24 DIAGNOSIS — R918 Other nonspecific abnormal finding of lung field: Secondary | ICD-10-CM

## 2023-07-25 ENCOUNTER — Other Ambulatory Visit: Payer: Self-pay | Admitting: Psychiatry

## 2023-07-25 DIAGNOSIS — F900 Attention-deficit hyperactivity disorder, predominantly inattentive type: Secondary | ICD-10-CM

## 2023-07-25 DIAGNOSIS — F331 Major depressive disorder, recurrent, moderate: Secondary | ICD-10-CM

## 2023-08-03 ENCOUNTER — Ambulatory Visit: Payer: BC Managed Care – PPO | Admitting: Emergency Medicine

## 2023-08-07 ENCOUNTER — Other Ambulatory Visit: Payer: Self-pay | Admitting: Psychiatry

## 2023-08-07 DIAGNOSIS — F331 Major depressive disorder, recurrent, moderate: Secondary | ICD-10-CM

## 2023-08-07 DIAGNOSIS — F900 Attention-deficit hyperactivity disorder, predominantly inattentive type: Secondary | ICD-10-CM

## 2023-08-09 ENCOUNTER — Other Ambulatory Visit: Payer: Self-pay | Admitting: Psychiatry

## 2023-08-09 DIAGNOSIS — F515 Nightmare disorder: Secondary | ICD-10-CM

## 2023-08-09 NOTE — Telephone Encounter (Signed)
 Pt stopped DT bladder px

## 2023-08-13 ENCOUNTER — Encounter: Payer: Self-pay | Admitting: Psychiatry

## 2023-08-13 ENCOUNTER — Ambulatory Visit: Payer: BC Managed Care – PPO | Admitting: Psychiatry

## 2023-08-13 DIAGNOSIS — F515 Nightmare disorder: Secondary | ICD-10-CM

## 2023-08-13 DIAGNOSIS — F5105 Insomnia due to other mental disorder: Secondary | ICD-10-CM | POA: Diagnosis not present

## 2023-08-13 DIAGNOSIS — F331 Major depressive disorder, recurrent, moderate: Secondary | ICD-10-CM | POA: Diagnosis not present

## 2023-08-13 DIAGNOSIS — M797 Fibromyalgia: Secondary | ICD-10-CM

## 2023-08-13 DIAGNOSIS — F900 Attention-deficit hyperactivity disorder, predominantly inattentive type: Secondary | ICD-10-CM | POA: Diagnosis not present

## 2023-08-13 DIAGNOSIS — F411 Generalized anxiety disorder: Secondary | ICD-10-CM

## 2023-08-13 DIAGNOSIS — F99 Mental disorder, not otherwise specified: Secondary | ICD-10-CM

## 2023-08-13 MED ORDER — CLONAZEPAM 1 MG PO TABS: 1.0000 mg | ORAL_TABLET | Freq: Every day | ORAL | 0 refills | Status: DC

## 2023-08-13 MED ORDER — TRAZODONE HCL 100 MG PO TABS: 100.0000 mg | ORAL_TABLET | Freq: Every day | ORAL | 0 refills | Status: DC

## 2023-08-13 MED ORDER — SERTRALINE HCL 25 MG PO TABS: 37.5000 mg | ORAL_TABLET | Freq: Every day | ORAL | 0 refills | Status: DC

## 2023-08-13 NOTE — Progress Notes (Signed)
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 really helpful.    Stress and dep worse lately.  Health related px worse with stress.  More px dealing with dental px.    The clonazepam is medically necessary.  She is failed multiple other sleep medications.  She is aware of sleep hygiene issues in detail. We discussed the short-term risks associated with benzodiazepines including sedation and increased fall risk among others.  Discussed long-term side effect risk including sedative SE, dependence, potential withdrawal symptoms, and the potential eventual dose-related risk of dementia.  But recent studies from 2020 dispute this association between benzodiazepines and dementia risk. Newer studies in 2020 do not support an association with dementia.   sHe is tolerating meds well.    Recently addressing B12 and low D  Consider try NAC for mild cognitive complaints. (NAC)  N-Acetylcysteine at 600 mg 2 daily to help with mild cognitive  problems  Other option Aricept, .   Continue armodafinil 150 mg AM stopped lamotrigine back to 100 DT wt gain    Continue Auvelity for chronic dep   Auvelity helpful and tapered lamotrigine.     continue sertraline 37.5 mg bc worse with less.  But consider reduction bc therapy helped and Auvelity is helping. Continue trazodone 100 mg HS Continue clonazepam 1 mg HS and consider  switch to [facility]  Disc SE in detail and SSRI withdrawal sx.  Extensive med discussion.  Discussed potential benefits, risks, and side effects of stimulants with patient to include increased heart rate, palpitations, insomnia, increased anxiety, increased irritability, or decreased appetite.  Instructed patient to contact Marisa if experiencing any significant tolerability issues.  FU 2 mos  Meredith Staggers MD, DFAPA  Future Appointments  Date Time Provider Department Center  09/03/2023  1:00 PM Quintella Reichert, MD [facility]  09/05/2023 11:15 AM Delton Coombes, Les Pou, MD [facility] None  09/13/2023  9:30 AM Napoleon Form, MD [facility]  10/01/2023 11:45 AM Marinus Maw, MD [facility]  03/17/2024  2:30 PM Amundson Shirley Friar, MD [facility] None    No orders of the defined types were placed in this encounter.     -------------------------------

## 2023-08-17 ENCOUNTER — Other Ambulatory Visit: Payer: Self-pay | Admitting: Psychiatry

## 2023-08-17 DIAGNOSIS — F331 Major depressive disorder, recurrent, moderate: Secondary | ICD-10-CM

## 2023-08-17 DIAGNOSIS — F900 Attention-deficit hyperactivity disorder, predominantly inattentive type: Secondary | ICD-10-CM

## 2023-08-18 LAB — LIPID PANEL
Chol/HDL Ratio: 2.1 ratio (ref 0.0–4.4)
Cholesterol, Total: 121 mg/dL (ref 100–199)
HDL: 57 mg/dL (ref >39)
LDL Chol Calc (NIH): 41 mg/dL (ref 0–99)
Triglycerides: 133 mg/dL (ref 0–149)
VLDL Cholesterol Cal: 23 mg/dL (ref 5–40)

## 2023-08-20 ENCOUNTER — Telehealth: Payer: Self-pay

## 2023-08-20 NOTE — Telephone Encounter (Signed)
-----   Message from Armanda Magic sent at 08/19/2023  8:26 AM EDT ----- Lipids at goal continue current therapy and forward to PCP

## 2023-08-20 NOTE — Telephone Encounter (Signed)
 The patient has been notified of the result and verbalized understanding.  All questions (if any) were answered. Lab results were routed to pt's PCP. Erick Alley, RN 08/20/2023 4:41 PM

## 2023-09-03 ENCOUNTER — Encounter: Payer: Self-pay | Admitting: Cardiology

## 2023-09-03 ENCOUNTER — Ambulatory Visit: Payer: BC Managed Care – PPO | Attending: Cardiology | Admitting: Cardiology

## 2023-09-03 VITALS — BP 88/62 | HR 53 | Ht 65.0 in | Wt 200.4 lb

## 2023-09-03 DIAGNOSIS — I493 Ventricular premature depolarization: Secondary | ICD-10-CM | POA: Diagnosis not present

## 2023-09-03 DIAGNOSIS — I2510 Atherosclerotic heart disease of native coronary artery without angina pectoris: Secondary | ICD-10-CM | POA: Diagnosis not present

## 2023-09-03 DIAGNOSIS — R002 Palpitations: Secondary | ICD-10-CM | POA: Diagnosis not present

## 2023-09-03 DIAGNOSIS — E785 Hyperlipidemia, unspecified: Secondary | ICD-10-CM

## 2023-09-03 DIAGNOSIS — G4733 Obstructive sleep apnea (adult) (pediatric): Secondary | ICD-10-CM

## 2023-09-03 DIAGNOSIS — I341 Nonrheumatic mitral (valve) prolapse: Secondary | ICD-10-CM

## 2023-09-03 NOTE — Patient Instructions (Addendum)
 Medication Instructions:  Your physician has recommended you make the following change in your medication: 1. STOP CRESTOR  FOR 2 WEEKS AND THEN CALL OUR OFFICE TO LET US  KNOW IF THE SYMPTOMS GOT BETTER.  *If you need a refill on your cardiac medications before your next appointment, please call your pharmacy*  Lab Work: NONE If you have labs (blood work) drawn today and your tests are completely normal, you will receive your results only by: MyChart Message (if you have MyChart) OR A paper copy in the mail If you have any lab test that is abnormal or we need to change your treatment, we will call you to review the results.  Testing/Procedures: Your physician has recommended that you have a ITAMAR sleep study. This test records several body functions during sleep, including: brain activity, eye movement, oxygen and carbon dioxide blood levels, heart rate and rhythm, breathing rate and rhythm, the flow of air through your mouth and nose, snoring, body muscle movements, and chest and belly movement.  WatchPAT? is an FDA-cleared portable home sleep study test that uses a watch and 3 points of contact to monitor 7 different channels, including your heart rate, oxygen saturation, body position, snoring, and chest motion.  The study is easy to use from the comfort of your own home and accurately detect sleep apnea.  Before bed, you attach the chest sensor, attached the sleep apnea bracelet to your nondominant hand, and attach the finger probe.  After the study, the raw data is downloaded from the watch and scored for apnea events.   For more information: [email or web address]  Patient Testing Instructions:  Once our office has received insurance approval for you to complete this test, we will contact you with a PIN to activate the device.  This typically takes 2-3 weeks.  Please do not open the box until approved.   Do not put battery into the device until bedtime when you are ready  to begin the test. Please call the support number if you need assistance after following the instructions below: 24 hour support line- [phone] or ITAMAR support at [phone] (option 2)  Download the [facility] [facility]" app through the [employer] or [employer]. Be sure to turn on or enable access to Bluetooth in settings on your smartphone. Make sure no other Bluetooth devices are on and within the vicinity of your smartphone and WatchPAT watch during testing.  Make sure to leave your smart phone plugged in and charging all night.  When ready for bed:  Follow the instructions step by step in the WatchPAT One app to activate the testing device. For additional instructions, including video instruction, visit the WatchPAT One video on Youtube. You can search for WatchPAT One within Youtube (video is 4 minutes and 18 seconds) or enter: https://youtube/watch?[email or web address] Please note: You will be prompted to enter a PIN to connect via Bluetooth when starting the test. The PIN will be assigned to you after insurance has approved the test.  The device is disposable, but it recommended that you retain the device until you receive a call letting you know the study has been received and the results have been interpreted.  We will let you know if the study did not transmit to us  properly after the test is completed. You do not need to call us  to confirm the receipt of the test.  Please complete the test within 48 hours of receiving PIN.   Frequently Asked Questions:  What is Watch PAT One?  A single use, fully disposable home sleep apnea testing device and will not need to be returned after completion.  What are the requirements to use WatchPAT One?  A successful WatchPAT One sleep study requires a WatchPAT One device, your smart phone, WatchPAT One app, your PIN number, and internet access. What type of phone do I need?  You should have a smart phone that uses Android 5.1 and above or  any iPhone with IOS 10 and above. How can I download the WatchPAT one app?  Based on your device type search for WatchPAT One app either in [employer] for [employer] or [employer] for [employer]. Where will I get my PIN for the study?  Your PIN will be provided by your physician's office after insurance has approved the test. This process typically takes 2-3 weeks. It is used for authentication and if you lose/forget your PIN, please reach out to your provider's office.  I do not have internet at home. Can I still complete a WatchPAT One study?  WatchPAT One needs internet connection throughout the night to be able to transmit the sleep data. You can use your home/local internet or your cellular data package. However, it is always recommended to use home/local internet. It is estimated that between 20MB-30MB of data will be used with each study, but the application will be looking for space in the phone to start the study.  What happens if I lose internet or Bluetooth connection?  During the internet disconnection, your phone will not be able to transmit the sleep data.  All the data, will be stored in your phone.  As soon as the internet connection is back on, the phone will resume sending the sleep data. During the Bluetooth disconnection, WatchPAT One will not be able to to send the sleep data to your phone.  Data will be kept in the WatchPAT One until both devices have Bluetooth connection back on.  As soon as the connection is back on, WatchPAT one will send the sleep data to the phone.  How long do I need to wear the WatchPAT one?  After you start the study, you should wear the device at least 6 hours.  How far should I keep my phone from the device?  During the night, your phone should remain within 15 feet of where you sleep.  What happens if I leave the room for restroom or other reasons?  Leaving the room for any reason will not cause any problem. As soon as your get back  to the room, both devices will reconnect and will continue to send the sleep data. Can I use my phone during the sleep study?  Yes, you can use your phone as usual during the study. But it is recommended to put your WatchPAT One on when you are ready to go to bed.  How will I get my study results?  A soon as you completed your study, your sleep data will be sent to the provider. They will then share the results with you when they are ready.   Follow-Up: At [facility], you and your health needs are our priority.  As part of our continuing mission to provide you with exceptional heart care, our providers are all part of one team.  This team includes your primary Cardiologist (physician) and Advanced Practice Providers or APPs (Physician Assistants and Nurse Practitioners) who all work together to provide you with the care you need, when you  need it.  Your next appointment:   1 year(s)  Provider:   DR. Micael Adas  We recommend signing up for the patient portal called "MyChart".  Sign up information is provided on this After Visit Summary.  MyChart is used to connect with patients for Virtual Visits (Telemedicine).  Patients are able to view lab/test results, encounter notes, upcoming appointments, etc.  Non-urgent messages can be sent to your provider as well.   To learn more about what you can do with MyChart, go to [email or web address].   Other Instructions       1st Floor: - Lobby - Registration  - Pharmacy  - Lab - Cafe  2nd Floor: - PV Lab - Diagnostic Testing (echo, CT, nuclear med)  3rd Floor: - Vacant  4th Floor: - TCTS (cardiothoracic surgery) - AFib Clinic - Structural Heart Clinic - Vascular Surgery  - Vascular Ultrasound  5th Floor: - HeartCare Cardiology (general and EP) - Clinical Pharmacy for coumadin, hypertension, lipid, weight-loss medications, and med management appointments    Valet parking services will be available as well.

## 2023-09-03 NOTE — Progress Notes (Signed)
 Cardiology Note    Date:  09/03/2023   ID:  Marisa Hamilton, Marisa Hamilton 09-06-1960, MRN [id number]  PCP:  Bufford Carne, FNP  Cardiologist:  Gaylyn Keas, MD   Chief Complaint  Patient presents with   Coronary Artery Disease   Hypertension   Hyperlipidemia   Palpitations    History of Present Illness:  Marisa Hamilton is a 63 y.o. female with a hx of depression, IBS, UC and RLS.  She was initially referred to me for evaluation of palpitate as well as issues with chest pain and heaviness.  She underwent a coronary CTA in 06/04/2023 which showed a coronary calcium  score of 0 with very mild total plaque volume of 71 mm3, < 25% soft plaque prox RCA and 50% noncalcified plaque prox LAD with normal FFR of the proximal LAD.    2D echo 06/04/2023 showed EF 55 to 60% with normal wall motion, moderate LAE, mild LAE RAE, mild MVP of the posterior leaflet with mild MR. Heart monitor 05/04/2023 showed normal sinus rhythm with multiple episodes of SVT lasting as long as 41 seconds, frequent PVCs with PVC load 17.6%.  She was started on Toprol  XL 25 mg daily.  Seen by Dr. Carolynne Citron with EP for evaluation of high PVC load.  Recommendation was to continue beta-blocker and uptitrate if needed.  She is here today for followup and is doing well.  She denies any chest pain or pressure, PND, orthopnea, LE edema, dizziness, palpitations or syncope. She continues to have DOE and fatigued.  She has an appt with Pulmonary for workup of non cardiac SOB. She is compliant with her meds and is tolerating meds with no SE.  She has been complaining of severe daytime sleepiness.  She did not have OSA on a sleep study years ago.  Her husband tells her she snores.  Past Medical History:  Diagnosis Date   Anxiety    Back pain    CAD (coronary artery disease), native coronary artery    50% prox LAD and <25% prox RCA>>noncalcified plaque by CTA 05/2023   CFS (chronic fatigue syndrome)    Concussion 03/17/2017    Constipation    Cystitis, interstitial    HAD BLADDER SX FOR SAME   Depression    Fainting spell    Food allergy     Foot pain    GERD (gastroesophageal reflux disease)    Headache(784.0)    IBS (irritable bowel syndrome)    TAKES PROBIOTICS   Insomnia    Lactose intolerance    Leg pain    Migraine    MVP (mitral valve prolapse)    Mild posterior mitral valve prolapse with mild MR by echo 05/2023   Nausea    Neck pain    PAT (paroxysmal atrial tachycardia) (HCC)    Noted on heart monitor 04/2023   PONV (postoperative nausea and vomiting)    PVC's (premature ventricular contractions)    PVC load 17.6% by heart monitor 04/2023   Recurrent upper respiratory infection (URI)    Restless leg syndrome    Stomach ache    Ulcerative colitis (HCC)    Varicose veins     Past Surgical History:  Procedure Laterality Date   ABDOMINOPLASTY  08/2012   BLADDER SURGERY     FOR IC   BREAST BIOPSY Left 02/26/2012   negative   BREAST REDUCTION SURGERY  10/10/2011   Procedure: MAMMARY REDUCTION  (BREAST);  Surgeon: Doylene Genet, MD;  Location:  [facility];  Service: Plastics;  Laterality: Bilateral;   COLONOSCOPY  10/22/2015   Dr.Mann   ESOPHAGOGASTRODUODENOSCOPY ENDOSCOPY     LASIK     NASAL SEPTUM SURGERY     REDUCTION MAMMAPLASTY Bilateral    VASCULAR SURGERY     VARICOSE VEINS   WRIST ARTHROSCOPY WITH CARPOMETACARPEL [facility]) ARTHROPLASTY Left 10/20/2022    Current Medications: Current Meds  Medication Sig   Armodafinil  150 MG tablet Take 1 tablet (150 mg total) by mouth daily.   aspirin EC 81 MG tablet Take 81 mg by mouth daily. Swallow whole.   chlorproMAZINE (THORAZINE) 25 MG tablet PRN   clonazePAM  (KLONOPIN ) 1 MG tablet Take 1 tablet (1 mg total) by mouth at bedtime.   cyanocobalamin  (VITAMIN B12) 1000 MCG tablet Take 1,000 mcg by mouth daily.   Dextromethorphan-buPROPion  ER (AUVELITY ) 45-105 MG TBCR Take 1 tablet by mouth 2 (two) times daily.    famotidine  (PEPCID ) 20 MG tablet Take 20 mg by mouth daily as needed for heartburn or indigestion. Pt taking in AM and PM   ketorolac  (TORADOL ) 60 MG/2ML SOLN injection Inject 60 mg into the muscle as needed (migraines).    Lasmiditan Succinate (REYVOW) 100 MG TABS Take 100 mg by mouth as needed.   metoprolol  succinate (TOPROL  XL) 25 MG 24 hr tablet Take 1 tablet (25 mg total) by mouth daily.   ondansetron  (ZOFRAN ) 4 MG tablet TAKE 1 TABLET BY MOUTH EVERY 8 HOURS AS NEEDED FOR NAUSEA AND VOMITING   pantoprazole  (PROTONIX ) 40 MG tablet Take 1 tablet (40 mg total) by mouth 2 (two) times daily before a meal.   rizatriptan (MAXALT) 10 MG tablet Take 10 mg by mouth as needed for migraine.   rosuvastatin  (CRESTOR ) 10 MG tablet Take 1 tablet (10 mg total) by mouth daily.   sertraline  (ZOLOFT ) 25 MG tablet Take 1.5 tablets (37.5 mg total) by mouth daily.   simethicone  (MYLICON) 125 MG chewable tablet Chew 125 mg by mouth every 6 (six) hours as needed for flatulence.   Sodium Sulfate-Mag Sulfate-KCl (SUTAB ) 1479-225-188 MG TABS Use as directed for colonoscopy. MANUFACTURER CODES!! BIN: [id number] PCN: CN GROUP: [id number] MEMBER ID: [id number];[employer] AS SECONDARY INSURANCE ;NO PRIOR AUTHORIZATION   tiZANidine  (ZANAFLEX ) 4 MG tablet Take 1 tablet (4 mg total) by mouth at bedtime.   topiramate  (TOPAMAX ) 50 MG tablet Take 1 tablet (50 mg total) by mouth 2 (two) times daily. (Patient taking differently: Take 75 mg by mouth every evening.)   traZODone  (DESYREL ) 100 MG tablet Take 1 tablet (100 mg total) by mouth at bedtime.   VITAMIN D  PO Take 4,000 Units by mouth daily.   Wheat Dextrin (BENEFIBER PO) Take by mouth.    Allergies:   Selenium dioxide [selenium], Gluten meal, Milk (cow), Nsaids, Phentermine, Amitriptyline hcl, Bacid, Meloxicam, and Milk-related compounds   Social History   Socioeconomic History   Marital status: Married    Spouse name: [name]   Number of children: 2   Years of education: Not  on file   Highest education level: Not on file  Occupational History   Occupation: Retired [employer] features editor  Tobacco Use   Smoking status: Never   Smokeless tobacco: Never  Vaping Use   Vaping status: Never Used  Substance and Sexual Activity   Alcohol use: Not Currently   Drug use: No   Sexual activity: Yes    Partners: Male    Birth control/protection: Other-see comments, Post-menopausal    Comment: husband vasectomy  Other Topics Concern   Not on file  Social History Narrative   Not on file   Social Drivers of Health   Financial Resource Strain: Low Risk  (07/09/2023)   Received from [facility]   Overall Financial Resource Strain (CARDIA)    Difficulty of Paying Living Expenses: Not hard at all  Food Insecurity: No Food Insecurity (07/09/2023)   Received from [facility]   Hunger Vital Sign    Worried About Running Out of Food in the Last Year: Never true    Ran Out of Food in the Last Year: Never true  Transportation Needs: No Transportation Needs (07/09/2023)   Received from [facility] - Transportation    Lack of Transportation (Medical): No    Lack of Transportation (Non-Medical): No  Physical Activity: Unknown (07/09/2023)   Received from [facility]   Exercise Vital Sign    Days of Exercise per Week: 0 days    Minutes of Exercise per Session: Not on file  Stress: No Stress Concern Present (07/09/2023)   Received from [facility] - Occupational Stress Questionnaire    Feeling of Stress : Only a little  Social Connections: Socially Integrated (07/09/2023)   Received from [facility]   Social Network    How would you rate your social network (family, work, friends)?: Good participation with social networks     Family History:  The patient's family history includes Angioedema in her father; Anxiety disorder in her mother; Atrial fibrillation in her mother; Breast cancer in her maternal grandmother;  Cancer in her maternal grandmother; Depression in her father and mother; Diabetes in her father and paternal grandmother; Heart disease in her mother; Hypertension in her father; Migraines in her brother; Obesity in her father and mother; Osteoporosis in her mother; Stroke in her mother.   ROS:   Please see the history of present illness.    ROS All other systems reviewed and are negative.      No data to display             PHYSICAL EXAM:   VS:  BP (!) 88/62   Pulse (!) 53   Ht 5\' 5"  (1.651 m)   Wt 200 lb 6.4 oz (90.9 kg)   LMP 07/29/2012 Comment: spotting that week  SpO2 98%   BMI 33.35 kg/m    GEN: Well nourished, well developed in no acute distress HEENT: Normal NECK: No JVD; No carotid bruits LYMPHATICS: No lymphadenopathy CARDIAC:RRR, no murmurs, rubs, gallops with occasional ectopy RESPIRATORY:  Clear to auscultation without rales, wheezing or rhonchi  ABDOMEN: Soft, non-tender, non-distended MUSCULOSKELETAL:  No edema; No deformity  SKIN: Warm and dry NEUROLOGIC:  Alert and oriented x 3 PSYCHIATRIC:  Normal affect  Wt Readings from Last 3 Encounters:  09/03/23 200 lb 6.4 oz (90.9 kg)  07/11/23 197 lb (89.4 kg)  06/27/23 193 lb 9.6 oz (87.8 kg)      Studies/Labs Reviewed:         Recent Labs: 03/23/2023: BUN 15; Creatinine, Ser 0.89; Magnesium 1.9; Potassium 4.5; Sodium 140; TSH 2.190 06/12/2023: ALT 14   Lipid Panel    Component Value Date/Time   CHOL 121 08/17/2023 1205   TRIG 133 08/17/2023 1205   HDL 57 08/17/2023 1205   CHOLHDL 2.1 08/17/2023 1205   CHOLHDL 2.7 08/04/2013 1100   VLDL 34 08/04/2013 1100   LDLCALC 41 08/17/2023 1205    ASSESSMENT:  1. Palpitations   2. PVC's (premature ventricular contractions)   3. Coronary artery disease, unspecified vessel or lesion type, unspecified whether angina present, unspecified whether native or transplanted heart   4. Mitral valve prolapse   5. Hyperlipidemia, unspecified hyperlipidemia type        PLAN:  In order of problems listed above:  #Palpitations #SVT #PVCs  -Heart monitor showed a PVC load of 17% and started on Toprol  XL 25 mg daily -Seen by EP who recommended up titration of beta-blocker for breakthrough palpitations -2D echo showed normal LV function with mitral valve prolapse of the posterior mitral valve leaflet -Her PVCs are well-controlled on beta-blocker therapy -Continue Toprol  XL 25 mg daily with as needed refills  #Non Obstructive ASCAD -Coronary CTA showed coronary calcium  score of 0 with very mild total plaque volume of 71 mm3, < 25% soft plaque prox RCA and 50% noncalcified plaque prox LAD with normal FFR of the proximal LAD -She has not had any further chest discomfort -Continue prescription drug management with aspirin 81 mg daily, Toprol  XL 25 mg daily, Crestor  10 mg daily with as needed refills  Hx of MVP - 2D echo 2016 showed MVP of PMVL with mild MR - 2D echo 05/2023 showed mild MVP of the posterior mitral valve leaflet with mild MR - Repeat echo in 2 years  #Hyperlipidemia -LDL less than 70 -I have personally reviewed and interpreted outside labs performed by patient's PCP which showed LDL 41, HDL 57 on 08/17/2023 and ALT 14 on 06/12/2023 -she has had some achiness in her right arm but not sure it started when she started the statin>>I instructed her to hold Crestor  for 2 weeks to see if it resolves and if it does then we will refer to lipid clinic.  If it does not resolve then I told her to restart the Crestor .  #Excessive Daytime Sleepiness -she has had severe fatigue and excessive sleepiness during the day -I will get an Itamar HST to assess for OSA  Time Spent: 20 minutes total time of encounter, including 15 minutes spent in face-to-face patient care on the date of this encounter. This time includes coordination of care and counseling regarding above mentioned problem list. Remainder of non-face-to-face time involved reviewing chart  documents/testing relevant to the patient encounter and documentation in the medical record. I have independently reviewed documentation from referring provider  Followup:  1 year  Medication Adjustments/Labs and Tests Ordered: Current medicines are reviewed at length with the patient today.  Concerns regarding medicines are outlined above.  Medication changes, Labs and Tests ordered today are listed in the Patient Instructions below.  Patient Instructions  Medication Instructions:  Your physician has recommended you make the following change in your medication: 1. STOP CRESTOR  FOR 2 WEEKS AND THEN CALL OUR OFFICE TO LET US  KNOW IF THE SYMPTOMS GOT BETTER.  *If you need a refill on your cardiac medications before your next appointment, please call your pharmacy*  Lab Work: NONE If you have labs (blood work) drawn today and your tests are completely normal, you will receive your results only by: MyChart Message (if you have MyChart) OR A paper copy in the mail If you have any lab test that is abnormal or we need to change your treatment, we will call you to review the results.  Testing/Procedures: Your physician has recommended that you have a ITAMAR sleep study. This test records several body functions during sleep, including: brain activity, eye movement, oxygen and carbon dioxide  blood levels, heart rate and rhythm, breathing rate and rhythm, the flow of air through your mouth and nose, snoring, body muscle movements, and chest and belly movement.  Follow-Up: At [facility], you and your health needs are our priority.  As part of our continuing mission to provide you with exceptional heart care, our providers are all part of one team.  This team includes your primary Cardiologist (physician) and Advanced Practice Providers or APPs (Physician Assistants and Nurse Practitioners) who all work together to provide you with the care you need, when you need it.  Your next appointment:    1 year(s)  Provider:   DR. Micael Adas  We recommend signing up for the patient portal called "MyChart".  Sign up information is provided on this After Visit Summary.  MyChart is used to connect with patients for Virtual Visits (Telemedicine).  Patients are able to view lab/test results, encounter notes, upcoming appointments, etc.  Non-urgent messages can be sent to your provider as well.   To learn more about what you can do with MyChart, go to [email or web address].   Other Instructions       1st Floor: - Lobby - Registration  - Pharmacy  - Lab - Cafe  2nd Floor: - PV Lab - Diagnostic Testing (echo, CT, nuclear med)  3rd Floor: - Vacant  4th Floor: - TCTS (cardiothoracic surgery) - AFib Clinic - Structural Heart Clinic - Vascular Surgery  - Vascular Ultrasound  5th Floor: - HeartCare Cardiology (general and EP) - Clinical Pharmacy for coumadin, hypertension, lipid, weight-loss medications, and med management appointments    Valet parking services will be available as well.      Signed, Gaylyn Keas, MD  09/03/2023 2:16 PM    [facility] [address] [place], [place], [place]  [address] Phone: [phone]; Fax: [phone]

## 2023-09-04 ENCOUNTER — Telehealth: Payer: Self-pay | Admitting: Radiology

## 2023-09-04 NOTE — Telephone Encounter (Signed)
 Patient agreement reviewed and signed on 09/04/2023.  WatchPAT issued to patient on 09/04/2023 by Ilda Malkin B. Patient aware to not open the WatchPAT box until contacted with the activation PIN. Patient profile initialized in CloudPAT on 09/04/2023 by Jenise Mixer. Device serial number: [id number]

## 2023-09-05 ENCOUNTER — Encounter: Payer: Self-pay | Admitting: Emergency Medicine

## 2023-09-05 ENCOUNTER — Ambulatory Visit: Payer: BC Managed Care – PPO | Admitting: Emergency Medicine

## 2023-09-05 ENCOUNTER — Encounter: Payer: Self-pay | Admitting: Gastroenterology

## 2023-09-05 VITALS — BP 128/66 | HR 67 | Ht 65.0 in | Wt 202.2 lb

## 2023-09-05 DIAGNOSIS — R918 Other nonspecific abnormal finding of lung field: Secondary | ICD-10-CM | POA: Diagnosis not present

## 2023-09-05 DIAGNOSIS — R0602 Shortness of breath: Secondary | ICD-10-CM | POA: Diagnosis not present

## 2023-09-05 DIAGNOSIS — J309 Allergic rhinitis, unspecified: Secondary | ICD-10-CM

## 2023-09-05 MED ORDER — FLUTICASONE PROPIONATE 50 MCG/ACT NA SUSP: 2.0000 | Freq: Every day | NASAL | 2 refills | Status: DC

## 2023-09-05 NOTE — Progress Notes (Signed)
 Subjective:    Patient ID: Marisa Hamilton, female    DOB: 1960/06/17, 63 y.o.   MRN: [id number]  HPI  ROV 09/05/23 --follow-up visit for 63 year old woman whom I have seen in the past for pulmonary nodules on CT scan of the chest.  She had large-scale resolution of scattered pulmonary nodules these following COVID-19 but there was a residual 7 mm right upper lobe nodule that was stable over a 41-month interval, last scan was 11/2021.  We repeated her CT chest to ensure stability as below.  She reported dyspnea, had an asthma-like syndrome following her COVID-19.  We ordered pulmonary function testing but these have not scheduled yet. She is still having exertional SOB with any activity. Has been on BD before - may have benefited. Not very active, may have a component of deconditioning. She has a lot of rhinitis.   CT scan of the chest 07/24/2023 reviewed by me shows bandlike right upper lobe subpleural opacity with a nodular component 7 x 4 mm that is unchanged going back to 11/2021.  Some scattered left upper lobe clustered nodularity, unchanged, central right upper lobe calcified granuloma.  Overall stable CT scan.   Review of Systems As per HPI  Past Medical History:  Diagnosis Date   Anxiety    Back pain    CAD (coronary artery disease), native coronary artery    50% prox LAD and <25% prox RCA>>noncalcified plaque by CTA 05/2023   CFS (chronic fatigue syndrome)    Concussion 03/17/2017   Constipation    Cystitis, interstitial    HAD BLADDER SX FOR SAME   Depression    Fainting spell    Food allergy     Foot pain    GERD (gastroesophageal reflux disease)    Headache(784.0)    IBS (irritable bowel syndrome)    TAKES PROBIOTICS   Insomnia    Lactose intolerance    Leg pain    Migraine    MVP (mitral valve prolapse)    Mild posterior mitral valve prolapse with mild MR by echo 05/2023   Nausea    Neck pain    PAT (paroxysmal atrial tachycardia) (HCC)    Noted on heart monitor  04/2023   PONV (postoperative nausea and vomiting)    PVC's (premature ventricular contractions)    PVC load 17.6% by heart monitor 04/2023   Recurrent upper respiratory infection (URI)    Restless leg syndrome    Stomach ache    Ulcerative colitis (HCC)    Varicose veins      Family History  Problem Relation Age of Onset   Osteoporosis Mother    Atrial fibrillation Mother    Heart disease Mother    Stroke Mother    Depression Mother    Anxiety disorder Mother    Obesity Mother    Angioedema Father    Diabetes Father    Hypertension Father    Depression Father    Obesity Father    Migraines Brother        CHRONIC   Breast cancer Maternal Grandmother    Cancer Maternal Grandmother        ovarian   Diabetes Paternal Grandmother    Colon cancer Neg Hx    Esophageal cancer Neg Hx    Pancreatic cancer Neg Hx    Stomach cancer Neg Hx    Liver disease Neg Hx    Rectal cancer Neg Hx    Colon polyps Neg Hx  Social History   Socioeconomic History   Marital status: Married    Spouse name: Chip Bobby   Number of children: 2   Years of education: Not on file   Highest education level: Not on file  Occupational History   Occupation: Retired Magazine features editor  Tobacco Use   Smoking status: Never   Smokeless tobacco: Never  Vaping Use   Vaping status: Never Used  Substance and Sexual Activity   Alcohol use: Not Currently   Drug use: No   Sexual activity: Yes    Partners: Male    Birth control/protection: Other-see comments, Post-menopausal    Comment: husband vasectomy  Other Topics Concern   Not on file  Social History Narrative   Not on file   Social Drivers of Health   Financial Resource Strain: Low Risk  (07/09/2023)   Received from [facility]   Overall Financial Resource Strain (CARDIA)    Difficulty of Paying Living Expenses: Not hard at all  Food Insecurity: No Food Insecurity (07/09/2023)   Received from [facility]   Hunger Vital Sign     Worried About Running Out of Food in the Last Year: Never true    Ran Out of Food in the Last Year: Never true  Transportation Needs: No Transportation Needs (07/09/2023)   Received from [facility] - Transportation    Lack of Transportation (Medical): No    Lack of Transportation (Non-Medical): No  Physical Activity: Unknown (07/09/2023)   Received from [facility]   Exercise Vital Sign    Days of Exercise per Week: 0 days    Minutes of Exercise per Session: Not on file  Stress: No Stress Concern Present (07/09/2023)   Received from [name] - Occupational Stress Questionnaire    Feeling of Stress : Only a little  Social Connections: Socially Integrated (07/09/2023)   Received from [facility]   Social Network    How would you rate your social network (family, work, friends)?: Good participation with social networks  Intimate Partner Violence: Not At Risk (07/09/2023)   Received from [facility]   HITS    Over the last 12 months how often did your partner physically hurt you?: Never    Over the last 12 months how often did your partner insult you or talk down to you?: Never    Over the last 12 months how often did your partner threaten you with physical harm?: Never    Over the last 12 months how often did your partner scream or curse at you?: Never     Allergies  Allergen Reactions   Selenium Dioxide [Selenium] Anaphylaxis   Gluten Meal Other (See Comments)    Stomach upset Stomach upset Stomach upset   Milk (Cow) Other (See Comments)    Stomach upset   Nsaids Other (See Comments)    Other reaction(s): Abdominal Pain Ulcerative colitis Other reaction(s): Abdominal Pain Ulcerative colitis Ulcerative colitis Other reaction(s): Abdominal Pain Ulcerative colitis   Phentermine Other (See Comments)    Pt stated, "Made my IC flare up" Pt stated, "Made my IC flare up" Pt stated, "Made my IC flare up"    Amitriptyline Hcl Other (See Comments)   Bacid Other (See Comments)   Meloxicam Nausea And Vomiting   Milk-Related Compounds Other (See Comments)    Stomach upset     Outpatient Medications Prior to Visit  Medication Sig Dispense Refill   Armodafinil  150 MG  tablet Take 1 tablet (150 mg total) by mouth daily. 30 tablet 0   aspirin EC 81 MG tablet Take 81 mg by mouth daily. Swallow whole.     chlorproMAZINE (THORAZINE) 25 MG tablet PRN     clonazePAM  (KLONOPIN ) 1 MG tablet Take 1 tablet (1 mg total) by mouth at bedtime. 90 tablet 0   cyanocobalamin  (VITAMIN B12) 1000 MCG tablet Take 1,000 mcg by mouth daily.     Dextromethorphan-buPROPion  ER (AUVELITY ) 45-105 MG TBCR Take 1 tablet by mouth 2 (two) times daily. 60 tablet 2   famotidine  (PEPCID ) 20 MG tablet Take 20 mg by mouth daily as needed for heartburn or indigestion. Pt taking in AM and PM     ketorolac  (TORADOL ) 60 MG/2ML SOLN injection Inject 60 mg into the muscle as needed (migraines).      Lasmiditan Succinate (REYVOW) 100 MG TABS Take 100 mg by mouth as needed.     metoprolol  succinate (TOPROL  XL) 25 MG 24 hr tablet Take 1 tablet (25 mg total) by mouth daily. 90 tablet 3   ondansetron  (ZOFRAN ) 4 MG tablet TAKE 1 TABLET BY MOUTH EVERY 8 HOURS AS NEEDED FOR NAUSEA AND VOMITING 20 tablet 0   pantoprazole  (PROTONIX ) 40 MG tablet Take 1 tablet (40 mg total) by mouth 2 (two) times daily before a meal. 180 tablet 3   rizatriptan (MAXALT) 10 MG tablet Take 10 mg by mouth as needed for migraine.     rosuvastatin  (CRESTOR ) 10 MG tablet Take 1 tablet (10 mg total) by mouth daily. 90 tablet 3   sertraline  (ZOLOFT ) 25 MG tablet Take 1.5 tablets (37.5 mg total) by mouth daily. 135 tablet 0   simethicone  (MYLICON) 125 MG chewable tablet Chew 125 mg by mouth every 6 (six) hours as needed for flatulence.     Sodium Sulfate-Mag Sulfate-KCl (SUTAB ) 1479-225-188 MG TABS Use as directed for colonoscopy. MANUFACTURER CODES!! BIN: [id number] PCN: CN GROUP:  [id number] MEMBER ID: [id number];GNF AS SECONDARY INSURANCE ;NO PRIOR AUTHORIZATION 24 tablet 0   tiZANidine  (ZANAFLEX ) 4 MG tablet Take 1 tablet (4 mg total) by mouth at bedtime. 30 tablet 1   topiramate  (TOPAMAX ) 50 MG tablet Take 1 tablet (50 mg total) by mouth 2 (two) times daily. (Patient taking differently: Take 75 mg by mouth every evening.) 60 tablet 0   traZODone  (DESYREL ) 100 MG tablet Take 1 tablet (100 mg total) by mouth at bedtime. 90 tablet 0   VITAMIN D  PO Take 4,000 Units by mouth daily.     Wheat Dextrin (BENEFIBER PO) Take by mouth.     No facility-administered medications prior to visit.         Objective:   Physical Exam Vitals:   09/05/23 1131  BP: 128/66  Pulse: 67  SpO2: 96%  Weight: 202 lb 3.2 oz (91.7 kg)  Height: 5\' 5"  (1.651 m)      Gen: Pleasant, well-nourished, in no distress,  normal affect  ENT: No lesions,  mouth clear,  oropharynx clear, no postnasal drip  Neck: No JVD, no stridor  Lungs: No use of accessory muscles, no crackles or wheezing within normal breath.  She does have some end expiratory wheeze and develops cough with a forced expiration  Cardiovascular: RRR, heart sounds normal, no murmur or gallops, no peripheral edema  Musculoskeletal: No deformities, no cyanosis or clubbing  Neuro: alert, awake, non focal  Skin: Warm, no lesions or rash      Assessment & Plan:  Pulmonary nodules We reviewed  your CT scan of the chest today.  Your pulmonary nodule is unchanged, stable.  Good news.  We should not need to do any repeat scans to follow this.  Allergic rhinitis Please start loratadine 10 mg (generic Claritin) once daily Please start fluticasone  nasal spray, 2 sprays each nostril once daily. Agree with nasal saline rinses as you have been doing them  Dyspnea We will work on scheduling your pulmonary function testing Please follow-up with Dr. Baldwin Levee in about 2 months so we can review your pulmonary function testing at that  time    Racheal Buddle, MD, PhD 09/05/2023, 4:41 PM [facility] [phone] or if no answer before 7:00PM call [phone] For any issues after 7:00PM please call eLink [phone]

## 2023-09-05 NOTE — Assessment & Plan Note (Signed)
 We will work on scheduling your pulmonary function testing Please follow-up with Dr. Baldwin Levee in about 2 months so we can review your pulmonary function testing at that time

## 2023-09-05 NOTE — Assessment & Plan Note (Signed)
 We reviewed your CT scan of the chest today.  Your pulmonary nodule is unchanged, stable.  Good news.  We should not need to do any repeat scans to follow this.

## 2023-09-05 NOTE — Patient Instructions (Signed)
 We reviewed your CT scan of the chest today.  Your pulmonary nodule is unchanged, stable.  Good news.  We should not need to do any repeat scans to follow this. Please start loratadine 10 mg (generic Claritin) once daily Please start fluticasone  nasal spray, 2 sprays each nostril once daily. Agree with nasal saline rinses as you have been doing them We will work on scheduling your pulmonary function testing Please follow-up with Dr. Baldwin Levee in about 2 months so we can review your pulmonary function testing at that time

## 2023-09-05 NOTE — Assessment & Plan Note (Signed)
 Please start loratadine 10 mg (generic Claritin) once daily Please start fluticasone  nasal spray, 2 sprays each nostril once daily. Agree with nasal saline rinses as you have been doing them

## 2023-09-06 ENCOUNTER — Other Ambulatory Visit (HOSPITAL_BASED_OUTPATIENT_CLINIC_OR_DEPARTMENT_OTHER): Payer: Self-pay

## 2023-09-06 ENCOUNTER — Other Ambulatory Visit: Payer: Self-pay

## 2023-09-06 ENCOUNTER — Ambulatory Visit

## 2023-09-06 DIAGNOSIS — R0602 Shortness of breath: Secondary | ICD-10-CM

## 2023-09-06 LAB — PULMONARY FUNCTION TEST
DL/VA % pred: 80 %
DL/VA: 3.32 ml/min/mmHg/L
DLCO unc % pred: 88 %
DLCO unc: 18.31 ml/min/mmHg
FEF 25-75 Post: 3.2 L/s
FEF 25-75 Pre: 2.18 L/s
FEF2575-%Change-Post: 46 %
FEF2575-%Pred-Post: 143 %
FEF2575-%Pred-Pre: 97 %
FEV1-%Change-Post: 8 %
FEV1-%Pred-Post: 111 %
FEV1-%Pred-Pre: 102 %
FEV1-Post: 2.83 L
FEV1-Pre: 2.6 L
FEV1FVC-%Change-Post: 2 %
FEV1FVC-%Pred-Pre: 99 %
FEV6-%Change-Post: 5 %
FEV6-%Pred-Post: 111 %
FEV6-%Pred-Pre: 105 %
FEV6-Post: 3.56 L
FEV6-Pre: 3.36 L
FEV6FVC-%Change-Post: 0 %
FEV6FVC-%Pred-Post: 103 %
FEV6FVC-%Pred-Pre: 102 %
FVC-%Change-Post: 5 %
FVC-%Pred-Post: 108 %
FVC-%Pred-Pre: 102 %
FVC-Post: 3.59 L
FVC-Pre: 3.4 L
Post FEV1/FVC ratio: 79 %
Post FEV6/FVC ratio: 99 %
Pre FEV1/FVC ratio: 77 %
Pre FEV6/FVC Ratio: 99 %
RV % pred: 120 %
RV: 2.54 L
TLC % pred: 120 %
TLC: 6.25 L

## 2023-09-06 NOTE — Progress Notes (Signed)
 Full pft performed today.

## 2023-09-06 NOTE — Patient Instructions (Signed)
 Full pft performed today.

## 2023-09-10 ENCOUNTER — Other Ambulatory Visit: Payer: Self-pay | Admitting: Psychiatry

## 2023-09-10 DIAGNOSIS — F331 Major depressive disorder, recurrent, moderate: Secondary | ICD-10-CM

## 2023-09-10 DIAGNOSIS — F900 Attention-deficit hyperactivity disorder, predominantly inattentive type: Secondary | ICD-10-CM

## 2023-09-10 NOTE — Telephone Encounter (Signed)
Due 5/2

## 2023-09-11 NOTE — Telephone Encounter (Signed)
 [facility] called requesting Rx for Armodafinil  150 mg.

## 2023-09-13 ENCOUNTER — Encounter: Payer: BC Managed Care – PPO | Admitting: Gastroenterology

## 2023-09-26 ENCOUNTER — Encounter: Payer: Self-pay | Admitting: Emergency Medicine

## 2023-09-26 ENCOUNTER — Ambulatory Visit: Admitting: Emergency Medicine

## 2023-09-26 VITALS — BP 110/76 | HR 58 | Ht 65.0 in | Wt 205.4 lb

## 2023-09-26 DIAGNOSIS — J324 Chronic pansinusitis: Secondary | ICD-10-CM | POA: Diagnosis not present

## 2023-09-26 DIAGNOSIS — R0602 Shortness of breath: Secondary | ICD-10-CM

## 2023-09-26 DIAGNOSIS — J329 Chronic sinusitis, unspecified: Secondary | ICD-10-CM | POA: Insufficient documentation

## 2023-09-26 DIAGNOSIS — J323 Chronic sphenoidal sinusitis: Secondary | ICD-10-CM | POA: Insufficient documentation

## 2023-09-26 NOTE — Patient Instructions (Addendum)
 Your pulmonary function testing is normal.  This is good news.  There is no evidence for asthma or restriction on the size of each breath you take. I believe that your shortness of breath is least in part due to some deconditioning since you have had reassuring cardiac and pulmonary evaluations.  We could consider ordering a cardiopulmonary exercise test at some point in the future if we believe this would be helpful.  Recommend discussing this with cardiology also. Continue your loratadine and fluticasone  nasal spray as you have been taking them.  Also continue nasal saline rinses.  There may be some benefit to going back to see ENT if you continue to have symptoms although I know you have had a reassuring exam there before. Follow in our office for any changes in your breathing, new respiratory symptoms. Follow with cardiology as planned

## 2023-09-26 NOTE — Assessment & Plan Note (Signed)
 Continue your loratadine and fluticasone  nasal spray as you have been taking them.  Also continue nasal saline rinses.  There may be some benefit to going back to see ENT if you continue to have symptoms although I know you have had a reassuring exam there before.

## 2023-09-26 NOTE — Progress Notes (Signed)
 Subjective:    Patient ID: Marisa Hamilton, female    DOB: 04-15-1961, 63 y.o.   MRN: [id number]  HPI  ROV 09/26/2023 --63 year old woman with a history of pulmonary nodule disease on CT chest that was stable on her most recent CT 07/2023, exertional shortness of breath.  She had an asthma-like syndrome following COVID-19.  Also with history of chronic allergic rhinitis.  Plan for pulmonary function testing.  I started her on loratadine and fluticasone  nasal spray when I saw her in April. Today she reports She doesn't believe that her nasal congestion is any better. Still difficult getting air in through her sinuses - she has seen ENT before with a reassuring exam. Also a reassuring allergy  eval.  She has gained 10 lbs over last several months. She is not very active, question component of deconditioning her.   Pulmonary function testing done 09/06/2023 reviewed by me, shows overall normal airflows with a borderline bronchodilator response, normal lung volumes, normal diffusion capacity, normal flow-volume loop   Review of Systems As per HPI  Past Medical History:  Diagnosis Date   Anxiety    Back pain    CAD (coronary artery disease), native coronary artery    50% prox LAD and <25% prox RCA>>noncalcified plaque by CTA 05/2023   CFS (chronic fatigue syndrome)    Concussion 03/17/2017   Constipation    Cystitis, interstitial    HAD BLADDER SX FOR SAME   Depression    Fainting spell    Food allergy     Foot pain    GERD (gastroesophageal reflux disease)    Headache(784.0)    IBS (irritable bowel syndrome)    TAKES PROBIOTICS   Insomnia    Lactose intolerance    Leg pain    Migraine    MVP (mitral valve prolapse)    Mild posterior mitral valve prolapse with mild MR by echo 05/2023   Nausea    Neck pain    PAT (paroxysmal atrial tachycardia) (HCC)    Noted on heart monitor 04/2023   PONV (postoperative nausea and vomiting)    PVC's (premature ventricular contractions)     PVC load 17.6% by heart monitor 04/2023   Recurrent upper respiratory infection (URI)    Restless leg syndrome    Stomach ache    Ulcerative colitis (HCC)    Varicose veins      Family History  Problem Relation Age of Onset   Osteoporosis Mother    Atrial fibrillation Mother    Heart disease Mother    Stroke Mother    Depression Mother    Anxiety disorder Mother    Obesity Mother    Angioedema Father    Diabetes Father    Hypertension Father    Depression Father    Obesity Father    Migraines Brother        CHRONIC   Breast cancer Maternal Grandmother    Cancer Maternal Grandmother        ovarian   Diabetes Paternal Grandmother    Colon cancer Neg Hx    Esophageal cancer Neg Hx    Pancreatic cancer Neg Hx    Stomach cancer Neg Hx    Liver disease Neg Hx    Rectal cancer Neg Hx    Colon polyps Neg Hx      Social History   Socioeconomic History   Marital status: Married    Spouse name: Chip Ihnen   Number of children: 2   Years of  education: Not on file   Highest education level: Not on file  Occupational History   Occupation: Retired Magazine features editor  Tobacco Use   Smoking status: Never   Smokeless tobacco: Never  Vaping Use   Vaping status: Never Used  Substance and Sexual Activity   Alcohol use: Not Currently   Drug use: No   Sexual activity: Yes    Partners: Male    Birth control/protection: Other-see comments, Post-menopausal    Comment: husband vasectomy  Other Topics Concern   Not on file  Social History Narrative   Not on file   Social Drivers of Health   Financial Resource Strain: Low Risk  (07/09/2023)   Received from [facility]   Overall Financial Resource Strain (CARDIA)    Difficulty of Paying Living Expenses: Not hard at all  Food Insecurity: No Food Insecurity (07/09/2023)   Received from [facility]   Hunger Vital Sign    Worried About Running Out of Food in the Last Year: Never true    Ran Out of Food in the Last Year:  Never true  Transportation Needs: No Transportation Needs (07/09/2023)   Received from [facility] - Transportation    Lack of Transportation (Medical): No    Lack of Transportation (Non-Medical): No  Physical Activity: Unknown (07/09/2023)   Received from [facility]   Exercise Vital Sign    Days of Exercise per Week: 0 days    Minutes of Exercise per Session: Not on file  Stress: No Stress Concern Present (07/09/2023)   Received from [facility] - Occupational Stress Questionnaire    Feeling of Stress : Only a little  Social Connections: Socially Integrated (07/09/2023)   Received from [facility]   Social Network    How would you rate your social network (family, work, friends)?: Good participation with social networks  Intimate Partner Violence: Not At Risk (07/09/2023)   Received from [facility]   HITS    Over the last 12 months how often did your partner physically hurt you?: Never    Over the last 12 months how often did your partner insult you or talk down to you?: Never    Over the last 12 months how often did your partner threaten you with physical harm?: Never    Over the last 12 months how often did your partner scream or curse at you?: Never     Allergies  Allergen Reactions   Selenium Dioxide [Selenium] Anaphylaxis   Gluten Meal Other (See Comments)    Stomach upset Stomach upset Stomach upset   Milk (Cow) Other (See Comments)    Stomach upset   Nsaids Other (See Comments)    Other reaction(s): Abdominal Pain Ulcerative colitis Other reaction(s): Abdominal Pain Ulcerative colitis Ulcerative colitis Other reaction(s): Abdominal Pain Ulcerative colitis   Phentermine Other (See Comments)    Pt stated, "Made my IC flare up" Pt stated, "Made my IC flare up" Pt stated, "Made my IC flare up"   Amitriptyline Hcl Other (See Comments)   Bacid Other (See Comments)   Meloxicam Nausea And Vomiting    Milk-Related Compounds Other (See Comments)    Stomach upset     Outpatient Medications Prior to Visit  Medication Sig Dispense Refill   Armodafinil  150 MG tablet TAKE 1 TABLET BY MOUTH EVERY DAY (Patient taking differently: Take 150 mg by mouth daily. As needed) 30 tablet 1   aspirin EC 81 MG  tablet Take 81 mg by mouth daily. Swallow whole.     chlorproMAZINE (THORAZINE) 25 MG tablet PRN     clonazePAM  (KLONOPIN ) 1 MG tablet Take 1 tablet (1 mg total) by mouth at bedtime. 90 tablet 0   cyanocobalamin  (VITAMIN B12) 1000 MCG tablet Take 1,000 mcg by mouth daily.     Dextromethorphan-buPROPion  ER (AUVELITY ) 45-105 MG TBCR Take 1 tablet by mouth 2 (two) times daily. 60 tablet 2   famotidine  (PEPCID ) 20 MG tablet Take 20 mg by mouth daily as needed for heartburn or indigestion. Pt taking in AM and PM     fluticasone  (FLONASE ) 50 MCG/ACT nasal spray Place 2 sprays into both nostrils daily. 11.1 mL 2   ketorolac  (TORADOL ) 60 MG/2ML SOLN injection Inject 60 mg into the muscle as needed (migraines).      Lasmiditan Succinate (REYVOW) 100 MG TABS Take 100 mg by mouth as needed.     metoprolol  succinate (TOPROL  XL) 25 MG 24 hr tablet Take 1 tablet (25 mg total) by mouth daily. 90 tablet 3   ondansetron  (ZOFRAN ) 4 MG tablet TAKE 1 TABLET BY MOUTH EVERY 8 HOURS AS NEEDED FOR NAUSEA AND VOMITING 20 tablet 0   pantoprazole  (PROTONIX ) 40 MG tablet Take 1 tablet (40 mg total) by mouth 2 (two) times daily before a meal. 180 tablet 3   rizatriptan (MAXALT) 10 MG tablet Take 10 mg by mouth as needed for migraine.     sertraline  (ZOLOFT ) 25 MG tablet Take 1.5 tablets (37.5 mg total) by mouth daily. 135 tablet 0   simethicone  (MYLICON) 125 MG chewable tablet Chew 125 mg by mouth every 6 (six) hours as needed for flatulence.     Sodium Sulfate-Mag Sulfate-KCl (SUTAB ) 1479-225-188 MG TABS Use as directed for colonoscopy. MANUFACTURER CODES!! BIN: [id number] PCN: CN GROUP: [id number] MEMBER ID: [id number];GNF AS SECONDARY  INSURANCE ;NO PRIOR AUTHORIZATION 24 tablet 0   tiZANidine  (ZANAFLEX ) 4 MG tablet Take 1 tablet (4 mg total) by mouth at bedtime. 30 tablet 1   topiramate  (TOPAMAX ) 50 MG tablet Take 1 tablet (50 mg total) by mouth 2 (two) times daily. (Patient taking differently: Take 75 mg by mouth every evening.) 60 tablet 0   traZODone  (DESYREL ) 100 MG tablet Take 1 tablet (100 mg total) by mouth at bedtime. 90 tablet 0   VITAMIN D  PO Take 4,000 Units by mouth daily.     Wheat Dextrin (BENEFIBER PO) Take by mouth.     rosuvastatin  (CRESTOR ) 10 MG tablet Take 1 tablet (10 mg total) by mouth daily. 90 tablet 3   No facility-administered medications prior to visit.         Objective:   Physical Exam Vitals:   09/26/23 1311  BP: 110/76  Pulse: (!) 58  SpO2: 96%  Weight: 205 lb 6.4 oz (93.2 kg)  Height: 5\' 5"  (1.651 m)       Gen: Pleasant, well-nourished, in no distress,  normal affect  ENT: No lesions,  mouth clear,  oropharynx clear, no postnasal drip  Neck: No JVD, no stridor  Lungs: No use of accessory muscles, no crackles or wheezing within normal breath.  She does have some end expiratory wheeze and develops cough with a forced expiration  Cardiovascular: RRR, heart sounds normal, no murmur or gallops, no peripheral edema  Musculoskeletal: No deformities, no cyanosis or clubbing  Neuro: alert, awake, non focal  Skin: Warm, no lesions or rash      Assessment & Plan:  Dyspnea Based on  her reassuring evaluation I think this is principally deconditioning, also possibly some increased work of breathing because she has difficulty moving air through her nose and sinuses.  I think she needs to slowly work on her exercise and conditioning.  With reassuring cardiac and pulmonary evaluation is not clear to me that a cardiopulmonary exercise test would add very much but we could consider doing it for completeness.  Your pulmonary function testing is normal.  This is good news.  There is no  evidence for asthma or restriction on the size of each breath you take. I believe that your shortness of breath is least in part due to some deconditioning since you have had reassuring cardiac and pulmonary evaluations.  We could consider ordering a cardiopulmonary exercise test at some point in the future if we believe this would be helpful.  Recommend discussing this with cardiology also. Follow in our office for any changes in your breathing, new respiratory symptoms. Follow with cardiology as planned  Chronic sinusitis Continue your loratadine and fluticasone  nasal spray as you have been taking them.  Also continue nasal saline rinses.  There may be some benefit to going back to see ENT if you continue to have symptoms although I know you have had a reassuring exam there before.   Time spent 41 minutes  Racheal Buddle, MD, PhD 09/26/2023, 1:33 PM [facility] [phone] or if no answer before 7:00PM call [phone] For any issues after 7:00PM please call eLink [phone]

## 2023-09-26 NOTE — Assessment & Plan Note (Signed)
 Based on her reassuring evaluation I think this is principally deconditioning, also possibly some increased work of breathing because she has difficulty moving air through her nose and sinuses.  I think she needs to slowly work on her exercise and conditioning.  With reassuring cardiac and pulmonary evaluation is not clear to me that a cardiopulmonary exercise test would add very much but we could consider doing it for completeness.  Your pulmonary function testing is normal.  This is good news.  There is no evidence for asthma or restriction on the size of each breath you take. I believe that your shortness of breath is least in part due to some deconditioning since you have had reassuring cardiac and pulmonary evaluations.  We could consider ordering a cardiopulmonary exercise test at some point in the future if we believe this would be helpful.  Recommend discussing this with cardiology also. Follow in our office for any changes in your breathing, new respiratory symptoms. Follow with cardiology as planned

## 2023-09-30 ENCOUNTER — Other Ambulatory Visit: Payer: Self-pay

## 2023-09-30 DIAGNOSIS — F331 Major depressive disorder, recurrent, moderate: Secondary | ICD-10-CM

## 2023-09-30 MED ORDER — AUVELITY 45-105 MG PO TBCR: 1.0000 | EXTENDED_RELEASE_TABLET | Freq: Two times a day (BID) | ORAL | 1 refills | Status: DC

## 2023-10-01 ENCOUNTER — Ambulatory Visit: Payer: BC Managed Care – PPO | Attending: Internal Medicine | Admitting: Internal Medicine

## 2023-10-01 ENCOUNTER — Encounter: Payer: Self-pay | Admitting: Internal Medicine

## 2023-10-01 VITALS — BP 118/60 | HR 58 | Ht 65.0 in | Wt 203.0 lb

## 2023-10-01 DIAGNOSIS — R0609 Other forms of dyspnea: Secondary | ICD-10-CM

## 2023-10-01 DIAGNOSIS — R002 Palpitations: Secondary | ICD-10-CM | POA: Diagnosis not present

## 2023-10-01 NOTE — Progress Notes (Signed)
 HPI Marisa Hamilton returns for ongoing evaluation of palpitations and PVC's. She is a pleasant 63 yo woman with a h/o chest pain and palpitations who was found to have a 17% burden of PVC's as well as non-sustained and up to 40 seconds of SVT. Her PVC morphology is positive in AVF, and in the entire precordium and neg in R and L. She is frustrated about her inability to lose weight. She also has been found to have a soft coronary plaque in her LAD. 50%. She denies anginal symptoms. She was placed on a beta blocker and ASA. She was placed on a statin. She does have palpitations. Her main complaint today is profound fatigue and she wonders if any of her psych meds could be playing a role in this.  Allergies  Allergen Reactions   Selenium Dioxide [Selenium] Anaphylaxis   Gluten Meal Other (See Comments)    Stomach upset Stomach upset Stomach upset   Milk (Cow) Other (See Comments)    Stomach upset   Nsaids Other (See Comments)    Other reaction(s): Abdominal Pain Ulcerative colitis Other reaction(s): Abdominal Pain Ulcerative colitis Ulcerative colitis Other reaction(s): Abdominal Pain Ulcerative colitis   Phentermine Other (See Comments)    Pt stated, "Made my IC flare up" Pt stated, "Made my IC flare up" Pt stated, "Made my IC flare up"   Amitriptyline Hcl Other (See Comments)   Bacid Other (See Comments)   Meloxicam Nausea And Vomiting   Milk-Related Compounds Other (See Comments)    Stomach upset     Current Outpatient Medications  Medication Sig Dispense Refill   Armodafinil  150 MG tablet TAKE 1 TABLET BY MOUTH EVERY DAY (Patient taking differently: Take 150 mg by mouth daily. As needed) 30 tablet 1   aspirin EC 81 MG tablet Take 81 mg by mouth daily. Swallow whole.     chlorproMAZINE (THORAZINE) 25 MG tablet PRN     clonazePAM  (KLONOPIN ) 1 MG tablet Take 1 tablet (1 mg total) by mouth at bedtime. 90 tablet 0   cyanocobalamin  (VITAMIN B12) 1000 MCG tablet Take 1,000 mcg by  mouth daily.     Dextromethorphan-buPROPion  ER (AUVELITY ) 45-105 MG TBCR Take 1 tablet by mouth 2 (two) times daily. 60 tablet 1   famotidine  (PEPCID ) 20 MG tablet Take 20 mg by mouth daily as needed for heartburn or indigestion. Pt taking in AM and PM     fluticasone  (FLONASE ) 50 MCG/ACT nasal spray Place 2 sprays into both nostrils daily. 11.1 mL 2   ketorolac  (TORADOL ) 60 MG/2ML SOLN injection Inject 60 mg into the muscle as needed (migraines).      Lasmiditan Succinate (REYVOW) 100 MG TABS Take 100 mg by mouth as needed.     metoprolol  succinate (TOPROL  XL) 25 MG 24 hr tablet Take 1 tablet (25 mg total) by mouth daily. 90 tablet 3   ondansetron  (ZOFRAN ) 4 MG tablet TAKE 1 TABLET BY MOUTH EVERY 8 HOURS AS NEEDED FOR NAUSEA AND VOMITING 20 tablet 0   pantoprazole  (PROTONIX ) 40 MG tablet Take 1 tablet (40 mg total) by mouth 2 (two) times daily before a meal. 180 tablet 3   rizatriptan (MAXALT) 10 MG tablet Take 10 mg by mouth as needed for migraine.     sertraline  (ZOLOFT ) 25 MG tablet Take 1.5 tablets (37.5 mg total) by mouth daily. 135 tablet 0   simethicone  (MYLICON) 125 MG chewable tablet Chew 125 mg by mouth every 6 (six) hours as needed  for flatulence.     Sodium Sulfate-Mag Sulfate-KCl (SUTAB ) 1479-225-188 MG TABS Use as directed for colonoscopy. MANUFACTURER CODES!! BIN: [id number] PCN: CN GROUP: [id number] MEMBER ID: [id number];XBM AS SECONDARY INSURANCE ;NO PRIOR AUTHORIZATION 24 tablet 0   tiZANidine  (ZANAFLEX ) 4 MG tablet Take 1 tablet (4 mg total) by mouth at bedtime. 30 tablet 1   topiramate  (TOPAMAX ) 50 MG tablet Take 1 tablet (50 mg total) by mouth 2 (two) times daily. (Patient taking differently: Take 75 mg by mouth every evening.) 60 tablet 0   traZODone  (DESYREL ) 100 MG tablet Take 1 tablet (100 mg total) by mouth at bedtime. 90 tablet 0   VITAMIN D  PO Take 4,000 Units by mouth daily.     Wheat Dextrin (BENEFIBER PO) Take by mouth.     rosuvastatin  (CRESTOR ) 10 MG tablet Take 1  tablet (10 mg total) by mouth daily. 90 tablet 3   No current facility-administered medications for this visit.     Past Medical History:  Diagnosis Date   Anxiety    Back pain    CAD (coronary artery disease), native coronary artery    50% prox LAD and <25% prox RCA>>noncalcified plaque by CTA 05/2023   CFS (chronic fatigue syndrome)    Concussion 03/17/2017   Constipation    Cystitis, interstitial    HAD BLADDER SX FOR SAME   Depression    Fainting spell    Food allergy     Foot pain    GERD (gastroesophageal reflux disease)    Headache(784.0)    IBS (irritable bowel syndrome)    TAKES PROBIOTICS   Insomnia    Lactose intolerance    Leg pain    Migraine    MVP (mitral valve prolapse)    Mild posterior mitral valve prolapse with mild MR by echo 05/2023   Nausea    Neck pain    PAT (paroxysmal atrial tachycardia) (HCC)    Noted on heart monitor 04/2023   PONV (postoperative nausea and vomiting)    PVC's (premature ventricular contractions)    PVC load 17.6% by heart monitor 04/2023   Recurrent upper respiratory infection (URI)    Restless leg syndrome    Stomach ache    Ulcerative colitis (HCC)    Varicose veins     ROS:   All systems reviewed and negative except as noted in the HPI.   Past Surgical History:  Procedure Laterality Date   ABDOMINOPLASTY  08/2012   BLADDER SURGERY     FOR IC   BREAST BIOPSY Left 02/26/2012   negative   BREAST REDUCTION SURGERY  10/10/2011   Procedure: MAMMARY REDUCTION  (BREAST);  Surgeon: Doylene Genet, MD;  Location: [facility];  Service: Plastics;  Laterality: Bilateral;   COLONOSCOPY  10/22/2015   Dr.Mann   ESOPHAGOGASTRODUODENOSCOPY ENDOSCOPY     LASIK     NASAL SEPTUM SURGERY     REDUCTION MAMMAPLASTY Bilateral    VASCULAR SURGERY     VARICOSE VEINS   WRIST ARTHROSCOPY WITH CARPOMETACARPEL [facility]) ARTHROPLASTY Left 10/20/2022     Family History  Problem Relation Age of Onset   Osteoporosis  Mother    Atrial fibrillation Mother    Heart disease Mother    Stroke Mother    Depression Mother    Anxiety disorder Mother    Obesity Mother    Angioedema Father    Diabetes Father    Hypertension Father    Depression Father    Obesity Father  Migraines Brother        CHRONIC   Breast cancer Maternal Grandmother    Cancer Maternal Grandmother        ovarian   Diabetes Paternal Grandmother    Colon cancer Neg Hx    Esophageal cancer Neg Hx    Pancreatic cancer Neg Hx    Stomach cancer Neg Hx    Liver disease Neg Hx    Rectal cancer Neg Hx    Colon polyps Neg Hx      Social History   Socioeconomic History   Marital status: Married    Spouse name: Retail buyer   Number of children: 2   Years of education: Not on file   Highest education level: Not on file  Occupational History   Occupation: Retired Magazine features editor  Tobacco Use   Smoking status: Never   Smokeless tobacco: Never  Vaping Use   Vaping status: Never Used  Substance and Sexual Activity   Alcohol use: Not Currently   Drug use: No   Sexual activity: Yes    Partners: Male    Birth control/protection: Other-see comments, Post-menopausal    Comment: husband vasectomy  Other Topics Concern   Not on file  Social History Narrative   Not on file   Social Drivers of Health   Financial Resource Strain: Low Risk  (07/09/2023)   Received [facility]   Overall Financial Resource Strain (CARDIA)    Difficulty of Paying Living Expenses: Not hard at all  Food Insecurity: No Food Insecurity (07/09/2023)   Received from [facility]   Hunger Vital Sign    Worried About Running Out of Food in the Last Year: Never true    Ran Out of Food in the Last Year: Never true  Transportation Needs: No Transportation Needs (07/09/2023)   Received from [facility] - Transportation    Lack of Transportation (Medical): No    Lack of Transportation (Non-Medical): No  Physical Activity: Unknown  (07/09/2023)   Received from [facility]   Exercise Vital Sign    Days of Exercise per Week: 0 days    Minutes of Exercise per Session: Not on file  Stress: No Stress Concern Present (07/09/2023)   Received from [facility] - Occupational Stress Questionnaire    Feeling of Stress : Only a little  Social Connections: Socially Integrated (07/09/2023)   Received from [facility]   Social Network    How would you rate your social network (family, work, friends)?: Good participation with social networks  Intimate Partner Violence: Not At Risk (07/09/2023)   Received from [facility]   HITS    Over the last 12 months how often did your partner physically hurt you?: Never    Over the last 12 months how often did your partner insult you or talk down to you?: Never    Over the last 12 months how often did your partner threaten you with physical harm?: Never    Over the last 12 months how often did your partner scream or curse at you?: Never     BP 118/60   Pulse (!) 58   Ht 5\' 5"  (1.651 m)   Wt 203 lb (92.1 kg)   LMP 07/29/2012 Comment: spotting that week  SpO2 96%   BMI 33.78 kg/m   Physical Exam:  Well appearing NAD HEENT: Unremarkable Neck:  No JVD, no thyromegally Lymphatics:  No adenopathy Back:  No CVA tenderness Lungs:  Clear with no wheezes HEART:  Regular rate rhythm, no murmurs, no rubs, no clicks Abd:  soft, positive bowel sounds, no organomegally, no rebound, no guarding Ext:  2 plus pulses, no edema, no cyanosis, no clubbing Skin:  No rashes no nodules Neuro:  CN II through XII intact, motor grossly intact  EKG - sinus brady with a single PVC   Assess/Plan:   PVC's - I have recommended she continue her beta blocker though with her bp low/normal we cannot increase. I do not recommend and AA drug at this time though flecainide would be a consideration, especially she can remain on a statin.  CAD - she has no calcium   but does have soft plaque. I have recommended she continue her statin. She is interested in working on her diet.  Obesity - she is encouraged to lose weight. I agree with a GLP-1 inhibitors. Severe weakness/sob - She has no exam findings to support this. A cardio-pulmonary stress test was discussed by Dr. Baldwin Levee and I think that this would be helpful.    Pete Brand Betzaira Mentel,MD

## 2023-10-01 NOTE — Patient Instructions (Signed)
 Medication Instructions:  Your physician recommends that you continue on your current medications as directed. Please refer to the Current Medication list given to you today.  *If you need a refill on your cardiac medications before your next appointment, please call your pharmacy*  Lab Work: None ordered.  You may go to any [facility] Location for your lab work:  [facility] - [address] ([facility]) - [address] [phone] [address]  [place] - [address]   [place]  - [address]   [place] - [address] - [address] [employer]  - [address] ([facility]   If you have labs (blood work) drawn today and your tests are completely normal, you will receive your results only by: [employer] (if you have MyChart)  If you have any lab test that is abnormal or we need to change your treatment, we will call you or send a MyChart message to review the results.  Testing/Procedures:     Follow-Up: At [facility], you and your health needs are our priority.  As part of our continuing mission to provide you with exceptional heart care, we have created designated Provider Care Teams.  These Care Teams include your primary Cardiologist (physician) and Advanced Practice Providers (APPs -  Physician Assistants and Nurse Practitioners) who all work together to provide you with the care you need, when you need it.  We recommend signing up for the patient portal called "MyChart".  Sign up information is provided on this After Visit Summary.  MyChart is used to connect with patients for Virtual Visits (Telemedicine).  Patients are able to view lab/test results, encounter notes, upcoming appointments, etc.  Non-urgent messages can be sent to your provider as well.   To learn more about what you can do with MyChart, go to [email or web address].     Your next appointment:   To be determined based on testing  The format for your next appointment:   In Person  Provider:   Manya Sells, MD{or one of the following Advanced Practice Providers on your designated Care Team:   Mertha Abrahams, [place] [address] [place], [place] Neda Balk, NP

## 2023-10-04 ENCOUNTER — Telehealth: Payer: Self-pay | Admitting: Psychiatry

## 2023-10-04 NOTE — Telephone Encounter (Signed)
Recommendations reviewed with patient.

## 2023-10-04 NOTE — Telephone Encounter (Signed)
 Pt reporting she is taking Auvelity  once a day for about the last month. She is c/o trouble breathing, headaches, dizziness, and gaining weight. I asked her if she had seen her PCP or a cardiologist or pulmonologist. She reports she has seen all the specialists and they can't find any reason for sx. She is currently recovering from bronchitis. She is to have a cardiopulmonary test soon. Has had frequent visits with specialists the past month. Reports BP WNL; on 5/19 was 118/60, pulse 48, 02 96%, weight 203.

## 2023-10-04 NOTE — Telephone Encounter (Signed)
 Pt called and said that she wants to wean of of auvelity . She was taking 2 pills but is down to 1 pill a day. She went down on 1 pill due to side effects. She would like to know how too stop auvelity . Please call Mata at [phone]

## 2023-10-04 NOTE — Telephone Encounter (Signed)
 Just stop it .  No WD sx.  It will be out of her system in 2-3 days completely so any related SE will be gone by then

## 2023-10-19 ENCOUNTER — Other Ambulatory Visit: Payer: Self-pay | Admitting: Obstetrics and Gynecology

## 2023-10-19 ENCOUNTER — Other Ambulatory Visit: Payer: Self-pay | Admitting: Gastroenterology

## 2023-10-19 ENCOUNTER — Telehealth: Payer: Self-pay | Admitting: Gastroenterology

## 2023-10-19 DIAGNOSIS — N6489 Other specified disorders of breast: Secondary | ICD-10-CM

## 2023-10-19 MED ORDER — OMEPRAZOLE 40 MG PO CPDR: 40.0000 mg | DELAYED_RELEASE_CAPSULE | Freq: Every day | ORAL | 3 refills | Status: AC

## 2023-10-19 NOTE — Telephone Encounter (Signed)
 Called and spoke with patient and she wanted omeprazole  40 mg daily sent in to her pharmacy.  I cancelled the protonix  prescription.

## 2023-10-19 NOTE — Telephone Encounter (Signed)
 Inbound call from patient, states she would like to cancel her pantoprazole  her prescription and would like to start esomprazole again. States with the pantoprazole  she is still experiencing burning.

## 2023-10-22 ENCOUNTER — Emergency Department (HOSPITAL_BASED_OUTPATIENT_CLINIC_OR_DEPARTMENT_OTHER)
Admission: EM | Admit: 2023-10-22 | Discharge: 2023-10-22 | Disposition: A | Discharge status: Home / Self Care | Attending: Emergency Medicine | Admitting: Emergency Medicine

## 2023-10-22 ENCOUNTER — Emergency Department (HOSPITAL_BASED_OUTPATIENT_CLINIC_OR_DEPARTMENT_OTHER)

## 2023-10-22 ENCOUNTER — Other Ambulatory Visit: Payer: Self-pay

## 2023-10-22 ENCOUNTER — Encounter (HOSPITAL_BASED_OUTPATIENT_CLINIC_OR_DEPARTMENT_OTHER): Payer: Self-pay | Admitting: *Deleted

## 2023-10-22 DIAGNOSIS — N201 Calculus of ureter: Secondary | ICD-10-CM | POA: Diagnosis not present

## 2023-10-22 DIAGNOSIS — R109 Unspecified abdominal pain: Secondary | ICD-10-CM | POA: Diagnosis not present

## 2023-10-22 DIAGNOSIS — Z7982 Long term (current) use of aspirin: Secondary | ICD-10-CM | POA: Diagnosis not present

## 2023-10-22 DIAGNOSIS — Z79899 Other long term (current) drug therapy: Secondary | ICD-10-CM | POA: Diagnosis not present

## 2023-10-22 DIAGNOSIS — K5730 Diverticulosis of large intestine without perforation or abscess without bleeding: Secondary | ICD-10-CM | POA: Diagnosis not present

## 2023-10-22 DIAGNOSIS — I2510 Atherosclerotic heart disease of native coronary artery without angina pectoris: Secondary | ICD-10-CM | POA: Diagnosis not present

## 2023-10-22 DIAGNOSIS — N132 Hydronephrosis with renal and ureteral calculous obstruction: Secondary | ICD-10-CM | POA: Diagnosis not present

## 2023-10-22 DIAGNOSIS — I10 Essential (primary) hypertension: Secondary | ICD-10-CM | POA: Diagnosis not present

## 2023-10-22 DIAGNOSIS — R932 Abnormal findings on diagnostic imaging of liver and biliary tract: Secondary | ICD-10-CM | POA: Diagnosis not present

## 2023-10-22 LAB — URINALYSIS, W/ REFLEX TO CULTURE (INFECTION SUSPECTED)
Bilirubin Urine: NEGATIVE
Glucose, UA: NEGATIVE mg/dL
Ketones, ur: NEGATIVE mg/dL
Leukocytes,Ua: NEGATIVE
Nitrite: NEGATIVE
Protein, ur: NEGATIVE mg/dL
Specific Gravity, Urine: 1.005 (ref 1.005–1.030)
pH: 6 (ref 5.0–8.0)

## 2023-10-22 LAB — COMPREHENSIVE METABOLIC PANEL WITH GFR
ALT: 16 U/L (ref 0–44)
AST: 18 U/L (ref 15–41)
Albumin: 4 g/dL (ref 3.5–5.0)
Alkaline Phosphatase: 75 U/L (ref 38–126)
Anion gap: 12 (ref 5–15)
BUN: 21 mg/dL (ref 8–23)
CO2: 24 mmol/L (ref 22–32)
Calcium: 9.1 mg/dL (ref 8.9–10.3)
Chloride: 106 mmol/L (ref 98–111)
Creatinine, Ser: 0.94 mg/dL (ref 0.44–1.00)
GFR, Estimated: >60 mL/min (ref >60)
Glucose, Bld: 118 mg/dL — ABNORMAL HIGH (ref 70–99)
Potassium: 4.1 mmol/L (ref 3.5–5.1)
Sodium: 141 mmol/L (ref 135–145)
Total Bilirubin: <0.2 mg/dL (ref 0.0–1.2)
Total Protein: 6.9 g/dL (ref 6.5–8.1)

## 2023-10-22 LAB — URINALYSIS, ROUTINE W REFLEX MICROSCOPIC

## 2023-10-22 LAB — CBC
HCT: 41 % (ref 36.0–46.0)
Hemoglobin: 13.3 g/dL (ref 12.0–15.0)
MCH: 28.5 pg (ref 26.0–34.0)
MCHC: 32.4 g/dL (ref 30.0–36.0)
MCV: 88 fL (ref 80.0–100.0)
Platelets: 227 10*3/uL (ref 150–400)
RBC: 4.66 MIL/uL (ref 3.87–5.11)
RDW: 14.4 % (ref 11.5–15.5)
WBC: 9.3 10*3/uL (ref 4.0–10.5)
nRBC: 0 % (ref 0.0–0.2)

## 2023-10-22 LAB — LIPASE, BLOOD: Lipase: 51 U/L (ref 11–51)

## 2023-10-22 MED ORDER — ONDANSETRON 4 MG PO TBDP: 4.0000 mg | ORAL_TABLET | Freq: Three times a day (TID) | ORAL | 0 refills | Status: AC | PRN

## 2023-10-22 MED ORDER — TAMSULOSIN HCL 0.4 MG PO CAPS
0.4000 mg | ORAL_CAPSULE | Freq: Once | ORAL | Status: AC
  Administered 2023-10-22: 0.4 mg via ORAL
  Filled 2023-10-22: qty 1

## 2023-10-22 MED ORDER — TAMSULOSIN HCL 0.4 MG PO CAPS: 0.4000 mg | ORAL_CAPSULE | Freq: Every day | ORAL | 0 refills | Status: AC

## 2023-10-22 MED ORDER — KETOROLAC TROMETHAMINE 15 MG/ML IJ SOLN
15.0000 mg | Freq: Once | INTRAMUSCULAR | Status: AC
  Administered 2023-10-22: 15 mg via INTRAVENOUS
  Filled 2023-10-22: qty 1

## 2023-10-22 MED ORDER — SODIUM CHLORIDE 0.9 % IV BOLUS
1000.0000 mL | Freq: Once | INTRAVENOUS | Status: AC
  Administered 2023-10-22: 1000 mL via INTRAVENOUS

## 2023-10-22 MED ORDER — ONDANSETRON HCL 4 MG/2ML IJ SOLN
4.0000 mg | Freq: Once | INTRAMUSCULAR | Status: AC
  Administered 2023-10-22: 4 mg via INTRAVENOUS
  Filled 2023-10-22: qty 2

## 2023-10-22 MED ORDER — OXYCODONE HCL 5 MG PO TABS: 2.5000 mg | ORAL_TABLET | Freq: Four times a day (QID) | ORAL | 0 refills | Status: AC | PRN

## 2023-10-22 MED ORDER — KETOROLAC TROMETHAMINE 15 MG/ML IJ SOLN
7.5000 mg | Freq: Once | INTRAMUSCULAR | Status: AC
  Administered 2023-10-22: 7.5 mg via INTRAVENOUS
  Filled 2023-10-22: qty 1

## 2023-10-22 NOTE — ED Provider Notes (Signed)
 [place] EMERGENCY DEPARTMENT AT [facility] Provider Note  CSN: [id number] Arrival date & time: 10/22/23 0330  Chief Complaint(s) Flank Pain  HPI Marisa Hamilton is a 63 y.o. female with a past medical history listed below who presents to the emergency department with left-sided flank pain that began just prior to arrival.  Constant but fluctuating and gradually worsening in nature.  Patient reported having urgency and frequency several hours prior to symptom onset.  She is endorsing nausea without emesis.  Reports taking Pyridium for possible urinary tract infection.  The history is provided by the patient.    Past Medical History Past Medical History:  Diagnosis Date   Anxiety    Back pain    CAD (coronary artery disease), native coronary artery    50% prox LAD and <25% prox RCA>>noncalcified plaque by CTA 05/2023   CFS (chronic fatigue syndrome)    Concussion 03/17/2017   Constipation    Cystitis, interstitial    HAD BLADDER SX FOR SAME   Depression    Fainting spell    Food allergy     Foot pain    GERD (gastroesophageal reflux disease)    Headache(784.0)    IBS (irritable bowel syndrome)    TAKES PROBIOTICS   Insomnia    Lactose intolerance    Leg pain    Migraine    MVP (mitral valve prolapse)    Mild posterior mitral valve prolapse with mild MR by echo 05/2023   Nausea    Neck pain    PAT (paroxysmal atrial tachycardia) (HCC)    Noted on heart monitor 04/2023   PONV (postoperative nausea and vomiting)    PVC's (premature ventricular contractions)    PVC load 17.6% by heart monitor 04/2023   Recurrent upper respiratory infection (URI)    Restless leg syndrome    Stomach ache    Ulcerative colitis (HCC)    Varicose veins    Patient Active Problem List   Diagnosis Date Noted   Chronic sinusitis 09/26/2023   Acute bronchitis 06/27/2023   PVC's (premature ventricular contractions)    PAT (paroxysmal atrial tachycardia) (HCC)    Pulmonary  nodules 06/22/2021   Multiple thyroid  nodules 06/22/2021   Dyspnea 06/22/2021   Chronic fatigue syndrome 08/05/2020   Essential hypertension 08/05/2020   Heart disease 08/05/2020   Mild major depression, single episode (HCC) 08/05/2020   Schatzki's ring 08/05/2020   Mitral valve prolapse 08/05/2020   Class 1 obesity with serious comorbidity and body mass index (BMI) of 31.0 to 31.9 in adult 12/29/2019   Hepatic cyst, left lobe, stable, US  2018 12/12/2019   Emotional eating behaviors, followed by Dr. Delaine Favorite (Bariatric Psychology) 12/03/2019   Other chronic pain 12/03/2019   Polypharmacy 12/03/2019   Prediabetes, highest A1c 5.7 on1/23/21, improved with diet changes 12/03/2019   Vitamin D  deficiency, AT GOAL, taking OTC vitamin D , 10/06/19 = 65.9 12/03/2019   Moderate episode of recurrent major depressive disorder (HCC) 12/03/2019   Attention deficit hyperactivity disorder (ADHD), predominantly inattentive type 12/03/2019   Insomnia due to other mental disorder, followed by Psych, Rx Trazodone  q hs 12/03/2019   Hypertriglyceridemia 12/03/2019   Fibromyalgia 12/03/2019   Chronic constipation, treatment includes: fiber gummies, colace, miralax, Symproic 12/03/2019   IC (interstitial cystitis) 12/03/2019   Grade I diastolic dysfunction 12/03/2019   Chronic migraine 12/03/2019   Diverticulosis of colon 12/03/2019   GERD (gastroesophageal reflux disease) 12/03/2019   Tubular adenoma of colon, colonoscopy 2017, with repeat due 2022, Dr. Tova Fresh  12/03/2019   Ulcerative colitis (HCC) 12/03/2019   Low back pain 10/07/2019   Pain in joint, ankle and foot 10/07/2019   GAD (generalized anxiety disorder), Rx Zoloft  25 mg q hs, followed by Psych 03/02/2018   PTSD (post-traumatic stress disorder), followed by Psych 03/02/2018   Chronic venous insufficiency 10/28/2013   Varicose veins of bilateral lower extremities with other complications 10/28/2013   PLMD (periodic limb movement disorder), NPSG 2011  12/11/2009   Other extrapyramidal disease and abnormal movement disorder 12/11/2009   Allergic rhinitis 10/05/2009   Home Medication(s) Prior to Admission medications   Medication Sig Start Date End Date Taking? Authorizing Provider  ondansetron  (ZOFRAN -ODT) 4 MG disintegrating tablet Take 1 tablet (4 mg total) by mouth every 8 (eight) hours as needed for up to 3 days for nausea or vomiting. 10/22/23 10/25/23 Yes Belton Peplinski, Camila Cecil, MD  oxyCODONE  (ROXICODONE ) 5 MG immediate release tablet Take 0.5-1 tablets (2.5-5 mg total) by mouth every 6 (six) hours as needed for up to 5 days for severe pain (pain score 7-10). 10/22/23 10/27/23 Yes Beck Cofer, Camila Cecil, MD  tamsulosin (FLOMAX) 0.4 MG CAPS capsule Take 1 capsule (0.4 mg total) by mouth daily for 5 days. 10/22/23 10/27/23 Yes Wojciech Willetts, Camila Cecil, MD  Armodafinil  150 MG tablet TAKE 1 TABLET BY MOUTH EVERY DAY Patient taking differently: Take 150 mg by mouth daily. As needed 09/14/23   Cottle, Kennedy Peabody., MD  aspirin EC 81 MG tablet Take 81 mg by mouth daily. Swallow whole.    [provider]  chlorproMAZINE (THORAZINE) 25 MG tablet PRN 07/19/20   [provider]  clonazePAM  (KLONOPIN ) 1 MG tablet Take 1 tablet (1 mg total) by mouth at bedtime. 08/13/23   Cottle, Kennedy Peabody., MD  cyanocobalamin  (VITAMIN B12) 1000 MCG tablet Take 1,000 mcg by mouth daily.    [provider]  Dextromethorphan-buPROPion  ER (AUVELITY ) 45-105 MG TBCR Take 1 tablet by mouth 2 (two) times daily. 09/30/23   Marvia Slocumb T, PA-C  famotidine  (PEPCID ) 20 MG tablet Take 20 mg by mouth daily as needed for heartburn or indigestion. Pt taking in AM and PM    [provider]  fluticasone  (FLONASE ) 50 MCG/ACT nasal spray Place 2 sprays into both nostrils daily. 09/05/23   Byrum, Robert S, MD  ketorolac  (TORADOL ) 60 MG/2ML SOLN injection Inject 60 mg into the muscle as needed (migraines).  11/06/19   [provider]  Lasmiditan Succinate  (REYVOW) 100 MG TABS Take 100 mg by mouth as needed.    [provider]  metoprolol  succinate (TOPROL  XL) 25 MG 24 hr tablet Take 1 tablet (25 mg total) by mouth daily. 05/04/23   Jacqueline Matsu, MD  omeprazole  (PRILOSEC) 40 MG capsule Take 1 capsule (40 mg total) by mouth daily. 10/19/23   Nandigam, Kavitha V, MD  ondansetron  (ZOFRAN ) 4 MG tablet TAKE 1 TABLET BY MOUTH EVERY 8 HOURS AS NEEDED FOR NAUSEA AND VOMITING 07/21/21   Nandigam, Kavitha V, MD  rizatriptan (MAXALT) 10 MG tablet Take 10 mg by mouth as needed for migraine. 05/17/21   [provider]  rosuvastatin  (CRESTOR ) 10 MG tablet Take 1 tablet (10 mg total) by mouth daily. 06/18/23 09/16/23  Jacqueline Matsu, MD  sertraline  (ZOLOFT ) 25 MG tablet Take 1.5 tablets (37.5 mg total) by mouth daily. 08/13/23   Cottle, Kennedy Peabody., MD  simethicone  (MYLICON) 125 MG chewable tablet Chew 125 mg by mouth every 6 (six) hours as needed for flatulence.  [provider]  Sodium Sulfate-Mag Sulfate-KCl (SUTAB ) 1479-225-188 MG TABS Use as directed for colonoscopy. MANUFACTURER CODES!! BIN: [id number] PCN: CN GROUP: [id number] MEMBER ID: [id number];[employer] AS SECONDARY INSURANCE ;NO PRIOR AUTHORIZATION 07/11/23   Nandigam, Elida Grounds, MD  tiZANidine  (ZANAFLEX ) 4 MG tablet Take 1 tablet (4 mg total) by mouth at bedtime. 12/21/21   Cottle, Kennedy Peabody., MD  topiramate  (TOPAMAX ) 50 MG tablet Take 1 tablet (50 mg total) by mouth 2 (two) times daily. Patient taking differently: Take 75 mg by mouth every evening. 01/25/20   Jonn Nett, DO  traZODone  (DESYREL ) 100 MG tablet Take 1 tablet (100 mg total) by mouth at bedtime. 08/13/23   Cottle, Kennedy Peabody., MD  VITAMIN D  PO Take 4,000 Units by mouth daily.    [provider]  Wheat Dextrin (BENEFIBER PO) Take by mouth.    [provider]                                                                                                                                    Allergies Selenium  dioxide [selenium], Gluten meal, Milk (cow), Nsaids, Phentermine, Amitriptyline hcl, Bacid, Meloxicam, and Milk-related compounds  Review of Systems Review of Systems As noted in HPI  Physical Exam Vital Signs  I have reviewed the triage vital signs BP (!) 102/44 (BP Location: Right Arm)   Pulse (!) 56   Temp 98.4 F (36.9 C) (Oral)   Resp 16   Ht 5\' 5"  (1.651 m)   Wt 90.7 kg   LMP 07/29/2012 Comment: spotting that week  SpO2 98%   BMI 33.28 kg/m   Physical Exam Vitals reviewed.  Constitutional:      General: She is not in acute distress.    Appearance: She is well-developed. She is not diaphoretic.     Comments: In obvious discomfort  HENT:     Head: Normocephalic and atraumatic.     Right Ear: External ear normal.     Left Ear: External ear normal.     Nose: Nose normal.  Eyes:     General: No scleral icterus.    Conjunctiva/sclera: Conjunctivae normal.  Neck:     Trachea: Phonation normal.  Cardiovascular:     Rate and Rhythm: Normal rate and regular rhythm.  Pulmonary:     Effort: Pulmonary effort is normal. No respiratory distress.     Breath sounds: No stridor.  Abdominal:     General: There is no distension.     Tenderness: There is left CVA tenderness.  Musculoskeletal:        General: Normal range of motion.     Cervical back: Normal range of motion.  Neurological:     Mental Status: She is alert and oriented to person, place, and time.  Psychiatric:        Behavior: Behavior normal.     ED Results and Treatments Labs (all labs ordered are  listed, but only abnormal results are displayed) Labs Reviewed  COMPREHENSIVE METABOLIC PANEL WITH GFR - Abnormal; Notable for the following components:      Result Value   Glucose, Bld 118 (*)    All other components within normal limits  URINALYSIS, ROUTINE W REFLEX MICROSCOPIC - Abnormal; Notable for the following components:   Color, Urine ORANGE (*)    APPearance CLOUDY (*)    Glucose, UA   (*)     Value: TEST NOT REPORTED DUE TO COLOR INTERFERENCE OF URINE PIGMENT   Hgb urine dipstick   (*)    Value: TEST NOT REPORTED DUE TO COLOR INTERFERENCE OF URINE PIGMENT   Bilirubin Urine   (*)    Value: TEST NOT REPORTED DUE TO COLOR INTERFERENCE OF URINE PIGMENT   Ketones, ur   (*)    Value: TEST NOT REPORTED DUE TO COLOR INTERFERENCE OF URINE PIGMENT   Protein, ur   (*)    Value: TEST NOT REPORTED DUE TO COLOR INTERFERENCE OF URINE PIGMENT   Nitrite   (*)    Value: TEST NOT REPORTED DUE TO COLOR INTERFERENCE OF URINE PIGMENT   Leukocytes,Ua   (*)    Value: TEST NOT REPORTED DUE TO COLOR INTERFERENCE OF URINE PIGMENT   All other components within normal limits  CBC  LIPASE, BLOOD  URINALYSIS, W/ REFLEX TO CULTURE (INFECTION SUSPECTED)                                                                                                                         EKG  EKG Interpretation Date/Time:    Ventricular Rate:    PR Interval:    QRS Duration:    QT Interval:    QTC Calculation:   R Axis:      Text Interpretation:         Radiology CT Renal Stone Study Result Date: 10/22/2023 CLINICAL DATA:  Left flank pain.  Vomiting.  Urinary frequency. EXAM: CT ABDOMEN AND PELVIS WITHOUT CONTRAST TECHNIQUE: Multidetector CT imaging of the abdomen and pelvis was performed following the standard protocol without IV contrast. RADIATION DOSE REDUCTION: This exam was performed according to the departmental dose-optimization program which includes automated exposure control, adjustment of the mA and/or kV according to patient size and/or use of iterative reconstruction technique. COMPARISON:  12/30/2021 FINDINGS: Lower chest: . unremarkable. Hepatobiliary: Bilobed cyst versus adjacent cysts in the lateral segment left liver are stable in the interval. No followup imaging is recommended. There is no evidence for gallstones, gallbladder wall thickening, or pericholecystic fluid. No intrahepatic or extrahepatic  biliary dilation. Pancreas: No focal mass lesion. No dilatation of the main duct. No intraparenchymal cyst. No peripancreatic edema. Spleen: No splenomegaly. No suspicious focal mass lesion. Adrenals/Urinary Tract: No adrenal nodule or mass. Right kidney and ureter are unremarkable. Mild left hydroureteronephrosis evident with a 3 x 3 x 4 mm stone at the UVJ. No stones are seen in the left kidney or bladder. Stomach/Bowel: Stomach  is decompressed. Duodenum is normally positioned as is the ligament of Treitz. No small bowel wall thickening. No small bowel dilatation. The terminal ileum is normal. The appendix is normal. No gross colonic mass. No colonic wall thickening. Diverticular changes are noted in the left colon without evidence of diverticulitis. Vascular/Lymphatic: No abdominal aortic aneurysm. No abdominal aortic atherosclerotic calcification. There is no gastrohepatic or hepatoduodenal ligament lymphadenopathy. No retroperitoneal or mesenteric lymphadenopathy. No pelvic sidewall lymphadenopathy. Reproductive: The uterus is unremarkable.  There is no adnexal mass. Other: No intraperitoneal free fluid. Musculoskeletal: No worrisome lytic or sclerotic osseous abnormality. IMPRESSION: 1. 3 x 3 x 4 mm left UVJ stone with mild left hydroureteronephrosis. No other evidence of urinary stone disease. 2. Left colonic diverticulosis without diverticulitis. Electronically Signed   By: Donnal Fusi M.D.   On: 10/22/2023 05:36    Medications Ordered in ED Medications  ketorolac  (TORADOL ) 15 MG/ML injection 7.5 mg (has no administration in time range)  ondansetron  (ZOFRAN ) injection 4 mg (4 mg Intravenous Given 10/22/23 0400)  ketorolac  (TORADOL ) 15 MG/ML injection 15 mg (15 mg Intravenous Given 10/22/23 0407)  ketorolac  (TORADOL ) 15 MG/ML injection 7.5 mg (7.5 mg Intravenous Given 10/22/23 0532)  sodium chloride  0.9 % bolus 1,000 mL (0 mLs Intravenous Stopped 10/22/23 0636)  tamsulosin (FLOMAX) capsule 0.4 mg (0.4 mg  Oral Given 10/22/23 0615)  sodium chloride  0.9 % bolus 1,000 mL (1,000 mLs Intravenous New Bag/Given 10/22/23 0641)   Procedures Procedures  (including critical care time) Medical Decision Making / ED Course   Medical Decision Making Amount and/or Complexity of Data Reviewed Labs: ordered. Decision-making details documented in ED Course. Radiology: ordered and independent interpretation performed. Decision-making details documented in ED Course.  Risk Prescription drug management. Decision regarding hospitalization.    Left flank pain Differential diagnosis considered  Workup consistent with left ureteral stone with mild hydronephrosis.  UA with color interference from Pyridium.  Patient was given IV fluids.  Pain significantly improved with Toradol .  Patient will continue getting IV fluids until urine clears so we can evaluate for possible UTI.  Patient care turned over to oncoming provider. Patient case and results discussed in detail; please see their note for further ED managment.       Final Clinical Impression(s) / ED Diagnoses Final diagnoses:  Ureterolithiasis    This chart was dictated using voice recognition software.  Despite best efforts to proofread,  errors can occur which can change the documentation meaning.    Lindle Rhea, MD 10/22/23 [phone]

## 2023-10-22 NOTE — ED Notes (Signed)
 Pt states that she takes toradol  without any issues. Uses this med when she has a migraine.

## 2023-10-22 NOTE — ED Provider Notes (Signed)
  Physical Exam  BP (!) 102/44 (BP Location: Right Arm)   Pulse (!) 56   Temp 98.4 F (36.9 C) (Oral)   Resp 16   Ht 5\' 5"  (1.651 m)   Wt 90.7 kg   LMP 07/29/2012 Comment: spotting that week  SpO2 98%   BMI 33.28 kg/m   Physical Exam  Procedures  Procedures  ED Course / MDM    Medical Decision Making Amount and/or Complexity of Data Reviewed Labs: ordered. Radiology: ordered.  Risk Prescription drug management.   26F presenting with a kidney stone. Took Azo right before arrival messing with the UA. Will give fluids and reassess repeat UA.  CT Stone: IMPRESSION:  1. 3 x 3 x 4 mm left UVJ stone with mild left hydroureteronephrosis.  No other evidence of urinary stone disease.  2. Left colonic diverticulosis without diverticulitis.    Repeat UA without evidence of infection.  Patient advised Tylenol  as well as oxycodone  for pain control and outpatient follow-up with urology.  Flomax was also prescribed.  Patient stable for discharge.     Rosealee Concha, MD 10/22/23 [phone]

## 2023-10-22 NOTE — ED Triage Notes (Signed)
 C/o left flank pain that started at 2300. States pain is constant. States pain is both dull and sharp. C/o vomiting. Pt has had urinary frequency and took a pyridium. Denies history of a kidney stone.

## 2023-10-22 NOTE — Discharge Instructions (Addendum)
 For pain control you may take 1000 mg of Tylenol every 8 hours scheduled.  In addition you can take 0.5 to 1 tablet of Oxycodone every 6 hours as needed for pain not controlled with the scheduled Tylenol.

## 2023-10-24 ENCOUNTER — Ambulatory Visit: Admitting: Psychiatry

## 2023-11-01 DIAGNOSIS — R5383 Other fatigue: Secondary | ICD-10-CM | POA: Diagnosis not present

## 2023-11-01 DIAGNOSIS — J329 Chronic sinusitis, unspecified: Secondary | ICD-10-CM | POA: Diagnosis not present

## 2023-11-01 DIAGNOSIS — R059 Cough, unspecified: Secondary | ICD-10-CM | POA: Diagnosis not present

## 2023-11-01 DIAGNOSIS — R0981 Nasal congestion: Secondary | ICD-10-CM | POA: Diagnosis not present

## 2023-11-01 DIAGNOSIS — J411 Mucopurulent chronic bronchitis: Secondary | ICD-10-CM | POA: Diagnosis not present

## 2023-11-05 DIAGNOSIS — J40 Bronchitis, not specified as acute or chronic: Secondary | ICD-10-CM | POA: Diagnosis not present

## 2023-11-05 DIAGNOSIS — J329 Chronic sinusitis, unspecified: Secondary | ICD-10-CM | POA: Diagnosis not present

## 2023-11-08 ENCOUNTER — Other Ambulatory Visit: Payer: Self-pay | Admitting: Psychiatry

## 2023-11-08 DIAGNOSIS — F900 Attention-deficit hyperactivity disorder, predominantly inattentive type: Secondary | ICD-10-CM

## 2023-11-08 DIAGNOSIS — F331 Major depressive disorder, recurrent, moderate: Secondary | ICD-10-CM

## 2023-11-09 ENCOUNTER — Telehealth: Payer: Self-pay | Admitting: *Deleted

## 2023-11-09 NOTE — Telephone Encounter (Signed)
 Patient needs a direct colonoscopy scheduled

## 2023-11-13 DIAGNOSIS — G4700 Insomnia, unspecified: Secondary | ICD-10-CM | POA: Diagnosis not present

## 2023-11-13 DIAGNOSIS — J329 Chronic sinusitis, unspecified: Secondary | ICD-10-CM | POA: Diagnosis not present

## 2023-11-13 DIAGNOSIS — N200 Calculus of kidney: Secondary | ICD-10-CM | POA: Diagnosis not present

## 2023-11-13 DIAGNOSIS — R197 Diarrhea, unspecified: Secondary | ICD-10-CM | POA: Diagnosis not present

## 2023-11-13 DIAGNOSIS — R7303 Prediabetes: Secondary | ICD-10-CM | POA: Diagnosis not present

## 2023-11-13 DIAGNOSIS — R5382 Chronic fatigue, unspecified: Secondary | ICD-10-CM | POA: Diagnosis not present

## 2023-11-14 ENCOUNTER — Encounter (INDEPENDENT_AMBULATORY_CARE_PROVIDER_SITE_OTHER): Payer: Self-pay

## 2023-11-14 DIAGNOSIS — R197 Diarrhea, unspecified: Secondary | ICD-10-CM | POA: Diagnosis not present

## 2023-11-15 ENCOUNTER — Other Ambulatory Visit: Payer: Self-pay | Admitting: Psychiatry

## 2023-11-15 ENCOUNTER — Ambulatory Visit
Admission: RE | Admit: 2023-11-15 | Discharge: 2023-11-15 | Disposition: A | Discharge status: Home / Self Care | Source: Ambulatory Visit | Attending: Obstetrics and Gynecology | Admitting: Obstetrics and Gynecology

## 2023-11-15 DIAGNOSIS — F411 Generalized anxiety disorder: Secondary | ICD-10-CM

## 2023-11-15 DIAGNOSIS — R928 Other abnormal and inconclusive findings on diagnostic imaging of breast: Secondary | ICD-10-CM | POA: Diagnosis not present

## 2023-11-15 DIAGNOSIS — F5105 Insomnia due to other mental disorder: Secondary | ICD-10-CM

## 2023-11-15 DIAGNOSIS — F331 Major depressive disorder, recurrent, moderate: Secondary | ICD-10-CM

## 2023-11-15 DIAGNOSIS — N6489 Other specified disorders of breast: Secondary | ICD-10-CM

## 2023-11-15 DIAGNOSIS — R197 Diarrhea, unspecified: Secondary | ICD-10-CM | POA: Diagnosis not present

## 2023-11-19 ENCOUNTER — Ambulatory Visit: Payer: Self-pay | Admitting: Obstetrics and Gynecology

## 2023-11-22 NOTE — Telephone Encounter (Signed)
 Left detailed message for patient to contact the office to schedule her recall colonoscopy.   She can be scheduled directly with a [facility]

## 2023-12-04 ENCOUNTER — Other Ambulatory Visit: Payer: Self-pay | Admitting: Psychiatry

## 2023-12-04 DIAGNOSIS — F900 Attention-deficit hyperactivity disorder, predominantly inattentive type: Secondary | ICD-10-CM

## 2023-12-04 DIAGNOSIS — F331 Major depressive disorder, recurrent, moderate: Secondary | ICD-10-CM

## 2023-12-13 ENCOUNTER — Ambulatory Visit: Admitting: Psychiatry

## 2023-12-13 ENCOUNTER — Encounter: Payer: Self-pay | Admitting: Psychiatry

## 2023-12-13 DIAGNOSIS — F5105 Insomnia due to other mental disorder: Secondary | ICD-10-CM

## 2023-12-13 DIAGNOSIS — F515 Nightmare disorder: Secondary | ICD-10-CM | POA: Diagnosis not present

## 2023-12-13 DIAGNOSIS — F99 Mental disorder, not otherwise specified: Secondary | ICD-10-CM | POA: Diagnosis not present

## 2023-12-13 DIAGNOSIS — F900 Attention-deficit hyperactivity disorder, predominantly inattentive type: Secondary | ICD-10-CM

## 2023-12-13 DIAGNOSIS — M797 Fibromyalgia: Secondary | ICD-10-CM

## 2023-12-13 DIAGNOSIS — F411 Generalized anxiety disorder: Secondary | ICD-10-CM | POA: Diagnosis not present

## 2023-12-13 DIAGNOSIS — F331 Major depressive disorder, recurrent, moderate: Secondary | ICD-10-CM

## 2023-12-13 DIAGNOSIS — M1812 Unilateral primary osteoarthritis of first carpometacarpal joint, left hand: Secondary | ICD-10-CM | POA: Diagnosis not present

## 2023-12-13 DIAGNOSIS — G8929 Other chronic pain: Secondary | ICD-10-CM | POA: Diagnosis not present

## 2023-12-13 NOTE — Progress Notes (Signed)
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 recurrent episode, moderate (HCC)  Generalized anxiety disorder  Insomnia due to other mental disorder  Attention deficit hyperactivity disorder (ADHD), predominantly inattentive type  Nightmares  Fibromyalgia   Please see After Visit Summary for patient specific instructions.  30 min f face to face time with patient was spent on counseling and coordination of care. 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  Most of the improvement has come through therapy and control of the headaches with some benefit from the medication as well particularly the sertraline .   The benefits of the meds for sleep are clear as we tried to discontinue clonazepam  and she had worsening of not only sleep but also her other psychiatric symptoms.  Old chart reviewed in detail with patient re: sleep and anxiety meds.  Failed multiple others  New after therapist Nathanel Aran retired.  Has been really helpful.    The clonazepam  is medically necessary.  She  is failed multiple other sleep medications.  She is aware of sleep hygiene issues in detail. We discussed the short-term risks associated with benzodiazepines including sedation and increased fall risk among others.  Discussed long-term side effect risk including sedative SE, dependence, potential withdrawal symptoms, and the potential eventual  dose-related risk of dementia.  But recent studies from 2020 dispute this association between benzodiazepines and dementia risk. Newer studies in 2020 do not support an association with dementia.   sHe is tolerating meds well.    Recently addressing B12 and low D  Consider try NAC for mild cognitive complaints. (NAC)  N-Acetylcysteine at 600 mg 2 daily to help with mild cognitive problems  Other option Aricept, .   Continue armodafinil  150 mg AM vs Sunosi and take consistently stopped lamotrigine  back to 100 DT wt gain    stopped Auvelity  bc she felt she developed SE including wt gain   continue sertraline  37.5 mg bc worse with less.  But consider reduction bc therapy helped and Auvelity  is helping. Continue trazodone  100 mg HS Continue clonazepam  1 mg HS and consider switch to Lunesta  Disc SE in detail and SSRI withdrawal sx.  Extensive med discussion.  See rheum for chronic fatigue.   Discussed potential benefits, risks, and side effects of stimulants with patient to include increased heart rate, palpitations, insomnia, increased anxiety, increased irritability, or decreased appetite.  Instructed patient to contact Marisa if experiencing any significant tolerability issues. Trial Sunosi 37.5-75 mg AM   FU 2 mos  Lorene Macintosh MD, DFAPA  Future Appointments  Date Time Provider Department [place]  02/04/2024  1:30 PM Karis Clunes, MD [facility] None  02/26/2024  2:30 PM Cottle, Lorene KANDICE Raddle., MD [facility] None  03/17/2024  2:30 PM Amundson JAYSON Nikki Bobie FORBES, MD [facility] None    No orders of the defined types were placed in this encounter.      -------------------------------

## 2023-12-17 ENCOUNTER — Other Ambulatory Visit: Payer: Self-pay | Admitting: Psychiatry

## 2023-12-17 DIAGNOSIS — F5105 Insomnia due to other mental disorder: Secondary | ICD-10-CM

## 2023-12-31 DIAGNOSIS — F325 Major depressive disorder, single episode, in full remission: Secondary | ICD-10-CM | POA: Diagnosis not present

## 2023-12-31 DIAGNOSIS — G43709 Chronic migraine without aura, not intractable, without status migrainosus: Secondary | ICD-10-CM | POA: Diagnosis not present

## 2023-12-31 DIAGNOSIS — G43009 Migraine without aura, not intractable, without status migrainosus: Secondary | ICD-10-CM | POA: Diagnosis not present

## 2023-12-31 DIAGNOSIS — R5383 Other fatigue: Secondary | ICD-10-CM | POA: Diagnosis not present

## 2024-01-01 ENCOUNTER — Other Ambulatory Visit: Payer: Self-pay | Admitting: Psychiatry

## 2024-01-01 DIAGNOSIS — F5105 Insomnia due to other mental disorder: Secondary | ICD-10-CM

## 2024-01-03 ENCOUNTER — Other Ambulatory Visit: Payer: Self-pay | Admitting: Psychiatry

## 2024-01-03 DIAGNOSIS — F5105 Insomnia due to other mental disorder: Secondary | ICD-10-CM

## 2024-01-06 ENCOUNTER — Other Ambulatory Visit: Payer: Self-pay

## 2024-01-06 ENCOUNTER — Emergency Department (HOSPITAL_BASED_OUTPATIENT_CLINIC_OR_DEPARTMENT_OTHER): Admitting: Radiology

## 2024-01-06 ENCOUNTER — Emergency Department (HOSPITAL_BASED_OUTPATIENT_CLINIC_OR_DEPARTMENT_OTHER)
Admission: EM | Admit: 2024-01-06 | Discharge: 2024-01-06 | Disposition: A | Discharge status: Home / Self Care | Attending: Emergency Medicine | Admitting: Emergency Medicine

## 2024-01-06 ENCOUNTER — Encounter (HOSPITAL_BASED_OUTPATIENT_CLINIC_OR_DEPARTMENT_OTHER): Payer: Self-pay

## 2024-01-06 DIAGNOSIS — M48061 Spinal stenosis, lumbar region without neurogenic claudication: Secondary | ICD-10-CM | POA: Diagnosis not present

## 2024-01-06 DIAGNOSIS — Z7982 Long term (current) use of aspirin: Secondary | ICD-10-CM | POA: Insufficient documentation

## 2024-01-06 DIAGNOSIS — M47816 Spondylosis without myelopathy or radiculopathy, lumbar region: Secondary | ICD-10-CM | POA: Diagnosis not present

## 2024-01-06 DIAGNOSIS — M545 Low back pain, unspecified: Secondary | ICD-10-CM | POA: Diagnosis present

## 2024-01-06 DIAGNOSIS — M5416 Radiculopathy, lumbar region: Secondary | ICD-10-CM | POA: Diagnosis not present

## 2024-01-06 LAB — URINALYSIS, ROUTINE W REFLEX MICROSCOPIC
Bacteria, UA: NONE SEEN
Glucose, UA: NEGATIVE mg/dL
Nitrite: NEGATIVE
Protein, ur: 30 mg/dL — AB
Specific Gravity, Urine: >1.046 — ABNORMAL HIGH (ref 1.005–1.030)
pH: 6 (ref 5.0–8.0)

## 2024-01-06 MED ORDER — OXYCODONE HCL 5 MG PO TABS: 2.5000 mg | ORAL_TABLET | Freq: Four times a day (QID) | ORAL | 0 refills | Status: AC | PRN

## 2024-01-06 MED ORDER — ONDANSETRON 4 MG PO TBDP
4.0000 mg | ORAL_TABLET | Freq: Once | ORAL | Status: AC
  Administered 2024-01-06: 4 mg via ORAL
  Filled 2024-01-06: qty 1

## 2024-01-06 MED ORDER — KETOROLAC TROMETHAMINE 60 MG/2ML IM SOLN
30.0000 mg | Freq: Once | INTRAMUSCULAR | Status: AC
Start: 2024-01-06 — End: 2024-01-06
  Administered 2024-01-06: 30 mg via INTRAMUSCULAR
  Filled 2024-01-06: qty 2

## 2024-01-06 MED ORDER — METHYLPREDNISOLONE 4 MG PO TBPK: ORAL_TABLET | ORAL | 0 refills | Status: DC

## 2024-01-06 MED ORDER — OXYCODONE-ACETAMINOPHEN 5-325 MG PO TABS
1.0000 | ORAL_TABLET | Freq: Once | ORAL | Status: AC
  Administered 2024-01-06: 1 via ORAL
  Filled 2024-01-06: qty 1

## 2024-01-06 MED ORDER — DEXAMETHASONE SODIUM PHOSPHATE 10 MG/ML IJ SOLN
10.0000 mg | Freq: Once | INTRAMUSCULAR | Status: AC
  Administered 2024-01-06: 10 mg via INTRAMUSCULAR
  Filled 2024-01-06: qty 1

## 2024-01-06 MED ORDER — CELECOXIB 200 MG PO CAPS: 200.0000 mg | ORAL_CAPSULE | Freq: Two times a day (BID) | ORAL | 0 refills | Status: DC

## 2024-01-06 MED ORDER — DEXAMETHASONE SODIUM PHOSPHATE 10 MG/ML IJ SOLN: 10.0000 mg | Freq: Once | INTRAMUSCULAR | Status: DC

## 2024-01-06 NOTE — ED Triage Notes (Signed)
 Patient reports back pain for 3 days and it now is radiating down her leg. She denies injuries.

## 2024-01-06 NOTE — ED Provider Notes (Signed)
 [place] EMERGENCY DEPARTMENT AT [facility] Provider Note   CSN: [id number] Arrival date & time: 01/06/24  1528     Patient presents with: Back Pain   Marisa [place] is a 63 y.o. female who presents emergency department chief complaint of back pain.  Patient reports that she has had a few weeks of lower back pain that have been fairly constant.  She has a past medical history of chronic back pain and in the past has needed facet injections several years ago.  Patient reports that over the last week she developed severe pain starting in her lower back that wraps around the front of her leg anterior thigh and around the medial side of her knee and anterior shin and top of her foot.  She states that she felt like it would just go away over a week however it progressively worsened to the point where trying to bear weight on the leg has become unbearable.  She denies any weakness of the lower extremity.  She denies saddle anesthesia or bowel or bladder incontinence. I reviewed patient's past medical history.  Patient reports an extensive GI history but reports she has never officially been confirmed with ulcerative colitis despite that being put in her problem list.  Furthermore patient denies ever having a history of tubular adenoma of the colon.  She was unaware that this was in her chart.  I reviewed previous colonoscopies from 2017 and 2022 I do not see that there was ever a mention of tubular adenoma.  This is also not mentioned in her GI history that is up-to-date with her gastroenterologist.    Back Pain      Prior to Admission medications   Medication Sig Start Date End Date Taking? Authorizing Provider  Armodafinil  150 MG tablet TAKE 1 TABLET BY MOUTH EVERY DAY 12/04/23   Cottle, Lorene KANDICE Raddle., MD  aspirin EC 81 MG tablet Take 81 mg by mouth daily. Swallow whole.    [provider]  chlorproMAZINE (THORAZINE) 25 MG tablet PRN 07/19/20   [provider]   clonazePAM  (KLONOPIN ) 1 MG tablet TAKE 1 TABLET BY MOUTH AT BEDTIME 01/03/24   Cottle, Lorene KANDICE Raddle., MD  cyanocobalamin  (VITAMIN B12) 1000 MCG tablet Take 1,000 mcg by mouth daily.    [provider]  Dextromethorphan-buPROPion  ER (AUVELITY ) 45-105 MG TBCR Take 1 tablet by mouth 2 (two) times daily. Patient not taking: Reported on 12/13/2023 09/30/23   Rhys Boyer T, PA-C  famotidine  (PEPCID ) 20 MG tablet Take 20 mg by mouth daily as needed for heartburn or indigestion. Pt taking in AM and PM    [provider]  fluticasone  (FLONASE ) 50 MCG/ACT nasal spray Place 2 sprays into both nostrils daily. 09/05/23   Shelah Lamar RAMAN, MD  ketorolac  (TORADOL ) 60 MG/2ML SOLN injection Inject 60 mg into the muscle as needed (migraines).  11/06/19   [provider]  Lasmiditan Succinate (REYVOW) 100 MG TABS Take 100 mg by mouth as needed.    [provider]  metoprolol  succinate (TOPROL  XL) 25 MG 24 hr tablet Take 1 tablet (25 mg total) by mouth daily. 05/04/23   Shlomo Wilbert SAUNDERS, MD  omeprazole  (PRILOSEC) 40 MG capsule Take 1 capsule (40 mg total) by mouth daily. 10/19/23   Nandigam, Kavitha V, MD  ondansetron  (ZOFRAN ) 4 MG tablet TAKE 1 TABLET BY MOUTH EVERY 8 HOURS AS NEEDED FOR NAUSEA AND VOMITING 07/21/21   Nandigam, Kavitha V, MD  rizatriptan (MAXALT) 10 MG  tablet Take 10 mg by mouth as needed for migraine. 05/17/21   [provider]  rosuvastatin  (CRESTOR ) 10 MG tablet Take 1 tablet (10 mg total) by mouth daily. 06/18/23 12/13/23  Shlomo Wilbert SAUNDERS, MD  sertraline  (ZOLOFT ) 25 MG tablet TAKE 1 AND 1/2 TABLETS BY MOUTH ONCE DAILY 11/15/23   Cottle, Lorene KANDICE Raddle., MD  simethicone  (MYLICON) 125 MG chewable tablet Chew 125 mg by mouth every 6 (six) hours as needed for flatulence.    [provider]  Sodium Sulfate-Mag Sulfate-KCl (SUTAB ) 1479-225-188 MG TABS Use as directed for colonoscopy. MANUFACTURER CODES!! BIN: [id number] PCN: CN GROUP: [id number] MEMBER ID: [id number];MLW AS  SECONDARY INSURANCE ;NO PRIOR AUTHORIZATION 07/11/23   Nandigam, Kavitha V, MD  tiZANidine  (ZANAFLEX ) 4 MG tablet Take 1 tablet (4 mg total) by mouth at bedtime. 12/21/21   Cottle, Lorene KANDICE Raddle., MD  topiramate  (TOPAMAX ) 50 MG tablet Take 1 tablet (50 mg total) by mouth 2 (two) times daily. Patient taking differently: Take 75 mg by mouth every evening. 01/25/20   Prentiss Frieze, DO  traZODone  (DESYREL ) 100 MG tablet TAKE 1 TABLET BY MOUTH AT BEDTIME 11/15/23   Cottle, Lorene KANDICE Raddle., MD  VITAMIN D  PO Take 4,000 Units by mouth daily.    [provider]  Wheat Dextrin (BENEFIBER PO) Take by mouth.    [provider]    Allergies: Selenium dioxide [selenium], Gluten meal, Milk (cow), Nsaids, Phentermine, Amitriptyline hcl, Bacid, Meloxicam, and Milk-related compounds    Review of Systems  Musculoskeletal:  Positive for back pain.    Updated Vital Signs BP 109/79 (BP Location: Left Arm)   Pulse 65   Temp 98.1 F (36.7 C)   Resp 16   LMP 07/29/2012 Comment: spotting that week  SpO2 98%   Physical Exam Vitals and nursing note reviewed.  Constitutional:      General: She is not in acute distress.    Appearance: She is well-developed. She is not diaphoretic.  HENT:     Head: Normocephalic and atraumatic.     Right Ear: External ear normal.     Left Ear: External ear normal.     Nose: Nose normal.     Mouth/Throat:     Mouth: Mucous membranes are moist.  Eyes:     General: No scleral icterus.    Conjunctiva/sclera: Conjunctivae normal.  Cardiovascular:     Rate and Rhythm: Normal rate and regular rhythm.     Heart sounds: Normal heart sounds. No murmur heard.    No friction rub. No gallop.  Pulmonary:     Effort: Pulmonary effort is normal. No respiratory distress.     Breath sounds: Normal breath sounds.  Abdominal:     General: Bowel sounds are normal. There is no distension.     Palpations: Abdomen is soft. There is no mass.     Tenderness: There is no abdominal  tenderness. There is no guarding.  Musculoskeletal:     Cervical back: Normal range of motion.     Comments: Equal strength at the ankles with dorsi and plantarflexion.  DP pulse 2+ bilaterally with normal sensation.  She has 1+ patellar reflexes bilaterally.  Skin:    General: Skin is warm and dry.  Neurological:     Mental Status: She is alert and oriented to person, place, and time.  Psychiatric:        Behavior: Behavior normal.     (all labs ordered are listed, but only abnormal results are displayed)  Labs Reviewed  URINALYSIS, ROUTINE W REFLEX MICROSCOPIC - Abnormal; Notable for the following components:      Result Value   Specific Gravity, Urine >1.046 (*)    Hgb urine dipstick SMALL (*)    Bilirubin Urine SMALL (*)    Ketones, ur TRACE (*)    Protein, ur 30 (*)    Leukocytes,Ua SMALL (*)    All other components within normal limits    EKG: None  Radiology: DG Lumbar Spine Complete Result Date: 01/06/2024 CLINICAL DATA:  Low back pain for 1 week radiating down left leg. No known injury. EXAM: LUMBAR SPINE - COMPLETE 4+ VIEW COMPARISON:  03/22/2016. FINDINGS: There is no evidence of acute lumbar spine fracture. Alignment is normal. Mild intervertebral disc space narrowing is noted at L2-L3 and L3-L4. Mild multilevel degenerative endplate changes are noted. There is facet arthropathy in the lower lumbar spine. IMPRESSION: 1. No acute fracture. 2. Multilevel degenerative changes Electronically Signed   By: Leita Birmingham M.D.   On: 01/06/2024 17:16     Procedures   Medications Ordered in the ED  ketorolac  (TORADOL ) injection 30 mg (30 mg Intramuscular Given 01/06/24 1735)  oxyCODONE -acetaminophen  (PERCOCET/ROXICET) 5-325 MG per tablet 1 tablet (1 tablet Oral Given 01/06/24 1732)  ondansetron  (ZOFRAN -ODT) disintegrating tablet 4 mg (4 mg Oral Given 01/06/24 1732)  dexamethasone  (DECADRON ) injection 10 mg (10 mg Intramuscular Given 01/06/24 1734)    Clinical Course as of  01/06/24 2317  Sun Jan 06, 2024  1756 Urinalysis, Routine w reflex microscopic -Urine, Clean Catch(!) Patient has a history of interstitial cystitis and is not particularly complaining of  urinary symptoms at this time.  Suspect this is likely due to her history.  Urinary symptoms.  Review of urine findings appears to be consistent with previous urinalyses [AH]  1847 Visualized and interpreted lumbar plain film which shows disc space height loss at L2-L3 L3-L4 which would correlate with the dermatome on the anterior thigh which is the area of complaint [AH]  1848 I reevaluated the patient at bedside.  She is still having pain however she is able to ambulate on the leg and tolerate weightbearing as she was unable to do this earlier. [AH]    Clinical Course User Index [AH] Arloa Chroman, PA-C                                 Medical Decision Making Amount and/or Complexity of Data Reviewed Labs: ordered. Decision-making details documented in ED Course. Radiology: ordered.  Risk Prescription drug management.   63 year old female with a history of chronic back pain who presents emergency department with several weeks of lower back pain and 1 week of severe pain radiating down the front of her leg consistent with radiculopathy.  She has no evidence of myelopathy as there are no obvious upper motor neuron deficits such as clonus of the ankle or hyperreflexia.  Patient is not weak in the lower extremity and has no foot drop.  She also has no signs or symptoms of cauda equina.  I reviewed the patient's urine.  I do not think that this represents an acute urinary tract infection patient has a history of interstitial cystitis and has no urinary symptoms.  I visualized and interpreted a lumbar film x-ray as per the ED course which shows L2-L3/L3-L4 disc space height loss.  This would correlate with the radicular pattern of pain on the patient's left leg.  She was treated in the emergency department with  oral oxycodone , IM Decadron , IM Toradol  with significant improvement in her ambulatory status and moderate amount of pain improvement as well.  The patient will be discharged with treatment on Celebrex , Medrol  taper, small amount of oxycodone .  She is advised to follow close with primary care, seek PT eval and treatment.  I have low suspicion for other serious or emergent causes of back pain as patient is not a much immunocompromise I doubt infection, discitis, epidural abscess she has had no recent interventions or injections in her back.  Patient does not use IV drugs. Patient further does not have a history of colon cancer as per review of her EMR at bedside with the patient.  I have low suspicion for metastasis of any kind and or primary bony mass.  And there is no evidence of this on her x-ray.  Patient appears otherwise appropriate for discharge with conservative treatment, close outpatient follow-up and strict return precautions.  PDMP reviewed during this encounter.      Final diagnoses:  None    ED Discharge Orders     None          Arloa Chroman, PA-C 01/06/24 2320    Mannie Pac T, DO 01/07/24 1105

## 2024-01-06 NOTE — Discharge Instructions (Signed)
 ### Lumbar Radiculopathy Care Guide     **What Is Lumbar Radiculopathy?**      Lumbar radiculopathy means that a nerve in the lower back is irritated or compressed, often by a bulging or herniated disc. This can cause pain, numbness, tingling, or weakness in the back, hip, or leg. Most people improve over time with simple treatments and do not need surgery.[1][2][3]      **Your Current Treatment**      - **Celecoxib  200 mg twice daily:** This is a nonsteroidal anti-inflammatory drug (NSAID) that helps reduce pain and swelling. Take it with food to lower the risk of stomach upset. Let your doctor know if you have a history of stomach ulcers, kidney problems, or heart disease.[3]      - **Methylprednisolone  dose pack:** This is a short course of steroid medication to help decrease inflammation and pain. Take it exactly as prescribed, following the tapering schedule. Some people may notice side effects like trouble sleeping, increased appetite, or mood changes, but these usually go away after stopping the medication.[4][3]      - **Oxycodone  (short-term use):** This is a strong pain medication for severe pain. Use only as needed and as prescribed. Oxycodone  can cause drowsiness, constipation, and, if used for too long, dependence. Do not drive or operate heavy machinery while taking this medication.[2][3]      **Physical Therapy**      Physical therapy is a key part of recovery. A physical therapist will teach you exercises to help reduce pain, improve movement, and strengthen your back and core muscles. Staying active as much as possible is important--bed rest is not recommended.[5][1][6][2][3] Exercises may include gentle stretching, walking, and movements that help your pain move away from your leg and toward your back (this is called centralization and is a good sign).[1][6]      **Expected Course**      - Most people with lumbar radiculopathy start to feel better within a few weeks, and many  recover fully within 6 to 12 weeks.[2][3]      - Medications and physical therapy help manage symptoms while your body heals.      - Surgery is rarely needed and is usually only considered if symptoms do not improve after several weeks or if there are serious nerve problems.[2][3]      **Follow-Up Instructions**      - **Primary Care Provider:** Schedule a follow-up appointment within 2-4 weeks, or sooner if your symptoms worsen or you have concerns.      - **Physical Therapy:** Attend your physical therapy evaluation and follow the recommended treatment plan.      - **Medication:** Take all medications as prescribed. Do not stop or change your medications without talking to your doctor.      **When to Seek Immediate Medical Care**      Call your doctor or go to the emergency room right away if you have any of the following:      - New or worsening weakness in your legs      - Loss of feeling or numbness in your groin or inner thighs      - Trouble controlling your bladder or bowels (incontinence)      - Severe pain that does not improve with medication      - High fever, chills, or unexplained weight loss      These symptoms could be signs of a more serious problem that needs urgent attention.[2][3][7]      **Self-Care Tips**      -  Stay as active as you can, but avoid activities that make your pain much worse.      - Use heat or cold packs on your back if they help.      - Practice good posture and avoid heavy lifting.      - If you have questions or concerns about your medications or exercises, contact your healthcare provider.      **Summary**      Most people with lumbar radiculopathy improve with time, medications, and physical therapy. Staying active and following your treatment plan are important for recovery. Watch for any warning signs and keep your follow-up appointments.[5][1][2][6][3][4]      ### References  1. Sciatica. Ropper AH, Zafonte RD. The [place] Journal of Medicine. 2015;372(13):1240-8. doi:10.1056/[id number]. 2. Herniated Lumbar Intervertebral Disk. Deyo RA, Jackee FREED. The [place] Journal of Medicine. 2016;374(18):1763-72. doi:10.1056/NEJMcp1512658. 3. Noninvasive Treatments for Acute, Subacute, and Chronic Low Back Pain: A Clinical Practice Guideline From the [employer]. Qaseem A, Wilt TJ, McLean RM, et al. Annals of Internal Medicine. 2017;166(7):514-530. doi:10.7326/M16-2367. 4. Oral Steroids for Acute Radiculopathy Due to a Herniated Lumbar Disk: A Randomized Clinical Trial. Perry DEL, Matias LELON Rawleigh CHRISTELLA, et al. JAMA. 2015;313(19):1915-23. doi:10.1001/jama.7984.5531. 5. Conservative Management for Lumbar Radiculopathy Based on the Stage of the Disorder: A [employer] Study. Thoomes E, Falla D, Odean SHILLING, et al. Disability and Rehabilitation. 2023;45(21):3539-3548. doi:10.1080/[id number]. 6. Non-Surgical Approaches to the Management of Lumbar Disc Herniation Associated With Radiculopathy: A Narrative Review. El Melhat AM, Youssef ASA, Zebdawi MR, et al. Journal of Clinical Medicine. 2024;13(4):974. doi:10.3390/[id number]. 7. Chronic Low Back Pain in Adults: Evaluation and Management. Maharty DC, [place], [place]. American Family Physician. 2024;109(3):233-244.

## 2024-01-08 DIAGNOSIS — M5416 Radiculopathy, lumbar region: Secondary | ICD-10-CM | POA: Diagnosis not present

## 2024-01-21 DIAGNOSIS — M5416 Radiculopathy, lumbar region: Secondary | ICD-10-CM | POA: Diagnosis not present

## 2024-01-25 DIAGNOSIS — M545 Low back pain, unspecified: Secondary | ICD-10-CM | POA: Diagnosis not present

## 2024-01-28 DIAGNOSIS — M51369 Other intervertebral disc degeneration, lumbar region without mention of lumbar back pain or lower extremity pain: Secondary | ICD-10-CM | POA: Diagnosis not present

## 2024-01-28 DIAGNOSIS — M47819 Spondylosis without myelopathy or radiculopathy, site unspecified: Secondary | ICD-10-CM | POA: Diagnosis not present

## 2024-01-29 DIAGNOSIS — F320 Major depressive disorder, single episode, mild: Secondary | ICD-10-CM | POA: Diagnosis not present

## 2024-01-29 DIAGNOSIS — E6609 Other obesity due to excess calories: Secondary | ICD-10-CM | POA: Diagnosis not present

## 2024-01-29 DIAGNOSIS — Z789 Other specified health status: Secondary | ICD-10-CM | POA: Diagnosis not present

## 2024-01-29 DIAGNOSIS — E538 Deficiency of other specified B group vitamins: Secondary | ICD-10-CM | POA: Diagnosis not present

## 2024-01-29 DIAGNOSIS — M545 Low back pain, unspecified: Secondary | ICD-10-CM | POA: Diagnosis not present

## 2024-01-29 DIAGNOSIS — E559 Vitamin D deficiency, unspecified: Secondary | ICD-10-CM | POA: Diagnosis not present

## 2024-01-29 DIAGNOSIS — R7303 Prediabetes: Secondary | ICD-10-CM | POA: Diagnosis not present

## 2024-01-29 DIAGNOSIS — R5383 Other fatigue: Secondary | ICD-10-CM | POA: Diagnosis not present

## 2024-01-29 DIAGNOSIS — I10 Essential (primary) hypertension: Secondary | ICD-10-CM | POA: Diagnosis not present

## 2024-01-29 DIAGNOSIS — E785 Hyperlipidemia, unspecified: Secondary | ICD-10-CM | POA: Diagnosis not present

## 2024-01-29 DIAGNOSIS — K5190 Ulcerative colitis, unspecified, without complications: Secondary | ICD-10-CM | POA: Diagnosis not present

## 2024-01-29 DIAGNOSIS — Z0000 Encounter for general adult medical examination without abnormal findings: Secondary | ICD-10-CM | POA: Diagnosis not present

## 2024-02-04 ENCOUNTER — Ambulatory Visit (INDEPENDENT_AMBULATORY_CARE_PROVIDER_SITE_OTHER): Admitting: Otolaryngology

## 2024-02-04 VITALS — BP 111/79 | HR 52 | Temp 97.9°F | Ht 64.4 in | Wt 195.0 lb

## 2024-02-04 DIAGNOSIS — J343 Hypertrophy of nasal turbinates: Secondary | ICD-10-CM | POA: Diagnosis not present

## 2024-02-04 DIAGNOSIS — R519 Headache, unspecified: Secondary | ICD-10-CM

## 2024-02-04 DIAGNOSIS — J310 Chronic rhinitis: Secondary | ICD-10-CM

## 2024-02-04 DIAGNOSIS — R0981 Nasal congestion: Secondary | ICD-10-CM | POA: Diagnosis not present

## 2024-02-04 DIAGNOSIS — J342 Deviated nasal septum: Secondary | ICD-10-CM | POA: Diagnosis not present

## 2024-02-04 MED ORDER — PREDNISONE 10 MG (21) PO TBPK: ORAL_TABLET | ORAL | 0 refills | Status: DC

## 2024-02-05 DIAGNOSIS — R519 Headache, unspecified: Secondary | ICD-10-CM | POA: Insufficient documentation

## 2024-02-05 DIAGNOSIS — J342 Deviated nasal septum: Secondary | ICD-10-CM | POA: Insufficient documentation

## 2024-02-05 DIAGNOSIS — J343 Hypertrophy of nasal turbinates: Secondary | ICD-10-CM | POA: Insufficient documentation

## 2024-02-05 DIAGNOSIS — J310 Chronic rhinitis: Secondary | ICD-10-CM | POA: Insufficient documentation

## 2024-02-05 NOTE — Progress Notes (Signed)
 CC: Recurrent sinusitis  HPI:  Marisa Hamilton is a 63 y.o. female who presents today complaining of recurrent sinusitis over the past year.  According to the patient, she has frequent facial pain and pressure, nasal congestion, postnasal drainage, and frequent coughing spells.  She was symptomatic multiple times in February, April, and July of this year.  She was treated with 4 courses of antibiotics and prednisone .  She saw Dr. Llewellyn at [facility] last year.  Her CT scan at that time was negative.  The patient has no known environmental allergies.  Her previous allergy  testing was negative.  She has a history of migraine headaches.  She is currently on prophylactic migraine medication.  Currently she is on loratadine and nasal saline irrigation daily.    Past Medical History:  Diagnosis Date   Anxiety    Back pain    CAD (coronary artery disease), native coronary artery    50% prox LAD and <25% prox RCA>>noncalcified plaque by CTA 05/2023   CFS (chronic fatigue syndrome)    Concussion 03/17/2017   Constipation    Cystitis, interstitial    HAD BLADDER SX FOR SAME   Depression    Fainting spell    Food allergy     Foot pain    GERD (gastroesophageal reflux disease)    Headache(784.0)    IBS (irritable bowel syndrome)    TAKES PROBIOTICS   Insomnia    Lactose intolerance    Leg pain    Migraine    MVP (mitral valve prolapse)    Mild posterior mitral valve prolapse with mild MR by echo 05/2023   Nausea    Neck pain    PAT (paroxysmal atrial tachycardia)    Noted on heart monitor 04/2023   PONV (postoperative nausea and vomiting)    PVC's (premature ventricular contractions)    PVC load 17.6% by heart monitor 04/2023   Recurrent upper respiratory infection (URI)    Restless leg syndrome    Stomach ache    Ulcerative colitis (HCC)    Varicose veins     Past Surgical History:  Procedure Laterality Date   ABDOMINOPLASTY  08/2012   BLADDER SURGERY     FOR IC    BREAST BIOPSY Left 02/26/2012   negative   BREAST REDUCTION SURGERY  10/10/2011   Procedure: MAMMARY REDUCTION  (BREAST);  Surgeon: Ronal DELENA Mage, MD;  Location: [facility];  Service: Plastics;  Laterality: Bilateral;   COLONOSCOPY  10/22/2015   Dr.Mann   ESOPHAGOGASTRODUODENOSCOPY ENDOSCOPY     LASIK     NASAL SEPTUM SURGERY     REDUCTION MAMMAPLASTY Bilateral    VASCULAR SURGERY     VARICOSE VEINS   WRIST ARTHROSCOPY WITH CARPOMETACARPEL [name]) ARTHROPLASTY Left 10/20/2022    Family History  Problem Relation Age of Onset   Osteoporosis Mother    Atrial fibrillation Mother    Heart disease Mother    Stroke Mother    Depression Mother    Anxiety disorder Mother    Obesity Mother    Angioedema Father    Diabetes Father    Hypertension Father    Depression Father    Obesity Father    Migraines Brother        CHRONIC   Breast cancer Maternal Grandmother    Cancer Maternal Grandmother        ovarian   Diabetes Paternal Grandmother    Colon cancer Neg Hx    Esophageal cancer Neg Hx  Pancreatic cancer Neg Hx    Stomach cancer Neg Hx    Liver disease Neg Hx    Rectal cancer Neg Hx    Colon polyps Neg Hx     Social History:  reports that she has never smoked. She has never used smokeless tobacco. She reports that she does not currently use alcohol. She reports that she does not use drugs.  Allergies:  Allergies  Allergen Reactions   Selenium Dioxide [Selenium] Anaphylaxis   Gluten Meal Other (See Comments)    Stomach upset/ Pt can have in mosiration   Milk (Cow) Other (See Comments)    Stomach upset   Nsaids Other (See Comments)    Other reaction(s): Abdominal Pain Ulcerative colitis Other reaction(s): Abdominal Pain Ulcerative colitis Ulcerative colitis Other reaction(s): Abdominal Pain Ulcerative colitis   Phentermine Other (See Comments)    Pt stated, Made my IC flare up Pt stated, Made my IC flare up Pt stated, Made my IC flare  up   Amitriptyline Hcl Other (See Comments)   Bacid Other (See Comments)   Meloxicam Nausea And Vomiting   Milk-Related Compounds Other (See Comments)    Stomach upset/ pt can have in modiration    Prior to Admission medications   Medication Sig Start Date End Date Taking? Authorizing Provider  Armodafinil  150 MG tablet TAKE 1 TABLET BY MOUTH EVERY DAY 12/04/23  Yes Cottle, Lorene KANDICE Raddle., MD  aspirin EC 81 MG tablet Take 81 mg by mouth daily. Swallow whole.   Yes [provider]  Atogepant (QULIPTA) 60 MG TABS Take 60 mg by mouth daily.   Yes [provider]  celecoxib  (CELEBREX ) 200 MG capsule Take 1 capsule (200 mg total) by mouth 2 (two) times daily. 01/06/24  Yes Harris, Abigail, PA-C  chlorproMAZINE (THORAZINE) 25 MG tablet PRN 07/19/20  Yes [provider]  clonazePAM  (KLONOPIN ) 1 MG tablet TAKE 1 TABLET BY MOUTH AT BEDTIME 01/03/24  Yes Cottle, Lorene KANDICE Raddle., MD  cyanocobalamin  (VITAMIN B12) 1000 MCG tablet Take 1,000 mcg by mouth daily.   Yes [provider]  famotidine  (PEPCID ) 20 MG tablet Take 20 mg by mouth daily as needed for heartburn or indigestion. Pt taking in AM and PM   Yes [provider]  fluticasone  (FLONASE ) 50 MCG/ACT nasal spray Place 2 sprays into both nostrils daily. 09/05/23  Yes Shelah Lamar RAMAN, MD  ketorolac  (TORADOL ) 60 MG/2ML SOLN injection Inject 60 mg into the muscle as needed (migraines).  11/06/19  Yes [provider]  Lasmiditan Succinate (REYVOW) 100 MG TABS Take 100 mg by mouth as needed.   Yes [provider]  methylPREDNISolone  (MEDROL  DOSEPAK) 4 MG TBPK tablet Use as directed 01/06/24  Yes Harris, Abigail, PA-C  metoprolol  succinate (TOPROL  XL) 25 MG 24 hr tablet Take 1 tablet (25 mg total) by mouth daily. 05/04/23  Yes Turner, Wilbert SAUNDERS, MD  omeprazole  (PRILOSEC) 40 MG capsule Take 1 capsule (40 mg total) by mouth daily. 10/19/23  Yes Nandigam, Kavitha V, MD  ondansetron  (ZOFRAN ) 4 MG tablet TAKE 1  TABLET BY MOUTH EVERY 8 HOURS AS NEEDED FOR NAUSEA AND VOMITING 07/21/21  Yes Nandigam, Kavitha V, MD  oxyCODONE  (ROXICODONE ) 5 MG immediate release tablet Take 0.5-1 tablets (2.5-5 mg total) by mouth every 6 (six) hours as needed for severe pain (pain score 7-10). 01/06/24  Yes Harris, Abigail, PA-C  predniSONE  (STERAPRED UNI-PAK 21 TAB) 10 MG (21) TBPK tablet Per instructions (6,5,4,3,2,1) 02/04/24  Yes Karis Clunes, MD  rizatriptan (MAXALT) 10 MG tablet Take 10 mg by mouth as needed for migraine. 05/17/21  Yes [provider]  rosuvastatin  (CRESTOR ) 10 MG tablet Take 1 tablet (10 mg total) by mouth daily. 06/18/23 02/04/24 Yes Turner, Wilbert SAUNDERS, MD  sertraline  (ZOLOFT ) 25 MG tablet TAKE 1 AND 1/2 TABLETS BY MOUTH ONCE DAILY 11/15/23  Yes Cottle, Lorene KANDICE Raddle., MD  simethicone  (MYLICON) 125 MG chewable tablet Chew 125 mg by mouth every 6 (six) hours as needed for flatulence.   Yes [provider]  Sodium Sulfate-Mag Sulfate-KCl (SUTAB ) 1479-225-188 MG TABS Use as directed for colonoscopy. MANUFACTURER CODES!! BIN: [id number] PCN: CN GROUP: [id number] MEMBER ID: [id number];MLW AS SECONDARY INSURANCE ;NO PRIOR AUTHORIZATION 07/11/23  Yes Nandigam, Kavitha V, MD  tiZANidine  (ZANAFLEX ) 4 MG tablet Take 1 tablet (4 mg total) by mouth at bedtime. 12/21/21  Yes Cottle, Lorene KANDICE Raddle., MD  traZODone  (DESYREL ) 100 MG tablet TAKE 1 TABLET BY MOUTH AT BEDTIME 11/15/23  Yes Cottle, Lorene KANDICE Raddle., MD  VITAMIN D  PO Take 4,000 Units by mouth daily.   Yes [provider]  Wheat Dextrin (BENEFIBER PO) Take by mouth.   Yes [provider]  Dextromethorphan-buPROPion  ER (AUVELITY ) 45-105 MG TBCR Take 1 tablet by mouth 2 (two) times daily. Patient not taking: Reported on 02/04/2024 09/30/23   Rhys Boyer T, PA-C  topiramate  (TOPAMAX ) 50 MG tablet Take 1 tablet (50 mg total) by mouth 2 (two) times daily. Patient not taking: Reported on 02/04/2024 01/25/20   Prentiss Frieze, DO    Blood pressure 111/79, pulse (!) 52,  temperature 97.9 F (36.6 C), height 5' 4.4 (1.636 m), weight 195 lb (88.5 kg), last menstrual period 07/29/2012, SpO2 94%. Exam: General: Communicates without difficulty, well nourished, no acute distress. Head: Normocephalic, no evidence injury, no tenderness, facial buttresses intact without stepoff. Face/sinus: No tenderness to palpation and percussion. Facial movement is normal and symmetric. Eyes: PERRL, EOMI. No scleral icterus, conjunctivae clear. Neuro: CN II exam reveals vision grossly intact.  No nystagmus at any point of gaze. Ears: Auricles well formed without lesions.  Ear canals are intact without mass or lesion.  No erythema or edema is appreciated.  The TMs are intact without fluid. Nose: External evaluation reveals normal support and skin without lesions.  Dorsum is intact.  Anterior rhinoscopy reveals congested mucosa over anterior aspect of inferior turbinates and intact septum.  No purulence noted. Oral:  Oral cavity and oropharynx are intact, symmetric, without erythema or edema.  Mucosa is moist without lesions. Neck: Full range of motion without pain.  There is no significant lymphadenopathy.  No masses palpable.  Thyroid  bed within normal limits to palpation.  Parotid glands and submandibular glands equal bilaterally without mass.  Trachea is midline. Neuro:  CN 2-12 grossly intact.   Procedure:  Flexible Nasal Endoscopy: Description: Risks, benefits, and alternatives of flexible endoscopy were explained to the patient.  Specific mention was made of the risk of throat numbness with difficulty swallowing, possible bleeding from the nose and mouth, and pain from the procedure.  The patient gave oral consent to proceed.  The flexible scope was inserted into the right nasal cavity.  Endoscopy of the interior nasal cavity, superior, inferior, and middle meatus was performed. The sphenoid-ethmoid recess was examined. Edematous mucosa was noted.  No polyp, mass, or lesion was appreciated.  Nasal septal deviation noted. Olfactory cleft was clear.  Nasopharynx was clear.  Turbinates were hypertrophied but without mass.  The procedure was repeated on  the contralateral side with similar findings.  The patient tolerated the procedure well.   Assessment: 1.  Chronic rhinitis with nasal mucosal congestion, nasal septal deviation, and bilateral inferior turbinate hypertrophy. 2.  No polyps, mass, lesion, or purulent drainage is noted today. 3.  There is no evidence of acute infection today.  Plan: 1.  The physical exam and nasal endoscopy findings are extensively reviewed with the patient. 2.  The patient is reassured that no acute infection is noted today.  It is also explained to the patient that some of her sinus symptoms may be secondary to migraine exacerbations. 3.  Flonase  nasal spray 2 sprays each nostril daily.  The importance of consistent daily use is discussed. 4.  Prednisone  Dosepak for 6 days. 5.  Nasal saline irrigation daily.  The instructions on how to perform the irrigation are reviewed. 6.  The patient will return for reevaluation in 1 month.  Caiya Bettes W Keyandra Swenson 02/05/2024, 7:37 AM

## 2024-02-07 DIAGNOSIS — M5416 Radiculopathy, lumbar region: Secondary | ICD-10-CM | POA: Diagnosis not present

## 2024-02-12 ENCOUNTER — Other Ambulatory Visit: Payer: Self-pay | Admitting: Psychiatry

## 2024-02-12 DIAGNOSIS — F331 Major depressive disorder, recurrent, moderate: Secondary | ICD-10-CM

## 2024-02-12 DIAGNOSIS — F411 Generalized anxiety disorder: Secondary | ICD-10-CM

## 2024-02-12 DIAGNOSIS — F5105 Insomnia due to other mental disorder: Secondary | ICD-10-CM

## 2024-02-18 DIAGNOSIS — N209 Urinary calculus, unspecified: Secondary | ICD-10-CM | POA: Diagnosis not present

## 2024-02-18 DIAGNOSIS — R399 Unspecified symptoms and signs involving the genitourinary system: Secondary | ICD-10-CM | POA: Diagnosis not present

## 2024-02-18 DIAGNOSIS — N3010 Interstitial cystitis (chronic) without hematuria: Secondary | ICD-10-CM | POA: Diagnosis not present

## 2024-02-19 DIAGNOSIS — M5416 Radiculopathy, lumbar region: Secondary | ICD-10-CM | POA: Diagnosis not present

## 2024-02-26 ENCOUNTER — Encounter: Payer: Self-pay | Admitting: Psychiatry

## 2024-02-26 ENCOUNTER — Ambulatory Visit: Admitting: Psychiatry

## 2024-02-26 DIAGNOSIS — M797 Fibromyalgia: Secondary | ICD-10-CM

## 2024-02-26 DIAGNOSIS — F900 Attention-deficit hyperactivity disorder, predominantly inattentive type: Secondary | ICD-10-CM

## 2024-02-26 DIAGNOSIS — F515 Nightmare disorder: Secondary | ICD-10-CM | POA: Diagnosis not present

## 2024-02-26 DIAGNOSIS — F99 Mental disorder, not otherwise specified: Secondary | ICD-10-CM | POA: Diagnosis not present

## 2024-02-26 DIAGNOSIS — F411 Generalized anxiety disorder: Secondary | ICD-10-CM | POA: Diagnosis not present

## 2024-02-26 DIAGNOSIS — F331 Major depressive disorder, recurrent, moderate: Secondary | ICD-10-CM | POA: Diagnosis not present

## 2024-02-26 DIAGNOSIS — F5105 Insomnia due to other mental disorder: Secondary | ICD-10-CM

## 2024-02-26 DIAGNOSIS — G2581 Restless legs syndrome: Secondary | ICD-10-CM | POA: Diagnosis not present

## 2024-02-26 MED ORDER — ROPINIROLE HCL 0.5 MG PO TABS: 0.5000 mg | ORAL_TABLET | Freq: Every day | ORAL | 0 refills | Status: DC

## 2024-02-26 MED ORDER — SERTRALINE HCL 25 MG PO TABS: 37.5000 mg | ORAL_TABLET | Freq: Every day | ORAL | 0 refills | Status: AC

## 2024-02-26 MED ORDER — CLONAZEPAM 1 MG PO TABS: 1.0000 mg | ORAL_TABLET | Freq: Every day | ORAL | 2 refills | Status: DC

## 2024-02-26 MED ORDER — TRAZODONE HCL 100 MG PO TABS: 100.0000 mg | ORAL_TABLET | Freq: Every day | ORAL | 0 refills | Status: DC

## 2024-02-26 NOTE — Progress Notes (Unsigned)
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 delusions.  Depression ongoing but mainly DT fatigue    Lab Review:     Component Value Date/Time   NA 141 10/22/2023 0359   NA 140 03/23/2023 1510   K 4.1 10/22/2023 0359   CL 106 10/22/2023 0359   CO2 24 10/22/2023 0359   GLUCOSE 118 (H) 10/22/2023 0359   BUN 21 10/22/2023 0359   BUN 15 03/23/2023 1510   CREATININE 0.94 10/22/2023 0359   CREATININE 0.87 08/04/2013 1100   CALCIUM  9.1 10/22/2023 0359   PROT 6.9 10/22/2023 0359   PROT 6.7 10/06/2019 1711   ALBUMIN 4.0 10/22/2023 0359   ALBUMIN 4.2 10/06/2019 1711   AST 18 10/22/2023 0359   ALT 16 10/22/2023 0359   ALKPHOS 75 10/22/2023 0359   BILITOT <0.2 10/22/2023 0359   BILITOT <0.2 10/06/2019 1711   GFRNONAA >60 10/22/2023 0359   GFRAA 104 10/06/2019 1711       Component Value Date/Time   WBC 9.3 10/22/2023 0359   RBC 4.66 10/22/2023 0359   HGB 13.3 10/22/2023 0359   HGB 14.0 10/06/2019 1711   HGB 13.8 07/30/2013 1333   HCT 41.0 10/22/2023 0359   HCT 41.4 10/06/2019 1711   PLT 227 10/22/2023 0359   PLT 265 10/06/2019 1711   MCV 88.0 10/22/2023 0359   MCV 89 10/06/2019 1711    MCH 28.5 10/22/2023 0359   MCHC 32.4 10/22/2023 0359   RDW 14.4 10/22/2023 0359   RDW 13.4 10/06/2019 1711  LYMPHSABS 2.3 10/06/2019 1711   MONOABS 0.6 12/10/2013 1426   EOSABS 0.3 10/06/2019 1711   BASOSABS 0.1 10/06/2019 1711    No results found for: POCLITH, LITHIUM   No results found for: PHENYTOIN, PHENOBARB, VALPROATE, CBMZ    06/10/19 Normal D 80, B12 >2000, normal TSH, CBC, CMP  .res Assessment: Plan:    Evoleth was seen today for follow-up, depression, anxiety, stress, sleeping problem and fatigue.  Diagnoses and all orders for this visit:  Major depressive disorder, recurrent episode, moderate (HCC) -     sertraline  (ZOLOFT ) 25 MG tablet; Take 1.5 tablets (37.5 mg total) by mouth daily.  Generalized anxiety disorder -     sertraline  (ZOLOFT ) 25 MG tablet; Take 1.5 tablets (37.5 mg total) by mouth daily.  Insomnia due to other mental disorder -     traZODone  (DESYREL ) 100 MG tablet; Take 1 tablet (100 mg total) by mouth at bedtime. -     clonazePAM  (KLONOPIN ) 1 MG tablet; Take 1 tablet (1 mg total) by mouth at bedtime.  Attention deficit hyperactivity disorder (ADHD), predominantly inattentive type  Nightmares  Fibromyalgia  Restless legs syndrome (RLS) -     rOPINIRole (REQUIP) 0.5 MG tablet; Take 1-2 tablets (0.5-1 mg total) by mouth at bedtime.    Please see After Visit Summary for patient specific instructions.  30 min f face to face time with patient was spent on counseling and coordination of care. 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  Most of the improvement has come through therapy and control of the headaches with some benefit from the medication as well particularly the sertraline .   The benefits of the meds for sleep are clear as we tried to discontinue clonazepam  and she had worsening of not only sleep but also her other psychiatric symptoms.  Old chart reviewed in detail with  patient re: sleep and anxiety meds.  Failed multiple others  New after therapist Nathanel Aran retired.  Has been really helpful.    The clonazepam  is medically necessary.  She is failed multiple other sleep medications.  She is aware of sleep hygiene issues in detail. We discussed the short-term risks associated with benzodiazepines including sedation and increased fall risk among others.  Discussed long-term side effect risk including sedative SE, dependence, potential withdrawal symptoms, and the potential eventual dose-related risk of dementia.  But recent studies from 2020 dispute this association between benzodiazepines and dementia risk. Newer studies in 2020 do not support an association with dementia.   sHe is tolerating meds well.    Recently addressing B12 and low D  Consider try NAC for mild cognitive complaints. (NAC)  N-Acetylcysteine at 600 mg 2 daily to help with mild cognitive problems  Other option Aricept, .   Continue armodafinil  150 mg AM vs Sunosi and take consistently stopped lamotrigine  back to 100 DT wt gain    stopped Auvelity  bc she felt she developed SE including wt gain   continue sertraline  37.5 mg bc worse with less.  But consider reduction bc therapy helped and Auvelity  is helping. Continue trazodone  100 mg HS Continue clonazepam  1 mg HS and consider switch to Lunesta  Resume doxazosin  with NM at 2 mg nightly.  And resume ropinirole 0.5-1.0 mg pm RLS  Disc SE in detail and SSRI withdrawal sx.  Extensive med discussion.  See rheum for chronic fatigue.  Make sure D in 50s-60s  Discussed potential benefits, risks, and side effects of stimulants with  patient to include increased heart rate, palpitations, insomnia, increased anxiety, increased irritability, or decreased appetite.  Instructed patient to contact office if experiencing any significant tolerability issues. Trial Sunosi 37.5-75 mg AM   FU 2 mos  Lorene Macintosh MD, DFAPA  Future Appointments   Date Time Provider Department Center  03/06/2024  3:40 PM Karis Clunes, MD [facility] None  03/17/2024  2:30 PM Amundson JAYSON Nikki Bobie FORBES, MD [facility] None  06/27/2024 10:20 AM Dolphus Reiter, MD [facility] None  07/24/2024 11:20 AM Dolphus Reiter, MD [facility] None    No orders of the defined types were placed in this encounter.     -------------------------------

## 2024-03-04 DIAGNOSIS — K5730 Diverticulosis of large intestine without perforation or abscess without bleeding: Secondary | ICD-10-CM | POA: Diagnosis not present

## 2024-03-04 DIAGNOSIS — R3121 Asymptomatic microscopic hematuria: Secondary | ICD-10-CM | POA: Diagnosis not present

## 2024-03-04 DIAGNOSIS — K7689 Other specified diseases of liver: Secondary | ICD-10-CM | POA: Diagnosis not present

## 2024-03-04 DIAGNOSIS — R319 Hematuria, unspecified: Secondary | ICD-10-CM | POA: Diagnosis not present

## 2024-03-05 ENCOUNTER — Other Ambulatory Visit: Payer: Self-pay | Admitting: Psychiatry

## 2024-03-05 DIAGNOSIS — F5105 Insomnia due to other mental disorder: Secondary | ICD-10-CM

## 2024-03-05 DIAGNOSIS — M5416 Radiculopathy, lumbar region: Secondary | ICD-10-CM | POA: Diagnosis not present

## 2024-03-06 ENCOUNTER — Encounter (INDEPENDENT_AMBULATORY_CARE_PROVIDER_SITE_OTHER): Payer: Self-pay | Admitting: Otolaryngology

## 2024-03-06 ENCOUNTER — Ambulatory Visit (INDEPENDENT_AMBULATORY_CARE_PROVIDER_SITE_OTHER): Admitting: Otolaryngology

## 2024-03-06 VITALS — BP 102/71 | HR 75 | Temp 97.6°F | Ht 64.0 in | Wt 195.0 lb

## 2024-03-06 DIAGNOSIS — J342 Deviated nasal septum: Secondary | ICD-10-CM | POA: Diagnosis not present

## 2024-03-06 DIAGNOSIS — R0981 Nasal congestion: Secondary | ICD-10-CM

## 2024-03-06 DIAGNOSIS — J343 Hypertrophy of nasal turbinates: Secondary | ICD-10-CM

## 2024-03-06 DIAGNOSIS — J324 Chronic pansinusitis: Secondary | ICD-10-CM

## 2024-03-06 DIAGNOSIS — R519 Headache, unspecified: Secondary | ICD-10-CM | POA: Diagnosis not present

## 2024-03-06 DIAGNOSIS — R490 Dysphonia: Secondary | ICD-10-CM

## 2024-03-06 DIAGNOSIS — J310 Chronic rhinitis: Secondary | ICD-10-CM | POA: Diagnosis not present

## 2024-03-07 DIAGNOSIS — R490 Dysphonia: Secondary | ICD-10-CM | POA: Insufficient documentation

## 2024-03-07 NOTE — Progress Notes (Signed)
 Patient ID: Marisa Hamilton, female   DOB: 1961-03-24, 63 y.o.   MRN: [id number]  Follow up: Recurrent sinusitis, facial pain, chronic nasal congestion  Discussed the use of AI scribe software for clinical note transcription with the patient, who gave verbal consent to proceed.  History of Present Illness Marisa Hamilton is a 63 year old female with chronic rhinitis who presents today complaining of persistent facial pain and pressure.  She has been experiencing significant facial pain and pressure for the past month, describing it as 'so much pressure' inside her face. The symptoms have worsened over time and have not been alleviated by Flonase  or steroid treatments.  She also complains of occasional difficulty breathing through her nose due to congestion. The nasal congestion is not accompanied by yellow or green discharge.  She also reports hoarseness in her voice. Despite extensive testing by an allergist, no allergies were identified.  She denies any dysphagia, odynophagia, or dyspnea.  In the past, she was prescribed gabapentin  and Lyrica for nerve pain in her legs by her neurologist. She discontinued these medications due to significant weight gain of fifteen pounds in two weeks. The weight gain resolved after stopping the medication, and she no longer experiences nerve pain in her legs.  Exam: General: Communicates without difficulty, well nourished, no acute distress. Head: Normocephalic, no evidence injury, no tenderness, facial buttresses intact without stepoff. Face/sinus: No tenderness to palpation and percussion. Facial movement is normal and symmetric. Eyes: PERRL, EOMI. No scleral icterus, conjunctivae clear. Neuro: CN II exam reveals vision grossly intact.  No nystagmus at any point of gaze. Ears: Auricles well formed without lesions.  Ear canals are intact without mass or lesion.  No erythema or edema is appreciated.  The TMs are intact without fluid. Nose: External evaluation  reveals normal support and skin without lesions.  Dorsum is intact.  Anterior rhinoscopy reveals congested mucosa over anterior aspect of inferior turbinates and intact septum.  No purulence noted. Oral:  Oral cavity and oropharynx are intact, symmetric, without erythema or edema.  Mucosa is moist without lesions. Neck: Full range of motion without pain.  There is no significant lymphadenopathy.  No masses palpable.  Thyroid  bed within normal limits to palpation.  Parotid glands and submandibular glands equal bilaterally without mass.  Trachea is midline. Neuro:  CN 2-12 grossly intact.    Assessment and Plan Assessment & Plan Facial pain and pressure, etiology under evaluation Persistent facial pain and pressure without obvious infection. Symptoms do not correlate with physical findings. Differential diagnosis includes occult sinus disease and neurogenic pain such as migraine or trigeminal neuralgia.  -The physical exam findings are reviewed with the patient. - Order CT scan of the sinuses to evaluate for chronic rhinosinusitis. - If CT scan is negative, she may benefit from seeing a neurologist for treatment of her persistent facial pain.  Chronic rhinitis with nasal mucosal congestion Chronic rhinitis with nasal congestion and intermittent difficulty breathing. 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  Chronic hoarseness. - Refer to my colleague for laryngoscopy evaluation.

## 2024-03-12 DIAGNOSIS — M5416 Radiculopathy, lumbar region: Secondary | ICD-10-CM | POA: Diagnosis not present

## 2024-03-12 DIAGNOSIS — R399 Unspecified symptoms and signs involving the genitourinary system: Secondary | ICD-10-CM | POA: Diagnosis not present

## 2024-03-12 DIAGNOSIS — R3121 Asymptomatic microscopic hematuria: Secondary | ICD-10-CM | POA: Diagnosis not present

## 2024-03-12 DIAGNOSIS — N200 Calculus of kidney: Secondary | ICD-10-CM | POA: Diagnosis not present

## 2024-03-13 NOTE — Progress Notes (Deleted)
 63 y.o. G14P2002 Married Caucasian female here for annual exam.    PCP: Dyane Anthony RAMAN, FNP   Patient's last menstrual period was 07/29/2012.           Sexually active: Yes.    The current method of family planning is vasectomy and post menopausal status.    Menopausal hormone therapy:  n/a Exercising: {yes no:314532}  {types:19826} Smoker:  no  OB History  Gravida Para Term Preterm AB Living  2 2 2   2   SAB IAB Ectopic Multiple Live Births      2    # Outcome Date GA Lbr Len/2nd Weight Sex Type Anes PTL Lv  2 Term     M Vag-Spont   LIV  1 Term     M Vag-Spont   LIV     HEALTH MAINTENANCE: Last 2 paps:  07/12/21 neg HR HPV neg, 11/20/18 neg HPV neg History of abnormal Pap or positive HPV:  no Mammogram:   11/15/23 Breast Density cat B, BIRADS Cat 2 benign  Colonoscopy:  02/19/23 Bone Density:  02/23/23  Result  osteopenia    Immunization History  Administered Date(s) Administered   Fluzone Influenza virus vaccine,trivalent (IIV3), split virus 03/18/2014, 05/25/2018   Influenza Inj Mdck Quad Pf 05/25/2018   Influenza Split 02/12/2012   Influenza Whole 02/12/2010, 02/13/2011   Influenza,inj,Quad PF,6+ Mos 01/15/2019, 01/27/2020   Influenza,inj,quad, With Preservative 05/19/2015   Janssen (J&J) SARS-COV-2 Vaccination 08/01/2019, 04/27/2020   Tdap 03/25/2008, 11/20/2018   Zoster, Live 02/12/2012      reports that she has never smoked. She has never used smokeless tobacco. She reports that she does not currently use alcohol. She reports that she does not use drugs.  Past Medical History:  Diagnosis Date   Anxiety    Back pain    CAD (coronary artery disease), native coronary artery    50% prox LAD and <25% prox RCA>>noncalcified plaque by CTA 05/2023   CFS (chronic fatigue syndrome)    Concussion 03/17/2017   Constipation    Cystitis, interstitial    HAD BLADDER SX FOR SAME   Depression    Fainting spell    Food allergy     Foot pain    GERD (gastroesophageal  reflux disease)    Headache(784.0)    IBS (irritable bowel syndrome)    TAKES PROBIOTICS   Insomnia    Lactose intolerance    Leg pain    Migraine    MVP (mitral valve prolapse)    Mild posterior mitral valve prolapse with mild MR by echo 05/2023   Nausea    Neck pain    PAT (paroxysmal atrial tachycardia)    Noted on heart monitor 04/2023   PONV (postoperative nausea and vomiting)    PVC's (premature ventricular contractions)    PVC load 17.6% by heart monitor 04/2023   Recurrent upper respiratory infection (URI)    Restless leg syndrome    Stomach ache    Ulcerative colitis (HCC)    Varicose veins     Past Surgical History:  Procedure Laterality Date   ABDOMINOPLASTY  08/2012   BLADDER SURGERY     FOR IC   BREAST BIOPSY Left 02/26/2012   negative   BREAST REDUCTION SURGERY  10/10/2011   Procedure: MAMMARY REDUCTION  (BREAST);  Surgeon: Ronal DELENA Mage, MD;  Location: [facility];  Service: Plastics;  Laterality: Bilateral;   COLONOSCOPY  10/22/2015   Dr.Mann   ESOPHAGOGASTRODUODENOSCOPY ENDOSCOPY  LASIK     NASAL SEPTUM SURGERY     REDUCTION MAMMAPLASTY Bilateral    VASCULAR SURGERY     VARICOSE VEINS   WRIST ARTHROSCOPY WITH [facility]PEL [facility] ARTHROPLASTY Left 10/20/2022    Current Outpatient Medications  Medication Sig Dispense Refill   Armodafinil  150 MG tablet TAKE 1 TABLET BY MOUTH EVERY DAY 30 tablet 1   aspirin EC 81 MG tablet Take 81 mg by mouth daily. Swallow whole.     Atogepant (QULIPTA) 60 MG TABS Take 60 mg by mouth daily.     celecoxib  (CELEBREX ) 200 MG capsule Take 1 capsule (200 mg total) by mouth 2 (two) times daily. (Patient not taking: Reported on 03/06/2024) 20 capsule 0   chlorproMAZINE (THORAZINE) 25 MG tablet PRN     clonazePAM  (KLONOPIN ) 1 MG tablet Take 1 tablet (1 mg total) by mouth at bedtime. 30 tablet 2   cyanocobalamin  (VITAMIN B12) 1000 MCG tablet Take 1,000 mcg by mouth daily.     famotidine  (PEPCID ) 20 MG  tablet Take 20 mg by mouth daily as needed for heartburn or indigestion. Pt taking in AM and PM     fluticasone  (FLONASE ) 50 MCG/ACT nasal spray Place 2 sprays into both nostrils daily. 11.1 mL 2   ketorolac  (TORADOL ) 60 MG/2ML SOLN injection Inject 60 mg into the muscle as needed (migraines).      Lasmiditan Succinate (REYVOW) 100 MG TABS Take 100 mg by mouth as needed.     methylPREDNISolone  (MEDROL  DOSEPAK) 4 MG TBPK tablet Use as directed (Patient not taking: Reported on 03/06/2024) 21 tablet 0   metoprolol  succinate (TOPROL  XL) 25 MG 24 hr tablet Take 1 tablet (25 mg total) by mouth daily. 90 tablet 3   omeprazole  (PRILOSEC) 40 MG capsule Take 1 capsule (40 mg total) by mouth daily. 90 capsule 3   ondansetron  (ZOFRAN ) 4 MG tablet TAKE 1 TABLET BY MOUTH EVERY 8 HOURS AS NEEDED FOR NAUSEA AND VOMITING 20 tablet 0   oxyCODONE  (ROXICODONE ) 5 MG immediate release tablet Take 0.5-1 tablets (2.5-5 mg total) by mouth every 6 (six) hours as needed for severe pain (pain score 7-10). 10 tablet 0   predniSONE  (STERAPRED UNI-PAK 21 TAB) 10 MG (21) TBPK tablet Per instructions (6,5,4,3,2,1) (Patient not taking: Reported on 03/06/2024) 21 tablet 0   rizatriptan (MAXALT) 10 MG tablet Take 10 mg by mouth as needed for migraine.     rOPINIRole (REQUIP) 0.5 MG tablet Take 1-2 tablets (0.5-1 mg total) by mouth at bedtime. 60 tablet 0   rosuvastatin  (CRESTOR ) 10 MG tablet Take 1 tablet (10 mg total) by mouth daily. 90 tablet 3   sertraline  (ZOLOFT ) 25 MG tablet Take 1.5 tablets (37.5 mg total) by mouth daily. 135 tablet 0   simethicone  (MYLICON) 125 MG chewable tablet Chew 125 mg by mouth every 6 (six) hours as needed for flatulence.     Sodium Sulfate-Mag Sulfate-KCl (SUTAB ) 1479-225-188 MG TABS Use as directed for colonoscopy. MANUFACTURER CODES!! BIN: [id number] PCN: CN GROUP: [id number] MEMBER ID: [id number];MLW AS SECONDARY INSURANCE ;NO PRIOR AUTHORIZATION 24 tablet 0   tiZANidine  (ZANAFLEX ) 4 MG tablet Take 1  tablet (4 mg total) by mouth at bedtime. 30 tablet 1   topiramate  (TOPAMAX ) 50 MG tablet Take 1 tablet (50 mg total) by mouth 2 (two) times daily. (Patient not taking: Reported on 03/06/2024) 60 tablet 0   traZODone  (DESYREL ) 100 MG tablet Take 1 tablet (100 mg total) by mouth at bedtime. 90 tablet 0   VITAMIN  D PO Take 4,000 Units by mouth daily.     Wheat Dextrin (BENEFIBER PO) Take by mouth.     No current facility-administered medications for this visit.    ALLERGIES: Selenium dioxide [selenium], Gluten meal, Milk (cow), Nsaids, Phentermine, Amitriptyline hcl, Bacid, Meloxicam, and Milk-related compounds  Family History  Problem Relation Age of Onset   Osteoporosis Mother    Atrial fibrillation Mother    Heart disease Mother    Stroke Mother    Depression Mother    Anxiety disorder Mother    Obesity Mother    Angioedema Father    Diabetes Father    Hypertension Father    Depression Father    Obesity Father    Migraines Brother        CHRONIC   Breast cancer Maternal Grandmother    Cancer Maternal Grandmother        ovarian   Diabetes Paternal Grandmother    Colon cancer Neg Hx    Esophageal cancer Neg Hx    Pancreatic cancer Neg Hx    Stomach cancer Neg Hx    Liver disease Neg Hx    Rectal cancer Neg Hx    Colon polyps Neg Hx     Review of Systems  PHYSICAL EXAM:  LMP 07/29/2012 Comment: spotting that week    General appearance: alert, cooperative and appears stated age Head: normocephalic, without obvious abnormality, atraumatic Neck: no adenopathy, supple, symmetrical, trachea midline and thyroid  normal to inspection and palpation Lungs: clear to auscultation bilaterally Breasts: normal appearance, no masses or tenderness, No nipple retraction or dimpling, No nipple discharge or bleeding, No axillary adenopathy Heart: regular rate and rhythm Abdomen: soft, non-tender; no masses, no organomegaly Extremities: extremities normal, atraumatic, no cyanosis or  edema Skin: skin color, texture, turgor normal. No rashes or lesions Lymph nodes: cervical, supraclavicular, and axillary nodes normal. Neurologic: grossly normal  Pelvic: External genitalia:  no lesions              No abnormal inguinal nodes palpated.              Urethra:  normal appearing urethra with no masses, tenderness or lesions              Bartholins and Skenes: normal                 Vagina: normal appearing vagina with normal color and discharge, no lesions              Cervix: no lesions              Pap taken: {yes no:314532} Bimanual Exam:  Uterus:  normal size, contour, position, consistency, mobility, non-tender              Adnexa: no mass, fullness, tenderness              Rectal exam: {yes no:314532}.  Confirms.              Anus:  normal sphincter tone, no lesions  Chaperone was present for exam:  {BSCHAPERONE:31226::Emily F, CMA}  ASSESSMENT: Well woman visit with gynecologic exam.  PHQ-2-9: ***  ***  PLAN: Mammogram screening discussed. Self breast awareness reviewed. Pap and HRV collected:  {yes no:314532} Guidelines for Calcium , Vitamin D , regular exercise program including cardiovascular and weight bearing exercise. Medication refills:  *** {LABS (Optional):23779} Follow up:  ***    Additional counseling given.  {yes [id number]. ***  total time was spent for this patient encounter,  including preparation, face-to-face counseling with the patient, coordination of care, and documentation of the encounter in addition to doing the well woman visit with gynecologic exam.

## 2024-03-16 DIAGNOSIS — R0981 Nasal congestion: Secondary | ICD-10-CM | POA: Diagnosis not present

## 2024-03-16 DIAGNOSIS — B349 Viral infection, unspecified: Secondary | ICD-10-CM | POA: Diagnosis not present

## 2024-03-16 DIAGNOSIS — R051 Acute cough: Secondary | ICD-10-CM | POA: Diagnosis not present

## 2024-03-16 DIAGNOSIS — J029 Acute pharyngitis, unspecified: Secondary | ICD-10-CM | POA: Diagnosis not present

## 2024-03-16 DIAGNOSIS — R5383 Other fatigue: Secondary | ICD-10-CM | POA: Diagnosis not present

## 2024-03-16 DIAGNOSIS — Z03818 Encounter for observation for suspected exposure to other biological agents ruled out: Secondary | ICD-10-CM | POA: Diagnosis not present

## 2024-03-17 ENCOUNTER — Other Ambulatory Visit: Payer: Self-pay | Admitting: Cardiology

## 2024-03-17 ENCOUNTER — Ambulatory Visit: Payer: BC Managed Care – PPO | Admitting: Obstetrics and Gynecology

## 2024-03-20 ENCOUNTER — Institutional Professional Consult (permissible substitution) (INDEPENDENT_AMBULATORY_CARE_PROVIDER_SITE_OTHER)

## 2024-03-26 DIAGNOSIS — J209 Acute bronchitis, unspecified: Secondary | ICD-10-CM | POA: Diagnosis not present

## 2024-03-26 DIAGNOSIS — J0110 Acute frontal sinusitis, unspecified: Secondary | ICD-10-CM | POA: Diagnosis not present

## 2024-03-31 ENCOUNTER — Institutional Professional Consult (permissible substitution) (INDEPENDENT_AMBULATORY_CARE_PROVIDER_SITE_OTHER)

## 2024-04-01 DIAGNOSIS — R0981 Nasal congestion: Secondary | ICD-10-CM | POA: Diagnosis not present

## 2024-04-01 DIAGNOSIS — R0602 Shortness of breath: Secondary | ICD-10-CM | POA: Diagnosis not present

## 2024-04-01 DIAGNOSIS — R058 Other specified cough: Secondary | ICD-10-CM | POA: Diagnosis not present

## 2024-04-01 DIAGNOSIS — R0989 Other specified symptoms and signs involving the circulatory and respiratory systems: Secondary | ICD-10-CM | POA: Diagnosis not present

## 2024-04-03 ENCOUNTER — Ambulatory Visit (INDEPENDENT_AMBULATORY_CARE_PROVIDER_SITE_OTHER): Admitting: Otolaryngology

## 2024-04-08 ENCOUNTER — Telehealth (INDEPENDENT_AMBULATORY_CARE_PROVIDER_SITE_OTHER): Payer: Self-pay | Admitting: Otolaryngology

## 2024-04-08 NOTE — Telephone Encounter (Signed)
 Left patient a voice mail regarding to her f/u appointment on 04/11/24. She need to cancel/ reschedule her appointment  with Dr. Karis after her sinus CT scan.

## 2024-04-09 ENCOUNTER — Telehealth (INDEPENDENT_AMBULATORY_CARE_PROVIDER_SITE_OTHER): Payer: Self-pay

## 2024-04-09 NOTE — Telephone Encounter (Signed)
 Patient called asking about imagine I explained that she has not had the imagine done I gave the patient the phone number for radiology to call and schedule her appointment patient stated she would get the scans done and call us  to reschedule her appointment.

## 2024-04-11 ENCOUNTER — Ambulatory Visit (INDEPENDENT_AMBULATORY_CARE_PROVIDER_SITE_OTHER): Admitting: Otolaryngology

## 2024-04-14 DIAGNOSIS — M5416 Radiculopathy, lumbar region: Secondary | ICD-10-CM | POA: Diagnosis not present

## 2024-04-15 ENCOUNTER — Other Ambulatory Visit: Payer: Self-pay

## 2024-04-15 DIAGNOSIS — I341 Nonrheumatic mitral (valve) prolapse: Secondary | ICD-10-CM

## 2024-04-21 DIAGNOSIS — M5416 Radiculopathy, lumbar region: Secondary | ICD-10-CM | POA: Diagnosis not present

## 2024-04-29 ENCOUNTER — Ambulatory Visit (HOSPITAL_BASED_OUTPATIENT_CLINIC_OR_DEPARTMENT_OTHER)

## 2024-05-13 ENCOUNTER — Ambulatory Visit (HOSPITAL_BASED_OUTPATIENT_CLINIC_OR_DEPARTMENT_OTHER)
Admission: RE | Admit: 2024-05-13 | Discharge: 2024-05-13 | Disposition: A | Discharge status: Home / Self Care | Source: Ambulatory Visit | Attending: Otolaryngology | Admitting: Otolaryngology

## 2024-05-13 DIAGNOSIS — J324 Chronic pansinusitis: Secondary | ICD-10-CM | POA: Insufficient documentation

## 2024-05-13 DIAGNOSIS — R519 Headache, unspecified: Secondary | ICD-10-CM | POA: Insufficient documentation

## 2024-05-21 ENCOUNTER — Telehealth: Payer: Self-pay

## 2024-05-21 NOTE — Telephone Encounter (Signed)
 Attempted to reach patient concerning colonoscopy recall; unable to speak with patient;  left message and number to the office for patient to call back and schedule appts;

## 2024-05-22 ENCOUNTER — Other Ambulatory Visit: Payer: Self-pay | Admitting: Cardiology

## 2024-05-27 ENCOUNTER — Encounter: Payer: Self-pay | Admitting: Psychiatry

## 2024-05-27 ENCOUNTER — Ambulatory Visit: Admitting: Psychiatry

## 2024-05-27 DIAGNOSIS — F411 Generalized anxiety disorder: Secondary | ICD-10-CM

## 2024-05-27 DIAGNOSIS — F5105 Insomnia due to other mental disorder: Secondary | ICD-10-CM | POA: Diagnosis not present

## 2024-05-27 DIAGNOSIS — F99 Mental disorder, not otherwise specified: Secondary | ICD-10-CM | POA: Diagnosis not present

## 2024-05-27 DIAGNOSIS — F515 Nightmare disorder: Secondary | ICD-10-CM

## 2024-05-27 DIAGNOSIS — M797 Fibromyalgia: Secondary | ICD-10-CM | POA: Diagnosis not present

## 2024-05-27 DIAGNOSIS — F900 Attention-deficit hyperactivity disorder, predominantly inattentive type: Secondary | ICD-10-CM

## 2024-05-27 DIAGNOSIS — G2581 Restless legs syndrome: Secondary | ICD-10-CM

## 2024-05-27 DIAGNOSIS — F331 Major depressive disorder, recurrent, moderate: Secondary | ICD-10-CM | POA: Diagnosis not present

## 2024-05-27 MED ORDER — DOXAZOSIN MESYLATE 4 MG PO TABS: 4.0000 mg | ORAL_TABLET | Freq: Every day | ORAL | 0 refills | Status: DC

## 2024-05-27 MED ORDER — CLONAZEPAM 1 MG PO TABS: 1.0000 mg | ORAL_TABLET | Freq: Every day | ORAL | 2 refills | Status: AC

## 2024-05-27 MED ORDER — TRAZODONE HCL 100 MG PO TABS: 100.0000 mg | ORAL_TABLET | Freq: Every day | ORAL | 1 refills | Status: AC

## 2024-05-27 NOTE — Progress Notes (Signed)
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 (07/09/2023)   Received from [facility] Utilities    Threatened with loss of utilities: No  Health Literacy: Not on file    Past Medical History, Surgical history, Social history, and Family history were reviewed and updated as appropriate.   Please see review of systems for further details on the patient's review from today.   Objective:   Physical Exam:  LMP 07/29/2012 Comment: spotting that week  Physical Exam Constitutional:      General: She is not in acute distress.    Appearance: She is well-developed.  Musculoskeletal:        General: No deformity.  Neurological:     Mental Status: She is alert and oriented to person, place, and time.     Motor: No atrophy.     Coordination: Coordination normal.     Gait: Gait normal.  Psychiatric:        Attention and Perception: She is attentive.        Mood and Affect: Mood is anxious and depressed. Affect is not labile, blunt, tearful or inappropriate.        Speech: Speech normal.        Behavior: Behavior normal.        Thought Content: Thought content normal. Thought content is not delusional. Thought content does not include homicidal or suicidal ideation.  Thought content does not include suicidal plan.  Cognition and Memory: Cognition normal.        Judgment: Judgment normal.     Comments: Insight intact. No auditory or visual hallucinations. No delusions.  DT fatigue    Lab Review:     Component Value Date/Time   NA 141 10/22/2023 0359   NA 140 03/23/2023 1510   K 4.1 10/22/2023 0359   CL 106 10/22/2023 0359   CO2 24 10/22/2023 0359   GLUCOSE 118 (H) 10/22/2023 0359   BUN 21 10/22/2023 0359   BUN 15 03/23/2023 1510   CREATININE 0.94 10/22/2023 0359   CREATININE 0.87 08/04/2013 1100   CALCIUM  9.1 10/22/2023 0359   PROT 6.9 10/22/2023 0359   PROT 6.7 10/06/2019 1711   ALBUMIN 4.0 10/22/2023 0359   ALBUMIN 4.2 10/06/2019 1711   AST 18 10/22/2023 0359   ALT 16 10/22/2023 0359   ALKPHOS 75 10/22/2023 0359   BILITOT <0.2 10/22/2023 0359   BILITOT <0.2 10/06/2019 1711   GFRNONAA >60 10/22/2023 0359   GFRAA 104 10/06/2019 1711       Component Value Date/Time   WBC 9.3 10/22/2023 0359   RBC 4.66 10/22/2023 0359   HGB 13.3 10/22/2023 0359   HGB 14.0 10/06/2019 1711   HGB 13.8 07/30/2013 1333   HCT 41.0 10/22/2023 0359   HCT 41.4 10/06/2019 1711   PLT 227 10/22/2023 0359   PLT 265 10/06/2019 1711   MCV 88.0 10/22/2023 0359   MCV 89 10/06/2019 1711   MCH 28.5 10/22/2023 0359   MCHC 32.4 10/22/2023 0359   RDW 14.4 10/22/2023 0359   RDW 13.4 10/06/2019 1711   LYMPHSABS 2.3 10/06/2019 1711   MONOABS 0.6 12/10/2013 1426   EOSABS 0.3 10/06/2019 1711   BASOSABS 0.1 10/06/2019 1711    No results found for: POCLITH, LITHIUM   No results found for: PHENYTOIN, PHENOBARB, VALPROATE, CBMZ    06/10/19 Normal D 80, B12 >2000, normal TSH, CBC, CMP  .res Assessment: Plan:    Lakeshia was seen today for follow-up.  Diagnoses and all orders for this visit:  Major depressive disorder, recurrent episode, moderate (HCC)  Generalized anxiety disorder  Attention deficit hyperactivity disorder (ADHD),  predominantly inattentive type  Nightmares -     doxazosin  (CARDURA ) 4 MG tablet; Take 1 tablet (4 mg total) by mouth daily.  Insomnia due to other mental disorder -     traZODone  (DESYREL ) 100 MG tablet; Take 1 tablet (100 mg total) by mouth at bedtime. -     clonazePAM  (KLONOPIN ) 1 MG tablet; Take 1 tablet (1 mg total) by mouth at bedtime.  Restless legs syndrome (RLS)  Fibromyalgia     Please see After Visit Summary for patient specific instructions.  30 min f face to face time with patiente. 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  Most of the improvement has come through therapy and control of the headaches with some benefit from the medication as well particularly the sertraline .   The benefits of the meds for sleep are clear as we tried to discontinue clonazepam  and she had worsening of not only sleep but also her other psychiatric symptoms.  Old chart reviewed in detail with patient re: sleep and anxiety meds.  Failed multiple others  New after therapist Nathanel Aran retired.  Has been really helpful.    The clonazepam  is medically necessary.  She is failed multiple other sleep medications.  She is aware of sleep hygiene issues in detail. We discussed the  short-term risks associated with benzodiazepines including sedation and increased fall risk among others.  Discussed long-term side effect risk including sedative SE, dependence, potential withdrawal symptoms, and the potential eventual dose-related risk of dementia.  But recent studies from 2020 dispute this association between benzodiazepines and dementia risk. Newer studies in 2020 do not support an association with dementia.   sHe is tolerating meds well.    Recently addressing B12 and low D  Consider try NAC for mild cognitive complaints. (NAC)  N-Acetylcysteine at 600 mg 2 daily to help with mild cognitive problems  Other option Aricept, .   Continue armodafinil  150  mg AM helps  Per her request wean sertraline  by 12.5 mg daily per month.  She feels like it might contribute to wt gain.  Continue trazodone  100 mg HS Continue clonazepam  1 mg HS and consider switch to Lunesta  prn doxazosin  with NM at 2 mg nightly.  ropinirole  0.5-1.0 mg pm RLS  Disc SE in detail and SSRI withdrawal sx.  Extensive med discussion.  See rheum for chronic fatigue.  Make sure D in 50s-60s  FU 2 mos  Lorene Macintosh MD, DFAPA  Future Appointments  Date Time Provider Department Center  06/10/2024  1:15 PM [facility] 1 [facility]  06/19/2024  2:40 PM Karis Clunes, MD [facility] None  06/27/2024 10:20 AM Dolphus Reiter, MD [facility] None  07/24/2024 11:20 AM Dolphus Reiter, MD [facility] None  08/18/2024  2:30 PM Amundson JAYSON Cary, Bobie BRAVO, MD [facility] None    No orders of the defined types were placed in this encounter.     -------------------------------

## 2024-05-29 ENCOUNTER — Ambulatory Visit (INDEPENDENT_AMBULATORY_CARE_PROVIDER_SITE_OTHER): Admitting: Otolaryngology

## 2024-06-03 NOTE — Progress Notes (Signed)
 "  Office Visit Note  Patient: Marisa Hamilton             Date of Birth: [birth year]           MRN: [id number]             PCP: Dyane Anthony RAMAN, FNP Referring: Dyane Anthony RAMAN, FNP Visit Date: 06/06/2024 Occupation: Data Unavailable  Subjective:  Increased fatigue  History of Present Illness: Marisa Hamilton is a 64 y.o. female seen for the evaluation of increased fatigue and polyarthralgia.  According the patient her symptoms started in her mid 30s with increased fatigue.  She says the fatigue gradually got worse and has been much worse over the last 3 years.  She states the fatigue got to the point that she has been having difficulty doing routine activities.  She was placed on was armodafinil  about an year ago.  She has noticed improvement to some extent with the fatigue.  She does not have restful sleep.  She has had lower back pain for approximately 20 years.  She goes to [facility].  She has been under care of Dr. Gifford.  She gets injections in her lumbar spine.  She has been also going to physical therapy for lower back pain.  She states she has had 2 falls last year due to difficulty lifting her legs when she was climbing stairs.  She denies any history of joint swelling.  She denies discomfort in any other joints besides her lower back.  There is no history of muscle pain. She gives history of mild dry mouth and dry eye symptoms.  There is no history of malar rash, oral ulcers, Raynaud's phenomenon, lymphadenopathy or inflammatory arthritis. There is no family history of autoimmune disease. She is right-handed, retired garment/textile technologist.  She enjoys reading and doing puzzles and playing with her grandchildren.  She states she has to walk on a regular basis and needs to go for Pilates.  She has been feeling very tired to walk now. She is accompanied by her husband Chip today.  She is gravida 2, para 2.  There is no history of preeclampsia or DVTs.  She does not drink alcohol and  has been a non-smoker.     Activities of Daily Living:  Patient reports morning stiffness for 4 hours.   Patient Denies nocturnal pain.  Difficulty dressing/grooming: Reports Difficulty climbing stairs: Reports Difficulty getting out of chair: Reports Difficulty using hands for taps, buttons, cutlery, and/or writing: Denies  Review of Systems  Constitutional:  Positive for fatigue.  HENT:  Positive for mouth dryness. Negative for mouth sores.   Eyes:  Positive for dryness.  Respiratory:  Positive for shortness of breath.   Cardiovascular:  Negative for chest pain and palpitations.  Gastrointestinal:  Positive for constipation. Negative for blood in stool and diarrhea.  Endocrine: Negative for increased urination.  Genitourinary:  Negative for involuntary urination.  Musculoskeletal:  Positive for gait problem and morning stiffness. Negative for joint pain, joint pain, joint swelling, myalgias, muscle weakness, muscle tenderness and myalgias.  Skin:  Positive for sensitivity to sunlight. Negative for color change, rash and hair loss.  Allergic/Immunologic: Negative for susceptible to infections.  Neurological:  Positive for headaches. Negative for dizziness.  Hematological:  Negative for swollen glands.  Psychiatric/Behavioral:  Positive for depressed mood and sleep disturbance. The patient is nervous/anxious.     PMFS History:  Patient Active Problem List   Diagnosis Date Noted   CAD (coronary  artery disease), native coronary artery    Dysphonia 03/07/2024   Chronic rhinitis 02/05/2024   Deviated nasal septum 02/05/2024   Hypertrophy of nasal turbinates 02/05/2024   Facial pain 02/05/2024   Chronic sinusitis 09/26/2023   Acute bronchitis 06/27/2023   PVC's (premature ventricular contractions)    PAT (paroxysmal atrial tachycardia)    Pulmonary nodules 06/22/2021   Multiple thyroid  nodules 06/22/2021   Dyspnea 06/22/2021   Chronic fatigue syndrome 08/05/2020   Essential  hypertension 08/05/2020   Heart disease 08/05/2020   Mild major depression, single episode 08/05/2020   Schatzki's ring 08/05/2020   Mitral valve prolapse 08/05/2020   Class 1 obesity with serious comorbidity and body mass index (BMI) of 31.0 to 31.9 in adult 12/29/2019   Hepatic cyst, left lobe, stable, US  2018 12/12/2019   Emotional eating behaviors, followed by Dr. Sharron (Bariatric Psychology) 12/03/2019   Other chronic pain 12/03/2019   Polypharmacy 12/03/2019   Prediabetes, highest A1c 5.7 on1/23/21, improved with diet changes 12/03/2019   Vitamin D  deficiency, AT GOAL, taking OTC vitamin D , 10/06/19 = 65.9 12/03/2019   Moderate episode of recurrent major depressive disorder (HCC) 12/03/2019   Attention deficit hyperactivity disorder (ADHD), predominantly inattentive type 12/03/2019   Insomnia due to other mental disorder, followed by Psych, Rx Trazodone  q hs 12/03/2019   Hypertriglyceridemia 12/03/2019   Fibromyalgia 12/03/2019   Chronic constipation, treatment includes: fiber gummies, colace, miralax, Symproic 12/03/2019   IC (interstitial cystitis) 12/03/2019   Grade I diastolic dysfunction 12/03/2019   Chronic migraine 12/03/2019   Diverticulosis of colon 12/03/2019   GERD (gastroesophageal reflux disease) 12/03/2019   Tubular adenoma of colon, colonoscopy 2017, with repeat due 2022, Dr. Kristie 12/03/2019   Ulcerative colitis (HCC) 12/03/2019   Low back pain 10/07/2019   Pain in joint, ankle and foot 10/07/2019   GAD (generalized anxiety disorder), Rx Zoloft  25 mg q hs, followed by Psych 03/02/2018   PTSD (post-traumatic stress disorder), followed by Psych 03/02/2018   Chronic venous insufficiency 10/28/2013   Varicose veins of bilateral lower extremities with other complications 10/28/2013   PLMD (periodic limb movement disorder), NPSG 2011 12/11/2009   Other extrapyramidal disease and abnormal movement disorder 12/11/2009   Allergic rhinitis 10/05/2009    Past Medical  History:  Diagnosis Date   Anxiety    Back pain    CAD (coronary artery disease), native coronary artery    50% prox LAD and <25% prox RCA>>noncalcified plaque by CTA 05/2023   CFS (chronic fatigue syndrome)    Concussion 03/17/2017   Constipation    Cystitis, interstitial    HAD BLADDER SX FOR SAME   Depression    Fainting spell    Food allergy     Foot pain    GERD (gastroesophageal reflux disease)    Headache(784.0)    IBS (irritable bowel syndrome)    TAKES PROBIOTICS   Insomnia    Lactose intolerance    Leg pain    Migraine    MVP (mitral valve prolapse)    Mild posterior mitral valve prolapse with mild MR by echo 05/2023   Nausea    Neck pain    PAT (paroxysmal atrial tachycardia)    Noted on heart monitor 04/2023   PONV (postoperative nausea and vomiting)    PVC's (premature ventricular contractions)    PVC load 17.6% by heart monitor 04/2023   Recurrent upper respiratory infection (URI)    Restless leg syndrome    Stomach ache    Ulcerative colitis (HCC)  Varicose veins     Family History  Problem Relation Age of Onset   Osteoporosis Mother    Atrial fibrillation Mother    Heart disease Mother    Stroke Mother    Depression Mother    Anxiety disorder Mother    Angioedema Father    Diabetes Father    Hypertension Father    Depression Father    Obesity Father    Obesity Brother    Migraines Brother        CHRONIC   Breast cancer Maternal Grandmother    Cancer Maternal Grandmother        ovarian   Diabetes Paternal Grandmother    Colon cancer Neg Hx    Esophageal cancer Neg Hx    Pancreatic cancer Neg Hx    Stomach cancer Neg Hx    Liver disease Neg Hx    Rectal cancer Neg Hx    Colon polyps Neg Hx    Past Surgical History:  Procedure Laterality Date   ABDOMINOPLASTY  08/2012   BLADDER SURGERY     FOR IC   BREAST BIOPSY Left 02/26/2012   negative   BREAST REDUCTION SURGERY  10/10/2011   Procedure: MAMMARY REDUCTION  (BREAST);  Surgeon:  Ronal DELENA Mage, MD;  Location: [facility];  Service: Plastics;  Laterality: Bilateral;   COLONOSCOPY  10/22/2015   Dr.Mann   ESOPHAGOGASTRODUODENOSCOPY ENDOSCOPY     LASIK     NASAL SEPTUM SURGERY     REDUCTION MAMMAPLASTY Bilateral    VASCULAR SURGERY     VARICOSE VEINS   WRIST ARTHROSCOPY WITH CARPOMETACARPEL [facility]) ARTHROPLASTY Left 10/20/2022   Social History[1] Social History   Social History Narrative   Not on file     Immunization History  Administered Date(s) Administered   Fluzone Influenza virus vaccine,trivalent (IIV3), split virus 03/18/2014, 05/25/2018   Influenza Inj Mdck Quad Pf 05/25/2018   Influenza Split 02/12/2012   Influenza Whole 02/12/2010, 02/13/2011   Influenza,inj,Quad PF,6+ Mos 01/15/2019, 01/27/2020   Influenza,inj,quad, With Preservative 05/19/2015   Janssen (J&J) SARS-COV-2 Vaccination 08/01/2019, 04/27/2020   Tdap 03/25/2008, 11/20/2018   Zoster, Live 02/12/2012     Objective: Vital Signs: BP 109/72 (BP Location: Right Arm, Patient Position: Sitting, Cuff Size: Normal)   Pulse 60   Temp (!) 97.4 F (36.3 C)   Resp 15   Ht 5' 5 (1.651 m)   Wt 191 lb 3.2 oz (86.7 kg)   LMP 07/29/2012 Comment: spotting that week  BMI 31.82 kg/m    Physical Exam Vitals and nursing note reviewed.  Constitutional:      Appearance: She is well-developed.  HENT:     Head: Normocephalic and atraumatic.  Eyes:     Conjunctiva/sclera: Conjunctivae normal.  Cardiovascular:     Rate and Rhythm: Normal rate and regular rhythm.     Heart sounds: Normal heart sounds.  Pulmonary:     Effort: Pulmonary effort is normal.     Breath sounds: Normal breath sounds.  Abdominal:     General: Bowel sounds are normal.     Palpations: Abdomen is soft.  Musculoskeletal:     Cervical back: Normal range of motion.  Lymphadenopathy:     Cervical: No cervical adenopathy.  Skin:    General: Skin is warm and dry.     Capillary Refill: Capillary refill  takes less than 2 seconds.  Neurological:     Mental Status: She is alert and oriented to person, place, and time.  Psychiatric:        Behavior: Behavior normal.      Musculoskeletal Exam: Cervical, thoracic and lumbar spine were in good range of motion.  There was no SI joint tenderness.  Shoulder joints, elbow joints, wrist joints, MCPs, PIPs and DIPs were in good range of motion with no synovitis.  Hip joints and knee joints were in good range of motion without any warmth swelling or effusion.  There was no tenderness over ankles or MTPs.   CDAI Exam: CDAI Score: -- Patient Global: --; Provider Global: -- Swollen: --; Tender: -- Joint Exam 06/06/2024   No joint exam has been documented for this visit   There is currently no information documented on the homunculus. Go to the Rheumatology activity and complete the homunculus joint exam.  Investigation: No additional findings.  Imaging: CT SINUS WO CONTRAST Result Date: 05/25/2024 EXAM: CT Sinus without contrast 05/13/2024 03:53:56 PM TECHNIQUE: CT sinuses without contrast. Dose reduction techniques were achieved by using automated exposure control and/or adjustment of mA and/or kV according to patient size and/or use of iterative reconstruction technique. COMPARISON: None available CLINICAL HISTORY: Chronic sinusitis FINDINGS: FRONTAL: Clear. Patent frontoethmoidal recesses. MAXILLARY: Minimal right maxillary sinus mucosal thickening. The left maxillary sinus is clear. Patent ostiomeatal complexes. ETHMOID: Clear. The ethmoid roofs are symmetric. No pneumatization superior to the anterior ethmoid notches. The lamina papyracea are intact. The optic nerve canals are intact. SPHENOID: Moderate mucosal thickening of the right sphenoid sinus with occlusion of the right sphenoethmoidal recess. The left sphenoid sinus is clear. The carotid canals are intact. NASAL CAVITY: The nasal septum is mildly deviated to the right. Patent. OTHER: The  mastoid air cells and middle ear cavities are well aerated. No acute soft tissue abnormality. IMPRESSION: 1. Moderate mucosal thickening of the right sphenoid sinus with occlusion of the right sphenoethmoidal recess. 2. Minimal right maxillary sinus mucosal thickening. 3. Mild rightward deviation of the nasal septum. Electronically signed by: Franky Stanford MD MD 05/25/2024 11:47 PM EST RP Workstation: [id number]    Recent Labs: Lab Results  Component Value Date   WBC 9.3 10/22/2023   HGB 13.3 10/22/2023   PLT 227 10/22/2023   NA 141 10/22/2023   K 4.1 10/22/2023   CL 106 10/22/2023   CO2 24 10/22/2023   GLUCOSE 118 (H) 10/22/2023   BUN 21 10/22/2023   CREATININE 0.94 10/22/2023   BILITOT <0.2 10/22/2023   ALKPHOS 75 10/22/2023   AST 18 10/22/2023   ALT 16 10/22/2023   PROT 6.9 10/22/2023   ALBUMIN 4.0 10/22/2023   CALCIUM  9.1 10/22/2023   GFRAA 104 10/06/2019    Speciality Comments: No specialty comments available.  Procedures:  No procedures performed Allergies: Other, Phentermine, Selenium dioxide [selenium], Amitriptyline hcl, Gluten meal, Meloxicam, Milk (cow), Nsaids, Amitriptyline, Bacid, Milk-related compounds, and Lactose   Assessment / Plan:     Visit Diagnoses: Chronic right-sided low back pain with right-sided sciatica-patient complains of lower back pain for the last several years.  As she has been under care of [facility].  She mostly sees Dr. Ramo's and gets lower back injections.  She states she is going to physical therapy for lower back pain.  Fibromyalgia-she was diagnosed with fibromyalgia syndrome several years ago.  She denies any muscle pain.  She had few tender points.  She gives history of chronic fatigue and insomnia.  I will refer her to integrative therapies.  Chronic fatigue syndrome -patient states she has had fatigue since she was in her  mid 30s.  Her fatigue has been gradually getting worse and had been worse in the last 3 years to the point she is  having difficulty walking.  She had extensive workup by Dr. Garnette her cardiologist.  No cause was established.  She states her symptoms are somewhat better since she has been using armodafinil .  Detailed counseling was provided.  Benefit for exercising on a regular basis including water aerobics and swimming was discussed.  Stretching exercises were emphasized.  I will obtain additional labs to evaluate for causes of fatigue.  Plan: CBC with Differential/Platelet, Comprehensive metabolic panel with GFR, CK, TSH, Sedimentation rate, Rheumatoid factor, Cyclic citrul peptide antibody, IgG, ANA, Serum protein electrophoresis with reflex.  I will also refer her to integrative therapies.  Vitamin D  deficiency -she had vitamin D  deficiency in the past.  Check vitamin D  level today.  Plan: VITAMIN D  25 Hydroxy (Vit-D Deficiency, Fractures)  Other ulcerative colitis with other complication (HCC)-is uncertain if she has the diagnosis of ulcerative colitis.  She states there was a suspicion.  Tubular adenoma of colon, colonoscopy 2017, with repeat due 2022, Dr. Kristie  Diverticulosis of colon  Interstitial cystitis-patient states she was diagnosed with interstitial cystitis several years ago.  Association of interstitial cystitis with myofascial pain syndrome was discussed.  History of migraine-association of migraine with myofascial pain syndrome and fatigue was discussed.  Prediabetes  PVC's (premature ventricular contractions)  PAT (paroxysmal atrial tachycardia)  Mitral valve prolapse  Chronic venous insufficiency  Varicose veins of bilateral lower extremities with other complications  Pulmonary nodules  Multiple thyroid  nodules  Seasonal allergic rhinitis due to other allergic trigger  Anxiety and depression  PTSD (post-traumatic stress disorder), followed by Psych  Orders: Orders Placed This Encounter  Procedures   CBC with Differential/Platelet   Comprehensive metabolic panel with  GFR   CK   TSH   Sedimentation rate   VITAMIN D  25 Hydroxy (Vit-D Deficiency, Fractures)   Rheumatoid factor   Cyclic citrul peptide antibody, IgG   ANA   Serum protein electrophoresis with reflex   Ambulatory referral to Physical Therapy   No orders of the defined types were placed in this encounter.    Follow-Up Instructions: Return for Fatigue.   Maya Nash, MD  Note - This record has been created using Animal nutritionist.  Chart creation errors have been sought, but may not always  have been located. Such creation errors do not reflect on  the standard of medical care.     [1]  Social History Tobacco Use   Smoking status: Never    Passive exposure: Never   Smokeless tobacco: Never  Vaping Use   Vaping status: Never Used  Substance Use Topics   Alcohol use: Not Currently   Drug use: No   "

## 2024-06-06 ENCOUNTER — Ambulatory Visit: Attending: Rheumatology | Admitting: Rheumatology

## 2024-06-06 ENCOUNTER — Encounter: Payer: Self-pay | Admitting: Rheumatology

## 2024-06-06 VITALS — BP 109/72 | HR 60 | Temp 97.4°F | Resp 15 | Ht 65.0 in | Wt 191.2 lb

## 2024-06-06 DIAGNOSIS — M2550 Pain in unspecified joint: Secondary | ICD-10-CM

## 2024-06-06 DIAGNOSIS — F419 Anxiety disorder, unspecified: Secondary | ICD-10-CM

## 2024-06-06 DIAGNOSIS — I341 Nonrheumatic mitral (valve) prolapse: Secondary | ICD-10-CM

## 2024-06-06 DIAGNOSIS — G8929 Other chronic pain: Secondary | ICD-10-CM

## 2024-06-06 DIAGNOSIS — E042 Nontoxic multinodular goiter: Secondary | ICD-10-CM

## 2024-06-06 DIAGNOSIS — R918 Other nonspecific abnormal finding of lung field: Secondary | ICD-10-CM

## 2024-06-06 DIAGNOSIS — I10 Essential (primary) hypertension: Secondary | ICD-10-CM

## 2024-06-06 DIAGNOSIS — K51818 Other ulcerative colitis with other complication: Secondary | ICD-10-CM | POA: Diagnosis not present

## 2024-06-06 DIAGNOSIS — M5441 Lumbago with sciatica, right side: Secondary | ICD-10-CM

## 2024-06-06 DIAGNOSIS — F900 Attention-deficit hyperactivity disorder, predominantly inattentive type: Secondary | ICD-10-CM

## 2024-06-06 DIAGNOSIS — I4719 Other supraventricular tachycardia: Secondary | ICD-10-CM | POA: Diagnosis not present

## 2024-06-06 DIAGNOSIS — K5730 Diverticulosis of large intestine without perforation or abscess without bleeding: Secondary | ICD-10-CM | POA: Diagnosis not present

## 2024-06-06 DIAGNOSIS — R7303 Prediabetes: Secondary | ICD-10-CM

## 2024-06-06 DIAGNOSIS — Z8669 Personal history of other diseases of the nervous system and sense organs: Secondary | ICD-10-CM

## 2024-06-06 DIAGNOSIS — D126 Benign neoplasm of colon, unspecified: Secondary | ICD-10-CM

## 2024-06-06 DIAGNOSIS — E559 Vitamin D deficiency, unspecified: Secondary | ICD-10-CM

## 2024-06-06 DIAGNOSIS — F4310 Post-traumatic stress disorder, unspecified: Secondary | ICD-10-CM

## 2024-06-06 DIAGNOSIS — F32A Depression, unspecified: Secondary | ICD-10-CM

## 2024-06-06 DIAGNOSIS — M797 Fibromyalgia: Secondary | ICD-10-CM | POA: Diagnosis not present

## 2024-06-06 DIAGNOSIS — I872 Venous insufficiency (chronic) (peripheral): Secondary | ICD-10-CM | POA: Diagnosis not present

## 2024-06-06 DIAGNOSIS — I493 Ventricular premature depolarization: Secondary | ICD-10-CM | POA: Diagnosis not present

## 2024-06-06 DIAGNOSIS — N3010 Interstitial cystitis (chronic) without hematuria: Secondary | ICD-10-CM

## 2024-06-06 DIAGNOSIS — G9332 Myalgic encephalomyelitis/chronic fatigue syndrome: Secondary | ICD-10-CM

## 2024-06-06 DIAGNOSIS — I2510 Atherosclerotic heart disease of native coronary artery without angina pectoris: Secondary | ICD-10-CM | POA: Insufficient documentation

## 2024-06-06 DIAGNOSIS — J3089 Other allergic rhinitis: Secondary | ICD-10-CM

## 2024-06-06 DIAGNOSIS — I83893 Varicose veins of bilateral lower extremities with other complications: Secondary | ICD-10-CM

## 2024-06-09 ENCOUNTER — Ambulatory Visit: Payer: Self-pay | Admitting: Rheumatology

## 2024-06-09 NOTE — Progress Notes (Signed)
 Please add to ENA panel, C3-C4 anticardiolipin and beta-2 GP 1 if possible.  If the labs cannot be added please have patient come in for the labs.

## 2024-06-10 ENCOUNTER — Ambulatory Visit (HOSPITAL_COMMUNITY)

## 2024-06-10 ENCOUNTER — Other Ambulatory Visit: Payer: Self-pay

## 2024-06-10 DIAGNOSIS — R7689 Other specified abnormal immunological findings in serum: Secondary | ICD-10-CM

## 2024-06-10 LAB — COMPREHENSIVE METABOLIC PANEL WITH GFR
AG Ratio: 1.5 (calc) (ref 1.0–2.5)
ALT: 12 U/L (ref 6–29)
AST: 15 U/L (ref 10–35)
Albumin: 4 g/dL (ref 3.6–5.1)
Alkaline phosphatase (APISO): 74 U/L (ref 37–153)
BUN: 15 mg/dL (ref 7–25)
CO2: 28 mmol/L (ref 20–32)
Calcium: 9.4 mg/dL (ref 8.6–10.4)
Chloride: 106 mmol/L (ref 98–110)
Creat: 0.86 mg/dL (ref 0.50–1.05)
Globulin: 2.6 g/dL (ref 1.9–3.7)
Glucose, Bld: 97 mg/dL (ref 65–99)
Potassium: 4.4 mmol/L (ref 3.5–5.3)
Sodium: 140 mmol/L (ref 135–146)
Total Bilirubin: 0.3 mg/dL (ref 0.2–1.2)
Total Protein: 6.6 g/dL (ref 6.1–8.1)
eGFR: 75 mL/min/{1.73_m2}

## 2024-06-10 LAB — PROTEIN ELECTROPHORESIS, SERUM, WITH REFLEX
Albumin ELP: 3.8 g/dL (ref 3.8–4.8)
Alpha 1: 0.3 g/dL (ref 0.2–0.3)
Alpha 2: 0.7 g/dL (ref 0.5–0.9)
Beta 2: 0.4 g/dL (ref 0.2–0.5)
Beta Globulin: 0.4 g/dL (ref 0.4–0.6)
Gamma Globulin: 0.9 g/dL (ref 0.8–1.7)
Total Protein: 6.5 g/dL (ref 6.1–8.1)

## 2024-06-10 LAB — CBC WITH DIFFERENTIAL/PLATELET
Absolute Lymphocytes: 1902 {cells}/uL (ref 850–3900)
Absolute Monocytes: 511 {cells}/uL (ref 200–950)
Basophils Absolute: 37 {cells}/uL (ref 0–200)
Basophils Relative: 0.5 %
Eosinophils Absolute: 222 {cells}/uL (ref 15–500)
Eosinophils Relative: 3 %
HCT: 41.3 % (ref 35.9–46.0)
Hemoglobin: 13.1 g/dL (ref 11.7–15.5)
MCH: 28.2 pg (ref 27.0–33.0)
MCHC: 31.7 g/dL (ref 31.6–35.4)
MCV: 88.8 fL (ref 81.4–101.7)
MPV: 9.8 fL (ref 7.5–12.5)
Monocytes Relative: 6.9 %
Neutro Abs: 4729 {cells}/uL (ref 1500–7800)
Neutrophils Relative %: 63.9 %
Platelets: 270 10*3/uL (ref 140–400)
RBC: 4.65 Million/uL (ref 3.80–5.10)
RDW: 14.5 % (ref 11.0–15.0)
Total Lymphocyte: 25.7 %
WBC: 7.4 10*3/uL (ref 3.8–10.8)

## 2024-06-10 LAB — ANTI-NUCLEAR AB-TITER (ANA TITER): ANA Titer 1: 1:640 {titer} — ABNORMAL HIGH

## 2024-06-10 LAB — RHEUMATOID FACTOR: Rheumatoid fact SerPl-aCnc: <10 [IU]/mL

## 2024-06-10 LAB — VITAMIN D 25 HYDROXY (VIT D DEFICIENCY, FRACTURES): Vit D, 25-Hydroxy: 79 ng/mL (ref 30–100)

## 2024-06-10 LAB — CYCLIC CITRUL PEPTIDE ANTIBODY, IGG: Cyclic Citrullin Peptide Ab: <16 U

## 2024-06-10 LAB — CK: Total CK: 60 U/L (ref 20–243)

## 2024-06-10 LAB — TSH: TSH: 1.62 m[IU]/L (ref 0.40–4.50)

## 2024-06-10 LAB — ANA: Anti Nuclear Antibody (ANA): POSITIVE — AB

## 2024-06-10 LAB — SEDIMENTATION RATE: Sed Rate: 14 mm/h (ref 0–30)

## 2024-06-10 NOTE — Progress Notes (Signed)
 SABRA

## 2024-06-11 NOTE — Progress Notes (Signed)
 CBC and CMP are normal TSH normal, sed rate normal, vitamin D  normal, rheumatoid factor negative, anti-CCP negative, SPEP normal.  ANA positive, ENA panel, C3-C4 are pending.

## 2024-06-12 ENCOUNTER — Other Ambulatory Visit: Payer: Self-pay

## 2024-06-12 DIAGNOSIS — R7689 Other specified abnormal immunological findings in serum: Secondary | ICD-10-CM

## 2024-06-14 LAB — CARDIOLIPIN ANTIBODIES, IGG, IGM, IGA
Anticardiolipin IgA: <2 [APL'U]/mL
Anticardiolipin IgG: <2 [GPL'U]/mL
Anticardiolipin IgM: <2 [MPL'U]/mL

## 2024-06-14 LAB — ANTI-SCLERODERMA ANTIBODY: Scleroderma (Scl-70) (ENA) Antibody, IgG: <1 AI

## 2024-06-14 LAB — SJOGRENS SYNDROME-A EXTRACTABLE NUCLEAR ANTIBODY: SSA (Ro) (ENA) Antibody, IgG: <1 AI

## 2024-06-14 LAB — C3 AND C4
C3 Complement: 165 mg/dL (ref 83–193)
C4 Complement: 34 mg/dL (ref 15–57)

## 2024-06-14 LAB — ANTI-DNA ANTIBODY, DOUBLE-STRANDED: ds DNA Ab: <1 [IU]/mL

## 2024-06-14 LAB — ANTI-SMITH ANTIBODY: ENA SM Ab Ser-aCnc: <1 AI

## 2024-06-14 LAB — RNP ANTIBODY: Ribonucleic Protein(ENA) Antibody, IgG: <1 AI

## 2024-06-14 LAB — BETA-2 GLYCOPROTEIN ANTIBODIES
Beta-2 Glyco 1 IgA: <2 U/mL
Beta-2 Glyco 1 IgM: <2 U/mL
Beta-2 Glyco I IgG: <2 U/mL

## 2024-06-14 LAB — SJOGRENS SYNDROME-B EXTRACTABLE NUCLEAR ANTIBODY: SSB (La) (ENA) Antibody, IgG: <1 AI

## 2024-06-16 ENCOUNTER — Ambulatory Visit: Payer: Self-pay | Admitting: Rheumatology

## 2024-06-16 NOTE — Progress Notes (Signed)
All the labs are within normal limits.  I will discuss results at the follow-up visit.

## 2024-06-19 ENCOUNTER — Encounter (INDEPENDENT_AMBULATORY_CARE_PROVIDER_SITE_OTHER): Payer: Self-pay | Admitting: Otolaryngology

## 2024-06-19 ENCOUNTER — Ambulatory Visit (INDEPENDENT_AMBULATORY_CARE_PROVIDER_SITE_OTHER): Admitting: Otolaryngology

## 2024-06-19 VITALS — BP 113/73 | HR 66

## 2024-06-19 DIAGNOSIS — J323 Chronic sphenoidal sinusitis: Secondary | ICD-10-CM

## 2024-06-19 DIAGNOSIS — J343 Hypertrophy of nasal turbinates: Secondary | ICD-10-CM

## 2024-06-19 DIAGNOSIS — J209 Acute bronchitis, unspecified: Secondary | ICD-10-CM

## 2024-06-19 DIAGNOSIS — J342 Deviated nasal septum: Secondary | ICD-10-CM

## 2024-06-19 MED ORDER — AZITHROMYCIN 250 MG PO TABS: ORAL_TABLET | ORAL | 0 refills | Status: AC

## 2024-06-19 NOTE — Progress Notes (Unsigned)
 Patient ID: Marisa Hamilton, female   DOB: 03/01/1961, 64 y.o.   MRN: [id number]  Follow up: Chronic rhinosinusitis, headaches, chronic nasal obstruction  History of Present Illness Marisa Hamilton is a 64 year old female with chronic nasal obstruction, septal deviation, and recurrent sinus infections who presents with persistent nasal symptoms.  She reports longstanding, frequently recurrent nasal congestion with significant difficulty breathing through her nose, describing the sensation as breathing through a tiny little slit and a little tiny straw. Congestion is present chronically and is easily triggered or worsened by minor irritants such as dust or pollen. She previously used fluticasone  nasal spray without benefit. She underwent septoplasty in her thirties for septal deviation but continues to have persistent symptoms. She also describes frequent episodes of perceived sinus infections or colds.  She experiences intermittent facial pain and headaches, with pain today localized to the frontal region. Facial pain is infrequent and episodic. Migraines are well controlled with topiramate . She has had several recent respiratory illnesses, including a sinus infection in November 2025 treated with antibiotics, COVID-19 in December 2025 treated with Paxlovid, and a current illness ongoing for about a month characterized by persistent cough and chest congestion. Her current cough is productive of yellow-green mucus and she notes exertional dyspnea. She denies significant severity of her COVID-19 illness and has not taken antibiotics for her current illness.  Her sinus CT scan in December 2025 showed chronic right sphenoid sinusitis, severe nasal septal deviation, and bilateral inferior turbinate hypertrophy.  Exam: General: Communicates without difficulty, well nourished, no acute distress. Head: Normocephalic, no evidence injury, no tenderness, facial buttresses intact without stepoff.  Face/sinus: No tenderness to palpation and percussion. Facial movement is normal and symmetric. Eyes: PERRL, EOMI. No scleral icterus, conjunctivae clear. Neuro: CN II exam reveals vision grossly intact.  No nystagmus at any point of gaze. Ears: Auricles well formed without lesions.  Ear canals are intact without mass or lesion.  No erythema or edema is appreciated.  The TMs are intact without fluid. Nose: External evaluation reveals normal support and skin without lesions.  Dorsum is intact.  Anterior rhinoscopy reveals congested mucosa over anterior aspect of inferior turbinates and deviated septum.  No purulence noted. Oral:  Oral cavity and oropharynx are intact, symmetric, without erythema or edema.  Mucosa is moist without lesions. Neck: Full range of motion without pain.  There is no significant lymphadenopathy.  No masses palpable.  Thyroid  bed within normal limits to palpation.  Parotid glands and submandibular glands equal bilaterally without mass.  Trachea is midline. Neuro:  CN 2-12 grossly intact.   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 Symptoms are refractory to medical management, including steroid nasal spray, nasal saline irrigation, and allergy  medications.  - The physical exam findings and CT images are extensively reviewed with the patient. -The treatment options are discussed.  Options include continuing conservative observation with medical therapy versus surgical intervention with septoplasty, bilateral inferior turbinate reduction, and endoscopic sinus surgery.  The risk, benefits, alternatives, and details of the procedures are extensively reviewed.  Questions are invited and answered. - Explained procedure details, anticipated benefits, risks, and postoperative  expectations. -The patient is interested in proceeding with the surgical procedures.  Chronic right sphenoid sinusitis Imaging demonstrated chronic disease isolated to the right sphenoid sinus, with all other sinuses clear. Chronic sphenoid  sinusitis likely contributes to her sinonasal symptom burden. N - Planned surgical intervention to open and debride the right sphenoid sinus during the same procedure as septoplasty and turbinate reduction.  Acute bronchitis She presents with persistent cough, yellow-green sputum production, and dyspnea consistent with acute bronchitis. Symptoms have persisted for approximately one month. - Prescribed azithromycin  (Z-Pak) for presumed bacterial bronchitis. - Sent prescription to her preferred pharmacy.

## 2024-06-24 ENCOUNTER — Encounter: Admitting: Rheumatology

## 2024-06-27 ENCOUNTER — Encounter: Admitting: Rheumatology

## 2024-07-16 ENCOUNTER — Ambulatory Visit: Payer: Self-pay | Admitting: Obstetrics and Gynecology

## 2024-07-17 ENCOUNTER — Ambulatory Visit (HOSPITAL_COMMUNITY)

## 2024-07-24 ENCOUNTER — Ambulatory Visit: Admitting: Rheumatology

## 2024-08-18 ENCOUNTER — Ambulatory Visit: Payer: Self-pay | Admitting: Obstetrics and Gynecology

## 2024-08-25 ENCOUNTER — Ambulatory Visit: Admitting: Psychiatry
